# Patient Record
Sex: Female | Born: 1960 | Race: White | Hispanic: No | Smoking: Current every day smoker
Health system: Southern US, Community
[De-identification: ages and names within clinical notes are randomized; demographics above are authoritative.]

## PROBLEM LIST (undated history)

## (undated) DIAGNOSIS — S91301A Unspecified open wound, right foot, initial encounter: Secondary | ICD-10-CM

## (undated) DIAGNOSIS — M1990 Unspecified osteoarthritis, unspecified site: Secondary | ICD-10-CM

## (undated) DIAGNOSIS — L97509 Non-pressure chronic ulcer of other part of unspecified foot with unspecified severity: Secondary | ICD-10-CM

## (undated) DIAGNOSIS — F419 Anxiety disorder, unspecified: Secondary | ICD-10-CM

## (undated) DIAGNOSIS — E11621 Type 2 diabetes mellitus with foot ulcer: Secondary | ICD-10-CM

## (undated) DIAGNOSIS — I10 Essential (primary) hypertension: Secondary | ICD-10-CM

## (undated) DIAGNOSIS — B958 Unspecified staphylococcus as the cause of diseases classified elsewhere: Secondary | ICD-10-CM

## (undated) DIAGNOSIS — E119 Type 2 diabetes mellitus without complications: Secondary | ICD-10-CM

## (undated) DIAGNOSIS — N159 Renal tubulo-interstitial disease, unspecified: Secondary | ICD-10-CM

## (undated) HISTORY — PX: JOINT REPLACEMENT: SHX530

## (undated) HISTORY — DX: Type 2 diabetes mellitus with foot ulcer: E11.621

## (undated) HISTORY — DX: Essential (primary) hypertension: I10

## (undated) HISTORY — DX: Anxiety disorder, unspecified: F41.9

## (undated) HISTORY — PX: ABDOMINAL ADHESION SURGERY: SHX90

## (undated) HISTORY — DX: Type 2 diabetes mellitus without complications: E11.9

## (undated) HISTORY — DX: Renal tubulo-interstitial disease, unspecified: N15.9

## (undated) HISTORY — PX: TONSILLECTOMY: SUR1361

## (undated) HISTORY — DX: Unspecified open wound, right foot, initial encounter: S91.301A

## (undated) HISTORY — DX: Non-pressure chronic ulcer of other part of unspecified foot with unspecified severity: L97.509

## (undated) HISTORY — PX: FOOT SURGERY: SHX648

## (undated) HISTORY — PX: CARPAL TUNNEL RELEASE: SHX101

## (undated) HISTORY — DX: Unspecified osteoarthritis, unspecified site: M19.90

---

## 1978-05-29 HISTORY — PX: TONSILLECTOMY: SHX5217

## 1979-05-30 HISTORY — PX: CHOLECYSTECTOMY: SHX55

## 1996-05-29 HISTORY — PX: TOTAL ABDOMINAL HYSTERECTOMY: SHX209

## 1996-05-29 HISTORY — PX: ABDOMINAL HYSTERECTOMY: SHX81

## 2001-06-04 HISTORY — PX: OTHER SURGICAL HISTORY: SHX169

## 2004-03-10 ENCOUNTER — Ambulatory Visit: Payer: Self-pay | Admitting: Pain Medicine

## 2004-04-05 ENCOUNTER — Ambulatory Visit: Payer: Self-pay | Admitting: Pain Medicine

## 2004-05-03 ENCOUNTER — Ambulatory Visit: Payer: Self-pay | Admitting: Pain Medicine

## 2004-06-07 ENCOUNTER — Ambulatory Visit: Payer: Self-pay | Admitting: Pain Medicine

## 2004-06-17 ENCOUNTER — Emergency Department: Payer: Self-pay | Admitting: Emergency Medicine

## 2004-07-05 ENCOUNTER — Ambulatory Visit: Payer: Self-pay | Admitting: Pain Medicine

## 2004-07-13 ENCOUNTER — Ambulatory Visit: Payer: Self-pay | Admitting: Pain Medicine

## 2004-08-02 ENCOUNTER — Ambulatory Visit: Payer: Self-pay | Admitting: Pain Medicine

## 2004-08-30 ENCOUNTER — Ambulatory Visit: Payer: Self-pay | Admitting: Pain Medicine

## 2004-09-29 ENCOUNTER — Ambulatory Visit: Payer: Self-pay | Admitting: Pain Medicine

## 2004-10-26 ENCOUNTER — Ambulatory Visit: Payer: Self-pay | Admitting: Pain Medicine

## 2004-11-24 ENCOUNTER — Ambulatory Visit: Payer: Self-pay | Admitting: Pain Medicine

## 2004-12-27 ENCOUNTER — Ambulatory Visit: Payer: Self-pay | Admitting: Pain Medicine

## 2005-01-19 ENCOUNTER — Ambulatory Visit: Payer: Self-pay | Admitting: Pain Medicine

## 2005-02-23 ENCOUNTER — Ambulatory Visit: Payer: Self-pay | Admitting: Pain Medicine

## 2005-03-09 ENCOUNTER — Ambulatory Visit: Payer: Self-pay | Admitting: Pain Medicine

## 2005-04-18 ENCOUNTER — Ambulatory Visit: Payer: Self-pay | Admitting: Pain Medicine

## 2005-05-16 ENCOUNTER — Ambulatory Visit: Payer: Self-pay | Admitting: Pain Medicine

## 2005-06-13 ENCOUNTER — Ambulatory Visit: Payer: Self-pay | Admitting: Pain Medicine

## 2005-06-25 ENCOUNTER — Emergency Department: Payer: Self-pay | Admitting: Emergency Medicine

## 2005-07-13 ENCOUNTER — Ambulatory Visit: Payer: Self-pay | Admitting: Pain Medicine

## 2005-08-10 ENCOUNTER — Ambulatory Visit: Payer: Self-pay | Admitting: Pain Medicine

## 2005-09-05 ENCOUNTER — Ambulatory Visit: Payer: Self-pay | Admitting: Pain Medicine

## 2005-10-05 ENCOUNTER — Ambulatory Visit: Payer: Self-pay | Admitting: Pain Medicine

## 2005-11-02 ENCOUNTER — Ambulatory Visit: Payer: Self-pay | Admitting: Pain Medicine

## 2005-12-05 ENCOUNTER — Ambulatory Visit: Payer: Self-pay | Admitting: Pain Medicine

## 2005-12-28 ENCOUNTER — Ambulatory Visit: Payer: Self-pay | Admitting: Pain Medicine

## 2006-02-01 ENCOUNTER — Ambulatory Visit: Payer: Self-pay | Admitting: Pain Medicine

## 2006-02-20 ENCOUNTER — Ambulatory Visit: Payer: Self-pay | Admitting: Pain Medicine

## 2006-03-29 ENCOUNTER — Ambulatory Visit: Payer: Self-pay | Admitting: Pain Medicine

## 2006-05-01 ENCOUNTER — Ambulatory Visit: Payer: Self-pay | Admitting: Pain Medicine

## 2006-05-31 ENCOUNTER — Ambulatory Visit: Payer: Self-pay | Admitting: Pain Medicine

## 2006-06-26 ENCOUNTER — Ambulatory Visit: Payer: Self-pay | Admitting: Pain Medicine

## 2006-07-26 ENCOUNTER — Ambulatory Visit: Payer: Self-pay | Admitting: Pain Medicine

## 2006-08-23 ENCOUNTER — Ambulatory Visit: Payer: Self-pay | Admitting: Pain Medicine

## 2006-09-25 ENCOUNTER — Ambulatory Visit: Payer: Self-pay | Admitting: Pain Medicine

## 2006-10-23 ENCOUNTER — Ambulatory Visit: Payer: Self-pay | Admitting: Pain Medicine

## 2006-11-22 ENCOUNTER — Ambulatory Visit: Payer: Self-pay | Admitting: Pain Medicine

## 2006-12-18 ENCOUNTER — Ambulatory Visit: Payer: Self-pay | Admitting: Pain Medicine

## 2007-01-17 ENCOUNTER — Ambulatory Visit: Payer: Self-pay | Admitting: Pain Medicine

## 2007-02-21 ENCOUNTER — Ambulatory Visit: Payer: Self-pay | Admitting: Pain Medicine

## 2007-03-19 ENCOUNTER — Ambulatory Visit: Payer: Self-pay | Admitting: Pain Medicine

## 2007-04-23 ENCOUNTER — Ambulatory Visit: Payer: Self-pay | Admitting: Pain Medicine

## 2007-05-28 ENCOUNTER — Ambulatory Visit: Payer: Self-pay | Admitting: Pain Medicine

## 2007-06-27 ENCOUNTER — Ambulatory Visit: Payer: Self-pay | Admitting: Pain Medicine

## 2007-07-07 ENCOUNTER — Other Ambulatory Visit: Payer: Self-pay

## 2007-07-07 ENCOUNTER — Emergency Department: Payer: Self-pay | Admitting: Emergency Medicine

## 2007-07-10 ENCOUNTER — Emergency Department: Payer: Self-pay | Admitting: Emergency Medicine

## 2007-07-25 ENCOUNTER — Ambulatory Visit: Payer: Self-pay | Admitting: Pain Medicine

## 2007-08-22 ENCOUNTER — Ambulatory Visit: Payer: Self-pay | Admitting: Pain Medicine

## 2007-09-26 ENCOUNTER — Ambulatory Visit: Payer: Self-pay | Admitting: Pain Medicine

## 2007-10-22 ENCOUNTER — Ambulatory Visit: Payer: Self-pay | Admitting: Pain Medicine

## 2007-10-30 ENCOUNTER — Ambulatory Visit: Payer: Self-pay | Admitting: Pain Medicine

## 2007-11-13 ENCOUNTER — Ambulatory Visit: Payer: Self-pay | Admitting: Pain Medicine

## 2007-11-21 ENCOUNTER — Ambulatory Visit: Payer: Self-pay | Admitting: Pain Medicine

## 2007-12-24 ENCOUNTER — Ambulatory Visit: Payer: Self-pay | Admitting: Pain Medicine

## 2008-01-21 ENCOUNTER — Ambulatory Visit: Payer: Self-pay | Admitting: Pain Medicine

## 2008-02-20 ENCOUNTER — Ambulatory Visit: Payer: Self-pay | Admitting: Pain Medicine

## 2008-02-22 ENCOUNTER — Emergency Department: Payer: Self-pay | Admitting: Emergency Medicine

## 2008-03-24 ENCOUNTER — Ambulatory Visit: Payer: Self-pay | Admitting: Pain Medicine

## 2008-04-21 ENCOUNTER — Ambulatory Visit: Payer: Self-pay | Admitting: Pain Medicine

## 2008-05-19 ENCOUNTER — Ambulatory Visit: Payer: Self-pay | Admitting: Pain Medicine

## 2008-06-18 ENCOUNTER — Ambulatory Visit: Payer: Self-pay | Admitting: Pain Medicine

## 2008-07-21 ENCOUNTER — Ambulatory Visit: Payer: Self-pay | Admitting: Pain Medicine

## 2008-08-18 ENCOUNTER — Ambulatory Visit: Payer: Self-pay | Admitting: Pain Medicine

## 2008-09-17 ENCOUNTER — Ambulatory Visit: Payer: Self-pay | Admitting: Pain Medicine

## 2008-10-13 ENCOUNTER — Ambulatory Visit: Payer: Self-pay | Admitting: Pain Medicine

## 2008-11-12 ENCOUNTER — Ambulatory Visit: Payer: Self-pay | Admitting: Pain Medicine

## 2008-12-15 ENCOUNTER — Ambulatory Visit: Payer: Self-pay | Admitting: Pain Medicine

## 2009-01-14 ENCOUNTER — Ambulatory Visit: Payer: Self-pay | Admitting: Pain Medicine

## 2009-02-16 ENCOUNTER — Ambulatory Visit: Payer: Self-pay | Admitting: Pain Medicine

## 2009-03-11 ENCOUNTER — Ambulatory Visit: Payer: Self-pay | Admitting: Pain Medicine

## 2009-04-05 ENCOUNTER — Emergency Department: Payer: Self-pay | Admitting: Emergency Medicine

## 2009-04-13 ENCOUNTER — Ambulatory Visit: Payer: Self-pay | Admitting: Pain Medicine

## 2009-05-11 ENCOUNTER — Ambulatory Visit: Payer: Self-pay | Admitting: Pain Medicine

## 2009-06-10 ENCOUNTER — Ambulatory Visit: Payer: Self-pay | Admitting: Pain Medicine

## 2009-07-08 ENCOUNTER — Ambulatory Visit: Payer: Self-pay | Admitting: Pain Medicine

## 2009-08-04 ENCOUNTER — Ambulatory Visit: Payer: Self-pay | Admitting: Pain Medicine

## 2009-09-02 ENCOUNTER — Ambulatory Visit: Payer: Self-pay | Admitting: Pain Medicine

## 2009-10-05 ENCOUNTER — Ambulatory Visit: Payer: Self-pay | Admitting: Pain Medicine

## 2009-10-09 ENCOUNTER — Emergency Department: Payer: Self-pay | Admitting: Emergency Medicine

## 2009-11-02 ENCOUNTER — Ambulatory Visit: Payer: Self-pay | Admitting: Pain Medicine

## 2009-12-02 ENCOUNTER — Ambulatory Visit: Payer: Self-pay | Admitting: Pain Medicine

## 2009-12-30 ENCOUNTER — Ambulatory Visit: Payer: Self-pay | Admitting: Pain Medicine

## 2010-01-27 ENCOUNTER — Emergency Department: Payer: Self-pay | Admitting: Emergency Medicine

## 2010-01-27 ENCOUNTER — Ambulatory Visit: Payer: Self-pay | Admitting: Pain Medicine

## 2010-03-01 ENCOUNTER — Ambulatory Visit: Payer: Self-pay | Admitting: Pain Medicine

## 2010-03-25 ENCOUNTER — Emergency Department: Payer: Self-pay | Admitting: Emergency Medicine

## 2010-03-31 ENCOUNTER — Ambulatory Visit: Payer: Self-pay | Admitting: Pain Medicine

## 2010-04-28 ENCOUNTER — Ambulatory Visit: Payer: Self-pay | Admitting: Pain Medicine

## 2010-05-31 ENCOUNTER — Ambulatory Visit: Payer: Self-pay | Admitting: Pain Medicine

## 2010-06-30 ENCOUNTER — Ambulatory Visit: Payer: Self-pay | Admitting: Pain Medicine

## 2010-07-28 ENCOUNTER — Ambulatory Visit: Payer: Self-pay | Admitting: Pain Medicine

## 2010-08-30 ENCOUNTER — Ambulatory Visit: Payer: Self-pay | Admitting: Pain Medicine

## 2010-09-29 ENCOUNTER — Ambulatory Visit: Payer: Self-pay | Admitting: Pain Medicine

## 2010-10-27 ENCOUNTER — Ambulatory Visit: Payer: Self-pay | Admitting: Pain Medicine

## 2010-11-29 ENCOUNTER — Ambulatory Visit: Payer: Self-pay | Admitting: Pain Medicine

## 2010-12-27 ENCOUNTER — Ambulatory Visit: Payer: Self-pay | Admitting: Pain Medicine

## 2011-01-26 ENCOUNTER — Ambulatory Visit: Payer: Self-pay | Admitting: Pain Medicine

## 2011-02-23 ENCOUNTER — Ambulatory Visit: Payer: Self-pay | Admitting: Pain Medicine

## 2011-03-28 ENCOUNTER — Ambulatory Visit: Payer: Self-pay | Admitting: Pain Medicine

## 2011-04-27 ENCOUNTER — Ambulatory Visit: Payer: Self-pay | Admitting: Pain Medicine

## 2011-05-16 ENCOUNTER — Ambulatory Visit: Payer: Self-pay | Admitting: Pain Medicine

## 2011-06-22 ENCOUNTER — Ambulatory Visit: Payer: Self-pay | Admitting: Pain Medicine

## 2011-07-25 ENCOUNTER — Ambulatory Visit: Payer: Self-pay | Admitting: Pain Medicine

## 2011-08-22 ENCOUNTER — Ambulatory Visit: Payer: Self-pay | Admitting: Pain Medicine

## 2011-08-27 ENCOUNTER — Emergency Department: Payer: Self-pay | Admitting: Emergency Medicine

## 2011-08-27 LAB — URINALYSIS, COMPLETE
Bilirubin,UR: NEGATIVE
Blood: NEGATIVE
Glucose,UR: 50 mg/dL (ref 0–75)
Leukocyte Esterase: NEGATIVE
Nitrite: NEGATIVE
Protein: 25
RBC,UR: 3 /HPF (ref 0–5)
Squamous Epithelial: 1
WBC UR: 1 /HPF (ref 0–5)

## 2011-09-21 ENCOUNTER — Ambulatory Visit: Payer: Self-pay | Admitting: Pain Medicine

## 2011-10-16 ENCOUNTER — Emergency Department: Payer: Self-pay | Admitting: *Deleted

## 2011-10-16 LAB — URINALYSIS, COMPLETE
Bilirubin,UR: NEGATIVE
Glucose,UR: NEGATIVE mg/dL (ref 0–75)
Nitrite: NEGATIVE
Ph: 8 (ref 4.5–8.0)
Protein: 30
Specific Gravity: 1.01 (ref 1.003–1.030)
WBC UR: 52 /HPF (ref 0–5)

## 2011-10-19 ENCOUNTER — Ambulatory Visit: Payer: Self-pay | Admitting: Pain Medicine

## 2011-11-21 ENCOUNTER — Ambulatory Visit: Payer: Self-pay | Admitting: Pain Medicine

## 2011-12-21 ENCOUNTER — Ambulatory Visit: Payer: Self-pay | Admitting: Pain Medicine

## 2012-01-18 ENCOUNTER — Ambulatory Visit: Payer: Self-pay | Admitting: Pain Medicine

## 2012-02-06 ENCOUNTER — Inpatient Hospital Stay: Payer: Self-pay | Admitting: Internal Medicine

## 2012-02-06 DIAGNOSIS — Z888 Allergy status to other drugs, medicaments and biological substances status: Secondary | ICD-10-CM | POA: Diagnosis not present

## 2012-02-06 DIAGNOSIS — Z9889 Other specified postprocedural states: Secondary | ICD-10-CM | POA: Diagnosis not present

## 2012-02-06 DIAGNOSIS — M47817 Spondylosis without myelopathy or radiculopathy, lumbosacral region: Secondary | ICD-10-CM | POA: Diagnosis present

## 2012-02-06 DIAGNOSIS — M169 Osteoarthritis of hip, unspecified: Secondary | ICD-10-CM | POA: Diagnosis present

## 2012-02-06 DIAGNOSIS — G894 Chronic pain syndrome: Secondary | ICD-10-CM | POA: Diagnosis present

## 2012-02-06 DIAGNOSIS — D72829 Elevated white blood cell count, unspecified: Secondary | ICD-10-CM | POA: Diagnosis present

## 2012-02-06 DIAGNOSIS — A498 Other bacterial infections of unspecified site: Secondary | ICD-10-CM | POA: Diagnosis not present

## 2012-02-06 DIAGNOSIS — N390 Urinary tract infection, site not specified: Secondary | ICD-10-CM | POA: Diagnosis not present

## 2012-02-06 DIAGNOSIS — Z882 Allergy status to sulfonamides status: Secondary | ICD-10-CM | POA: Diagnosis not present

## 2012-02-06 DIAGNOSIS — Z7982 Long term (current) use of aspirin: Secondary | ICD-10-CM | POA: Diagnosis not present

## 2012-02-06 DIAGNOSIS — Z79899 Other long term (current) drug therapy: Secondary | ICD-10-CM | POA: Diagnosis not present

## 2012-02-06 DIAGNOSIS — R002 Palpitations: Secondary | ICD-10-CM | POA: Diagnosis present

## 2012-02-06 DIAGNOSIS — E669 Obesity, unspecified: Secondary | ICD-10-CM | POA: Diagnosis not present

## 2012-02-06 DIAGNOSIS — Z794 Long term (current) use of insulin: Secondary | ICD-10-CM | POA: Diagnosis not present

## 2012-02-06 DIAGNOSIS — E119 Type 2 diabetes mellitus without complications: Secondary | ICD-10-CM | POA: Diagnosis not present

## 2012-02-06 DIAGNOSIS — F17200 Nicotine dependence, unspecified, uncomplicated: Secondary | ICD-10-CM | POA: Diagnosis present

## 2012-02-06 DIAGNOSIS — Z833 Family history of diabetes mellitus: Secondary | ICD-10-CM | POA: Diagnosis not present

## 2012-02-06 DIAGNOSIS — M1710 Unilateral primary osteoarthritis, unspecified knee: Secondary | ICD-10-CM | POA: Diagnosis present

## 2012-02-06 DIAGNOSIS — Z836 Family history of other diseases of the respiratory system: Secondary | ICD-10-CM | POA: Diagnosis not present

## 2012-02-06 DIAGNOSIS — Z96649 Presence of unspecified artificial hip joint: Secondary | ICD-10-CM | POA: Diagnosis not present

## 2012-02-06 DIAGNOSIS — Z90710 Acquired absence of both cervix and uterus: Secondary | ICD-10-CM | POA: Diagnosis not present

## 2012-02-06 DIAGNOSIS — E785 Hyperlipidemia, unspecified: Secondary | ICD-10-CM | POA: Diagnosis not present

## 2012-02-06 DIAGNOSIS — Z6838 Body mass index (BMI) 38.0-38.9, adult: Secondary | ICD-10-CM | POA: Diagnosis not present

## 2012-02-06 DIAGNOSIS — I248 Other forms of acute ischemic heart disease: Secondary | ICD-10-CM | POA: Diagnosis not present

## 2012-02-06 DIAGNOSIS — R112 Nausea with vomiting, unspecified: Secondary | ICD-10-CM | POA: Diagnosis not present

## 2012-02-06 LAB — COMPREHENSIVE METABOLIC PANEL
Albumin: 3.5 g/dL (ref 3.4–5.0)
BUN: 11 mg/dL (ref 7–18)
Calcium, Total: 9.1 mg/dL (ref 8.5–10.1)
Chloride: 101 mmol/L (ref 98–107)
Co2: 30 mmol/L (ref 21–32)
EGFR (African American): >60
Glucose: 185 mg/dL — ABNORMAL HIGH (ref 65–99)
Potassium: 3.9 mmol/L (ref 3.5–5.1)
SGOT(AST): 17 U/L (ref 15–37)
SGPT (ALT): 25 U/L (ref 12–78)
Sodium: 139 mmol/L (ref 136–145)
Total Protein: 8.3 g/dL — ABNORMAL HIGH (ref 6.4–8.2)

## 2012-02-06 LAB — CBC
HCT: 47.5 % — ABNORMAL HIGH (ref 35.0–47.0)
MCH: 27.6 pg (ref 26.0–34.0)
MCV: 83 fL (ref 80–100)
Platelet: 151 10*3/uL (ref 150–440)
RDW: 15.7 % — ABNORMAL HIGH (ref 11.5–14.5)
WBC: 13.3 10*3/uL — ABNORMAL HIGH (ref 3.6–11.0)

## 2012-02-06 LAB — LIPASE, BLOOD: Lipase: 43 U/L — ABNORMAL LOW (ref 73–393)

## 2012-02-06 LAB — URINALYSIS, COMPLETE
Ketone: NEGATIVE
Nitrite: NEGATIVE
RBC,UR: 3 /HPF (ref 0–5)
Squamous Epithelial: 5
WBC UR: 942 /HPF (ref 0–5)

## 2012-02-06 LAB — TROPONIN I: Troponin-I: 0.12 ng/mL — ABNORMAL HIGH

## 2012-02-07 DIAGNOSIS — R079 Chest pain, unspecified: Secondary | ICD-10-CM

## 2012-02-07 DIAGNOSIS — R748 Abnormal levels of other serum enzymes: Secondary | ICD-10-CM

## 2012-02-07 LAB — LIPID PANEL
Cholesterol: 150 mg/dL (ref 0–200)
HDL Cholesterol: 14 mg/dL — ABNORMAL LOW (ref 40–60)
Ldl Cholesterol, Calc: 80 mg/dL (ref 0–100)
Triglycerides: 279 mg/dL — ABNORMAL HIGH (ref 0–200)
VLDL Cholesterol, Calc: 56 mg/dL — ABNORMAL HIGH (ref 5–40)

## 2012-02-07 LAB — TROPONIN I
Troponin-I: 0.12 ng/mL — ABNORMAL HIGH
Troponin-I: 0.1 ng/mL — ABNORMAL HIGH

## 2012-02-07 LAB — CK TOTAL AND CKMB (NOT AT ARMC): CK, Total: 89 U/L (ref 21–215)

## 2012-02-07 LAB — APTT: Activated PTT: 54.7 secs — ABNORMAL HIGH (ref 23.6–35.9)

## 2012-02-08 LAB — URINE CULTURE

## 2012-02-20 ENCOUNTER — Ambulatory Visit: Payer: Self-pay | Admitting: Pain Medicine

## 2012-03-21 ENCOUNTER — Ambulatory Visit: Payer: Self-pay | Admitting: Pain Medicine

## 2012-04-18 ENCOUNTER — Ambulatory Visit: Payer: Self-pay | Admitting: Pain Medicine

## 2012-04-29 ENCOUNTER — Emergency Department: Payer: Self-pay | Admitting: Emergency Medicine

## 2012-05-16 ENCOUNTER — Ambulatory Visit: Payer: Self-pay | Admitting: Pain Medicine

## 2012-06-18 ENCOUNTER — Ambulatory Visit: Payer: Self-pay | Admitting: Pain Medicine

## 2012-07-01 ENCOUNTER — Emergency Department: Payer: Self-pay | Admitting: Emergency Medicine

## 2012-07-18 ENCOUNTER — Ambulatory Visit: Payer: Self-pay | Admitting: Pain Medicine

## 2012-08-13 ENCOUNTER — Ambulatory Visit: Payer: Self-pay | Admitting: Pain Medicine

## 2012-08-13 DIAGNOSIS — IMO0002 Reserved for concepts with insufficient information to code with codable children: Secondary | ICD-10-CM | POA: Diagnosis not present

## 2012-08-13 DIAGNOSIS — S83289A Other tear of lateral meniscus, current injury, unspecified knee, initial encounter: Secondary | ICD-10-CM | POA: Diagnosis not present

## 2012-08-13 DIAGNOSIS — M47817 Spondylosis without myelopathy or radiculopathy, lumbosacral region: Secondary | ICD-10-CM | POA: Diagnosis not present

## 2012-08-13 DIAGNOSIS — E1149 Type 2 diabetes mellitus with other diabetic neurological complication: Secondary | ICD-10-CM | POA: Diagnosis not present

## 2012-08-13 DIAGNOSIS — E1142 Type 2 diabetes mellitus with diabetic polyneuropathy: Secondary | ICD-10-CM | POA: Diagnosis not present

## 2012-08-13 DIAGNOSIS — M2240 Chondromalacia patellae, unspecified knee: Secondary | ICD-10-CM | POA: Diagnosis not present

## 2012-08-13 DIAGNOSIS — M25569 Pain in unspecified knee: Secondary | ICD-10-CM | POA: Diagnosis not present

## 2012-08-13 DIAGNOSIS — Z79899 Other long term (current) drug therapy: Secondary | ICD-10-CM | POA: Diagnosis not present

## 2012-08-13 DIAGNOSIS — M79609 Pain in unspecified limb: Secondary | ICD-10-CM | POA: Diagnosis not present

## 2012-08-13 DIAGNOSIS — Z96649 Presence of unspecified artificial hip joint: Secondary | ICD-10-CM | POA: Diagnosis not present

## 2012-08-27 DIAGNOSIS — R002 Palpitations: Secondary | ICD-10-CM | POA: Diagnosis not present

## 2012-08-27 DIAGNOSIS — J019 Acute sinusitis, unspecified: Secondary | ICD-10-CM | POA: Diagnosis not present

## 2012-08-27 DIAGNOSIS — E119 Type 2 diabetes mellitus without complications: Secondary | ICD-10-CM | POA: Diagnosis not present

## 2012-08-27 DIAGNOSIS — B372 Candidiasis of skin and nail: Secondary | ICD-10-CM | POA: Diagnosis not present

## 2012-09-12 ENCOUNTER — Ambulatory Visit: Payer: Self-pay | Admitting: Pain Medicine

## 2012-09-12 DIAGNOSIS — E1142 Type 2 diabetes mellitus with diabetic polyneuropathy: Secondary | ICD-10-CM | POA: Diagnosis not present

## 2012-09-12 DIAGNOSIS — M169 Osteoarthritis of hip, unspecified: Secondary | ICD-10-CM | POA: Diagnosis not present

## 2012-09-12 DIAGNOSIS — M5137 Other intervertebral disc degeneration, lumbosacral region: Secondary | ICD-10-CM | POA: Diagnosis not present

## 2012-09-12 DIAGNOSIS — Z96649 Presence of unspecified artificial hip joint: Secondary | ICD-10-CM | POA: Diagnosis not present

## 2012-09-12 DIAGNOSIS — IMO0002 Reserved for concepts with insufficient information to code with codable children: Secondary | ICD-10-CM | POA: Diagnosis not present

## 2012-09-12 DIAGNOSIS — S83289A Other tear of lateral meniscus, current injury, unspecified knee, initial encounter: Secondary | ICD-10-CM | POA: Diagnosis not present

## 2012-09-12 DIAGNOSIS — M25569 Pain in unspecified knee: Secondary | ICD-10-CM | POA: Diagnosis not present

## 2012-09-12 DIAGNOSIS — M47817 Spondylosis without myelopathy or radiculopathy, lumbosacral region: Secondary | ICD-10-CM | POA: Diagnosis not present

## 2012-09-12 DIAGNOSIS — Z79899 Other long term (current) drug therapy: Secondary | ICD-10-CM | POA: Diagnosis not present

## 2012-09-12 DIAGNOSIS — M62838 Other muscle spasm: Secondary | ICD-10-CM | POA: Diagnosis not present

## 2012-09-12 DIAGNOSIS — E1149 Type 2 diabetes mellitus with other diabetic neurological complication: Secondary | ICD-10-CM | POA: Diagnosis not present

## 2012-09-12 DIAGNOSIS — M2240 Chondromalacia patellae, unspecified knee: Secondary | ICD-10-CM | POA: Diagnosis not present

## 2012-09-12 DIAGNOSIS — M79609 Pain in unspecified limb: Secondary | ICD-10-CM | POA: Diagnosis not present

## 2012-10-15 ENCOUNTER — Ambulatory Visit: Payer: Self-pay | Admitting: Pain Medicine

## 2012-10-15 DIAGNOSIS — M25569 Pain in unspecified knee: Secondary | ICD-10-CM | POA: Diagnosis not present

## 2012-10-15 DIAGNOSIS — IMO0002 Reserved for concepts with insufficient information to code with codable children: Secondary | ICD-10-CM | POA: Diagnosis not present

## 2012-10-15 DIAGNOSIS — M47817 Spondylosis without myelopathy or radiculopathy, lumbosacral region: Secondary | ICD-10-CM | POA: Diagnosis not present

## 2012-10-15 DIAGNOSIS — Z96649 Presence of unspecified artificial hip joint: Secondary | ICD-10-CM | POA: Diagnosis not present

## 2012-10-15 DIAGNOSIS — M79609 Pain in unspecified limb: Secondary | ICD-10-CM | POA: Diagnosis not present

## 2012-10-15 DIAGNOSIS — S83289A Other tear of lateral meniscus, current injury, unspecified knee, initial encounter: Secondary | ICD-10-CM | POA: Diagnosis not present

## 2012-10-15 DIAGNOSIS — M5137 Other intervertebral disc degeneration, lumbosacral region: Secondary | ICD-10-CM | POA: Diagnosis not present

## 2012-10-15 DIAGNOSIS — E1142 Type 2 diabetes mellitus with diabetic polyneuropathy: Secondary | ICD-10-CM | POA: Diagnosis not present

## 2012-10-15 DIAGNOSIS — M2240 Chondromalacia patellae, unspecified knee: Secondary | ICD-10-CM | POA: Diagnosis not present

## 2012-10-15 DIAGNOSIS — Z79899 Other long term (current) drug therapy: Secondary | ICD-10-CM | POA: Diagnosis not present

## 2012-10-15 DIAGNOSIS — G5600 Carpal tunnel syndrome, unspecified upper limb: Secondary | ICD-10-CM | POA: Diagnosis not present

## 2012-10-15 DIAGNOSIS — E1149 Type 2 diabetes mellitus with other diabetic neurological complication: Secondary | ICD-10-CM | POA: Diagnosis not present

## 2012-11-01 DIAGNOSIS — R002 Palpitations: Secondary | ICD-10-CM | POA: Diagnosis not present

## 2012-11-01 DIAGNOSIS — Z0000 Encounter for general adult medical examination without abnormal findings: Secondary | ICD-10-CM | POA: Diagnosis not present

## 2012-11-01 DIAGNOSIS — E119 Type 2 diabetes mellitus without complications: Secondary | ICD-10-CM | POA: Diagnosis not present

## 2012-11-01 DIAGNOSIS — J019 Acute sinusitis, unspecified: Secondary | ICD-10-CM | POA: Diagnosis not present

## 2012-11-08 DIAGNOSIS — E119 Type 2 diabetes mellitus without complications: Secondary | ICD-10-CM | POA: Diagnosis not present

## 2012-11-08 DIAGNOSIS — Z136 Encounter for screening for cardiovascular disorders: Secondary | ICD-10-CM | POA: Diagnosis not present

## 2012-11-08 DIAGNOSIS — Z1159 Encounter for screening for other viral diseases: Secondary | ICD-10-CM | POA: Diagnosis not present

## 2012-11-08 DIAGNOSIS — N644 Mastodynia: Secondary | ICD-10-CM | POA: Diagnosis not present

## 2012-11-08 DIAGNOSIS — Z0000 Encounter for general adult medical examination without abnormal findings: Secondary | ICD-10-CM | POA: Diagnosis not present

## 2012-11-08 DIAGNOSIS — I10 Essential (primary) hypertension: Secondary | ICD-10-CM | POA: Diagnosis not present

## 2012-11-14 ENCOUNTER — Ambulatory Visit: Payer: Self-pay | Admitting: Pain Medicine

## 2012-11-14 DIAGNOSIS — G5600 Carpal tunnel syndrome, unspecified upper limb: Secondary | ICD-10-CM | POA: Diagnosis not present

## 2012-11-14 DIAGNOSIS — M2240 Chondromalacia patellae, unspecified knee: Secondary | ICD-10-CM | POA: Diagnosis not present

## 2012-11-14 DIAGNOSIS — IMO0002 Reserved for concepts with insufficient information to code with codable children: Secondary | ICD-10-CM | POA: Diagnosis not present

## 2012-11-14 DIAGNOSIS — M25569 Pain in unspecified knee: Secondary | ICD-10-CM | POA: Diagnosis not present

## 2012-11-14 DIAGNOSIS — M47817 Spondylosis without myelopathy or radiculopathy, lumbosacral region: Secondary | ICD-10-CM | POA: Diagnosis not present

## 2012-11-14 DIAGNOSIS — E119 Type 2 diabetes mellitus without complications: Secondary | ICD-10-CM | POA: Diagnosis not present

## 2012-11-14 DIAGNOSIS — M545 Low back pain, unspecified: Secondary | ICD-10-CM | POA: Diagnosis not present

## 2012-11-14 DIAGNOSIS — M79609 Pain in unspecified limb: Secondary | ICD-10-CM | POA: Diagnosis not present

## 2012-11-14 DIAGNOSIS — Z96649 Presence of unspecified artificial hip joint: Secondary | ICD-10-CM | POA: Diagnosis not present

## 2012-11-22 DIAGNOSIS — E119 Type 2 diabetes mellitus without complications: Secondary | ICD-10-CM | POA: Diagnosis not present

## 2012-11-22 DIAGNOSIS — L089 Local infection of the skin and subcutaneous tissue, unspecified: Secondary | ICD-10-CM | POA: Diagnosis not present

## 2012-12-05 DIAGNOSIS — E119 Type 2 diabetes mellitus without complications: Secondary | ICD-10-CM | POA: Diagnosis not present

## 2012-12-05 DIAGNOSIS — L089 Local infection of the skin and subcutaneous tissue, unspecified: Secondary | ICD-10-CM | POA: Diagnosis not present

## 2012-12-10 ENCOUNTER — Ambulatory Visit: Payer: Self-pay | Admitting: Pain Medicine

## 2012-12-10 DIAGNOSIS — L97809 Non-pressure chronic ulcer of other part of unspecified lower leg with unspecified severity: Secondary | ICD-10-CM | POA: Diagnosis not present

## 2012-12-10 DIAGNOSIS — M79609 Pain in unspecified limb: Secondary | ICD-10-CM | POA: Diagnosis not present

## 2012-12-10 DIAGNOSIS — M5137 Other intervertebral disc degeneration, lumbosacral region: Secondary | ICD-10-CM | POA: Diagnosis not present

## 2012-12-10 DIAGNOSIS — Z96649 Presence of unspecified artificial hip joint: Secondary | ICD-10-CM | POA: Diagnosis not present

## 2012-12-10 DIAGNOSIS — IMO0002 Reserved for concepts with insufficient information to code with codable children: Secondary | ICD-10-CM | POA: Diagnosis not present

## 2012-12-10 DIAGNOSIS — M47817 Spondylosis without myelopathy or radiculopathy, lumbosacral region: Secondary | ICD-10-CM | POA: Diagnosis not present

## 2012-12-10 DIAGNOSIS — Z79899 Other long term (current) drug therapy: Secondary | ICD-10-CM | POA: Diagnosis not present

## 2012-12-10 DIAGNOSIS — M25569 Pain in unspecified knee: Secondary | ICD-10-CM | POA: Diagnosis not present

## 2012-12-10 DIAGNOSIS — E1169 Type 2 diabetes mellitus with other specified complication: Secondary | ICD-10-CM | POA: Diagnosis not present

## 2012-12-25 DIAGNOSIS — L089 Local infection of the skin and subcutaneous tissue, unspecified: Secondary | ICD-10-CM | POA: Diagnosis not present

## 2012-12-26 DIAGNOSIS — L02619 Cutaneous abscess of unspecified foot: Secondary | ICD-10-CM | POA: Diagnosis not present

## 2012-12-26 DIAGNOSIS — L97409 Non-pressure chronic ulcer of unspecified heel and midfoot with unspecified severity: Secondary | ICD-10-CM | POA: Diagnosis not present

## 2012-12-26 DIAGNOSIS — L03119 Cellulitis of unspecified part of limb: Secondary | ICD-10-CM | POA: Diagnosis not present

## 2012-12-28 ENCOUNTER — Emergency Department: Payer: Self-pay | Admitting: Emergency Medicine

## 2012-12-28 DIAGNOSIS — L97509 Non-pressure chronic ulcer of other part of unspecified foot with unspecified severity: Secondary | ICD-10-CM | POA: Diagnosis not present

## 2012-12-28 DIAGNOSIS — E1169 Type 2 diabetes mellitus with other specified complication: Secondary | ICD-10-CM | POA: Diagnosis not present

## 2012-12-28 DIAGNOSIS — Z79899 Other long term (current) drug therapy: Secondary | ICD-10-CM | POA: Diagnosis not present

## 2012-12-28 DIAGNOSIS — L98499 Non-pressure chronic ulcer of skin of other sites with unspecified severity: Secondary | ICD-10-CM | POA: Diagnosis not present

## 2012-12-28 DIAGNOSIS — M79609 Pain in unspecified limb: Secondary | ICD-10-CM | POA: Diagnosis not present

## 2012-12-28 LAB — CBC
HGB: 15.6 g/dL (ref 12.0–16.0)
MCHC: 34.9 g/dL (ref 32.0–36.0)
MCV: 83 fL (ref 80–100)
RDW: 15.3 % — ABNORMAL HIGH (ref 11.5–14.5)

## 2012-12-28 LAB — COMPREHENSIVE METABOLIC PANEL
Albumin: 3.6 g/dL (ref 3.4–5.0)
Alkaline Phosphatase: 90 U/L (ref 50–136)
Anion Gap: 4 — ABNORMAL LOW (ref 7–16)
BUN: 10 mg/dL (ref 7–18)
Bilirubin,Total: 0.4 mg/dL (ref 0.2–1.0)
Calcium, Total: 8.8 mg/dL (ref 8.5–10.1)
EGFR (African American): >60
EGFR (Non-African Amer.): >60
Osmolality: 277 (ref 275–301)
SGOT(AST): 23 U/L (ref 15–37)
Sodium: 135 mmol/L — ABNORMAL LOW (ref 136–145)

## 2012-12-28 LAB — URINALYSIS, COMPLETE
Bacteria: NONE SEEN
Blood: NEGATIVE
Nitrite: NEGATIVE
Ph: 5 (ref 4.5–8.0)
Protein: 100
RBC,UR: 3 /HPF (ref 0–5)
Specific Gravity: 1.029 (ref 1.003–1.030)
Squamous Epithelial: 3
WBC UR: 8 /HPF (ref 0–5)

## 2013-01-02 LAB — CULTURE, BLOOD (SINGLE)

## 2013-01-14 ENCOUNTER — Ambulatory Visit: Payer: Self-pay | Admitting: Pain Medicine

## 2013-01-14 DIAGNOSIS — Z79899 Other long term (current) drug therapy: Secondary | ICD-10-CM | POA: Diagnosis not present

## 2013-01-14 DIAGNOSIS — E119 Type 2 diabetes mellitus without complications: Secondary | ICD-10-CM | POA: Diagnosis not present

## 2013-01-14 DIAGNOSIS — M79609 Pain in unspecified limb: Secondary | ICD-10-CM | POA: Diagnosis not present

## 2013-01-14 DIAGNOSIS — Z96649 Presence of unspecified artificial hip joint: Secondary | ICD-10-CM | POA: Diagnosis not present

## 2013-01-14 DIAGNOSIS — L089 Local infection of the skin and subcutaneous tissue, unspecified: Secondary | ICD-10-CM | POA: Diagnosis not present

## 2013-01-14 DIAGNOSIS — IMO0002 Reserved for concepts with insufficient information to code with codable children: Secondary | ICD-10-CM | POA: Diagnosis not present

## 2013-01-14 DIAGNOSIS — M5137 Other intervertebral disc degeneration, lumbosacral region: Secondary | ICD-10-CM | POA: Diagnosis not present

## 2013-01-14 DIAGNOSIS — M25569 Pain in unspecified knee: Secondary | ICD-10-CM | POA: Diagnosis not present

## 2013-01-14 DIAGNOSIS — M47817 Spondylosis without myelopathy or radiculopathy, lumbosacral region: Secondary | ICD-10-CM | POA: Diagnosis not present

## 2013-01-14 DIAGNOSIS — G5600 Carpal tunnel syndrome, unspecified upper limb: Secondary | ICD-10-CM | POA: Diagnosis not present

## 2013-02-05 ENCOUNTER — Emergency Department: Payer: Self-pay

## 2013-02-05 DIAGNOSIS — Z90710 Acquired absence of both cervix and uterus: Secondary | ICD-10-CM | POA: Diagnosis not present

## 2013-02-05 DIAGNOSIS — N390 Urinary tract infection, site not specified: Secondary | ICD-10-CM | POA: Diagnosis not present

## 2013-02-05 DIAGNOSIS — N10 Acute tubulo-interstitial nephritis: Secondary | ICD-10-CM | POA: Diagnosis not present

## 2013-02-05 LAB — URINALYSIS, COMPLETE
Bilirubin,UR: NEGATIVE
Glucose,UR: 50 mg/dL
Hyaline Cast: 2
Ketone: NEGATIVE
Nitrite: POSITIVE
Ph: 5
Protein: 30
RBC,UR: 5 /HPF
Specific Gravity: 1.011
Squamous Epithelial: 3
WBC UR: 208 /HPF

## 2013-02-05 LAB — BASIC METABOLIC PANEL WITH GFR
Anion Gap: 3 — ABNORMAL LOW
BUN: 16 mg/dL
Calcium, Total: 8.9 mg/dL
Chloride: 98 mmol/L
Co2: 31 mmol/L
Creatinine: 0.88 mg/dL
EGFR (African American): >60
EGFR (Non-African Amer.): >60
Glucose: 223 mg/dL — ABNORMAL HIGH
Osmolality: 273
Potassium: 4.1 mmol/L
Sodium: 132 mmol/L — ABNORMAL LOW

## 2013-02-05 LAB — CBC
HCT: 41.7 % (ref 35.0–47.0)
MCH: 28 pg (ref 26.0–34.0)
MCHC: 33.9 g/dL (ref 32.0–36.0)
MCV: 83 fL (ref 80–100)
RBC: 5.04 10*6/uL (ref 3.80–5.20)
RDW: 15.6 % — ABNORMAL HIGH (ref 11.5–14.5)
WBC: 15.9 10*3/uL — ABNORMAL HIGH (ref 3.6–11.0)

## 2013-02-07 LAB — URINE CULTURE

## 2013-02-08 LAB — CULTURE, BLOOD (SINGLE)

## 2013-02-12 ENCOUNTER — Ambulatory Visit: Payer: Self-pay | Admitting: Pain Medicine

## 2013-02-12 DIAGNOSIS — L089 Local infection of the skin and subcutaneous tissue, unspecified: Secondary | ICD-10-CM | POA: Diagnosis not present

## 2013-02-12 DIAGNOSIS — M2240 Chondromalacia patellae, unspecified knee: Secondary | ICD-10-CM | POA: Diagnosis not present

## 2013-02-12 DIAGNOSIS — M25569 Pain in unspecified knee: Secondary | ICD-10-CM | POA: Diagnosis not present

## 2013-02-12 DIAGNOSIS — S83289A Other tear of lateral meniscus, current injury, unspecified knee, initial encounter: Secondary | ICD-10-CM | POA: Diagnosis not present

## 2013-02-12 DIAGNOSIS — Z96649 Presence of unspecified artificial hip joint: Secondary | ICD-10-CM | POA: Diagnosis not present

## 2013-02-12 DIAGNOSIS — M47817 Spondylosis without myelopathy or radiculopathy, lumbosacral region: Secondary | ICD-10-CM | POA: Diagnosis not present

## 2013-02-12 DIAGNOSIS — G5600 Carpal tunnel syndrome, unspecified upper limb: Secondary | ICD-10-CM | POA: Diagnosis not present

## 2013-02-12 DIAGNOSIS — IMO0002 Reserved for concepts with insufficient information to code with codable children: Secondary | ICD-10-CM | POA: Diagnosis not present

## 2013-02-12 DIAGNOSIS — M79609 Pain in unspecified limb: Secondary | ICD-10-CM | POA: Diagnosis not present

## 2013-02-12 DIAGNOSIS — M5137 Other intervertebral disc degeneration, lumbosacral region: Secondary | ICD-10-CM | POA: Diagnosis not present

## 2013-02-12 DIAGNOSIS — Z87440 Personal history of urinary (tract) infections: Secondary | ICD-10-CM | POA: Diagnosis not present

## 2013-02-12 DIAGNOSIS — Z79899 Other long term (current) drug therapy: Secondary | ICD-10-CM | POA: Diagnosis not present

## 2013-03-13 ENCOUNTER — Ambulatory Visit: Payer: Self-pay | Admitting: Pain Medicine

## 2013-03-13 DIAGNOSIS — M2240 Chondromalacia patellae, unspecified knee: Secondary | ICD-10-CM | POA: Diagnosis not present

## 2013-03-13 DIAGNOSIS — Z79899 Other long term (current) drug therapy: Secondary | ICD-10-CM | POA: Diagnosis not present

## 2013-03-13 DIAGNOSIS — Z96649 Presence of unspecified artificial hip joint: Secondary | ICD-10-CM | POA: Diagnosis not present

## 2013-03-13 DIAGNOSIS — F449 Dissociative and conversion disorder, unspecified: Secondary | ICD-10-CM | POA: Diagnosis not present

## 2013-03-13 DIAGNOSIS — M47817 Spondylosis without myelopathy or radiculopathy, lumbosacral region: Secondary | ICD-10-CM | POA: Diagnosis not present

## 2013-03-13 DIAGNOSIS — M461 Sacroiliitis, not elsewhere classified: Secondary | ICD-10-CM | POA: Diagnosis not present

## 2013-03-13 DIAGNOSIS — S83289A Other tear of lateral meniscus, current injury, unspecified knee, initial encounter: Secondary | ICD-10-CM | POA: Diagnosis not present

## 2013-03-13 DIAGNOSIS — E1142 Type 2 diabetes mellitus with diabetic polyneuropathy: Secondary | ICD-10-CM | POA: Diagnosis not present

## 2013-03-13 DIAGNOSIS — IMO0002 Reserved for concepts with insufficient information to code with codable children: Secondary | ICD-10-CM | POA: Diagnosis not present

## 2013-03-13 DIAGNOSIS — M79609 Pain in unspecified limb: Secondary | ICD-10-CM | POA: Diagnosis not present

## 2013-03-13 DIAGNOSIS — E1149 Type 2 diabetes mellitus with other diabetic neurological complication: Secondary | ICD-10-CM | POA: Diagnosis not present

## 2013-03-13 DIAGNOSIS — M25569 Pain in unspecified knee: Secondary | ICD-10-CM | POA: Diagnosis not present

## 2013-04-14 ENCOUNTER — Ambulatory Visit: Payer: Self-pay | Admitting: Pain Medicine

## 2013-04-14 DIAGNOSIS — E119 Type 2 diabetes mellitus without complications: Secondary | ICD-10-CM | POA: Diagnosis not present

## 2013-04-14 DIAGNOSIS — Z79899 Other long term (current) drug therapy: Secondary | ICD-10-CM | POA: Diagnosis not present

## 2013-04-14 DIAGNOSIS — M47817 Spondylosis without myelopathy or radiculopathy, lumbosacral region: Secondary | ICD-10-CM | POA: Diagnosis not present

## 2013-04-14 DIAGNOSIS — IMO0002 Reserved for concepts with insufficient information to code with codable children: Secondary | ICD-10-CM | POA: Diagnosis not present

## 2013-04-14 DIAGNOSIS — M79609 Pain in unspecified limb: Secondary | ICD-10-CM | POA: Diagnosis not present

## 2013-04-14 DIAGNOSIS — G5600 Carpal tunnel syndrome, unspecified upper limb: Secondary | ICD-10-CM | POA: Diagnosis not present

## 2013-04-14 DIAGNOSIS — M461 Sacroiliitis, not elsewhere classified: Secondary | ICD-10-CM | POA: Diagnosis not present

## 2013-04-14 DIAGNOSIS — M5137 Other intervertebral disc degeneration, lumbosacral region: Secondary | ICD-10-CM | POA: Diagnosis not present

## 2013-04-14 DIAGNOSIS — M2240 Chondromalacia patellae, unspecified knee: Secondary | ICD-10-CM | POA: Diagnosis not present

## 2013-04-14 DIAGNOSIS — M62838 Other muscle spasm: Secondary | ICD-10-CM | POA: Diagnosis not present

## 2013-04-14 DIAGNOSIS — M25569 Pain in unspecified knee: Secondary | ICD-10-CM | POA: Diagnosis not present

## 2013-05-13 ENCOUNTER — Ambulatory Visit: Payer: Self-pay | Admitting: Pain Medicine

## 2013-05-13 DIAGNOSIS — M25569 Pain in unspecified knee: Secondary | ICD-10-CM | POA: Diagnosis not present

## 2013-05-13 DIAGNOSIS — G5600 Carpal tunnel syndrome, unspecified upper limb: Secondary | ICD-10-CM | POA: Diagnosis not present

## 2013-05-13 DIAGNOSIS — IMO0002 Reserved for concepts with insufficient information to code with codable children: Secondary | ICD-10-CM | POA: Diagnosis not present

## 2013-05-13 DIAGNOSIS — M47817 Spondylosis without myelopathy or radiculopathy, lumbosacral region: Secondary | ICD-10-CM | POA: Diagnosis not present

## 2013-05-13 DIAGNOSIS — E119 Type 2 diabetes mellitus without complications: Secondary | ICD-10-CM | POA: Diagnosis not present

## 2013-05-13 DIAGNOSIS — M79609 Pain in unspecified limb: Secondary | ICD-10-CM | POA: Diagnosis not present

## 2013-05-13 DIAGNOSIS — Z79899 Other long term (current) drug therapy: Secondary | ICD-10-CM | POA: Diagnosis not present

## 2013-05-13 DIAGNOSIS — M62838 Other muscle spasm: Secondary | ICD-10-CM | POA: Diagnosis not present

## 2013-05-13 DIAGNOSIS — M5137 Other intervertebral disc degeneration, lumbosacral region: Secondary | ICD-10-CM | POA: Diagnosis not present

## 2013-05-13 DIAGNOSIS — Z96649 Presence of unspecified artificial hip joint: Secondary | ICD-10-CM | POA: Diagnosis not present

## 2013-05-15 ENCOUNTER — Emergency Department: Payer: Self-pay | Admitting: Emergency Medicine

## 2013-05-15 DIAGNOSIS — E119 Type 2 diabetes mellitus without complications: Secondary | ICD-10-CM | POA: Diagnosis not present

## 2013-05-15 DIAGNOSIS — L97409 Non-pressure chronic ulcer of unspecified heel and midfoot with unspecified severity: Secondary | ICD-10-CM | POA: Diagnosis not present

## 2013-05-15 DIAGNOSIS — L97509 Non-pressure chronic ulcer of other part of unspecified foot with unspecified severity: Secondary | ICD-10-CM | POA: Diagnosis not present

## 2013-05-15 DIAGNOSIS — M129 Arthropathy, unspecified: Secondary | ICD-10-CM | POA: Diagnosis not present

## 2013-05-15 DIAGNOSIS — E1169 Type 2 diabetes mellitus with other specified complication: Secondary | ICD-10-CM | POA: Diagnosis not present

## 2013-05-15 DIAGNOSIS — L98499 Non-pressure chronic ulcer of skin of other sites with unspecified severity: Secondary | ICD-10-CM | POA: Diagnosis not present

## 2013-05-15 LAB — BASIC METABOLIC PANEL
BUN: 7 mg/dL (ref 7–18)
Calcium, Total: 8.8 mg/dL (ref 8.5–10.1)
Chloride: 100 mmol/L (ref 98–107)
Co2: 33 mmol/L — ABNORMAL HIGH (ref 21–32)
Creatinine: 0.67 mg/dL (ref 0.60–1.30)
EGFR (Non-African Amer.): >60
Glucose: 160 mg/dL — ABNORMAL HIGH (ref 65–99)
Sodium: 136 mmol/L (ref 136–145)

## 2013-05-15 LAB — CBC
HGB: 14.3 g/dL (ref 12.0–16.0)
MCH: 27.7 pg (ref 26.0–34.0)
MCV: 82 fL (ref 80–100)
RBC: 5.18 10*6/uL (ref 3.80–5.20)
RDW: 16.4 % — ABNORMAL HIGH (ref 11.5–14.5)
WBC: 10.5 10*3/uL (ref 3.6–11.0)

## 2013-05-20 LAB — CULTURE, BLOOD (SINGLE)

## 2013-06-12 ENCOUNTER — Ambulatory Visit: Payer: Self-pay | Admitting: Pain Medicine

## 2013-06-12 DIAGNOSIS — Z79899 Other long term (current) drug therapy: Secondary | ICD-10-CM | POA: Diagnosis not present

## 2013-06-12 DIAGNOSIS — IMO0002 Reserved for concepts with insufficient information to code with codable children: Secondary | ICD-10-CM | POA: Diagnosis not present

## 2013-06-12 DIAGNOSIS — M25569 Pain in unspecified knee: Secondary | ICD-10-CM | POA: Diagnosis not present

## 2013-06-12 DIAGNOSIS — M545 Low back pain, unspecified: Secondary | ICD-10-CM | POA: Diagnosis not present

## 2013-06-12 DIAGNOSIS — M47817 Spondylosis without myelopathy or radiculopathy, lumbosacral region: Secondary | ICD-10-CM | POA: Diagnosis not present

## 2013-06-12 DIAGNOSIS — M79609 Pain in unspecified limb: Secondary | ICD-10-CM | POA: Diagnosis not present

## 2013-06-12 DIAGNOSIS — Z96649 Presence of unspecified artificial hip joint: Secondary | ICD-10-CM | POA: Diagnosis not present

## 2013-06-12 DIAGNOSIS — G5600 Carpal tunnel syndrome, unspecified upper limb: Secondary | ICD-10-CM | POA: Diagnosis not present

## 2013-07-08 ENCOUNTER — Ambulatory Visit: Payer: Self-pay | Admitting: Pain Medicine

## 2013-07-08 DIAGNOSIS — M25569 Pain in unspecified knee: Secondary | ICD-10-CM | POA: Diagnosis not present

## 2013-07-08 DIAGNOSIS — M1710 Unilateral primary osteoarthritis, unspecified knee: Secondary | ICD-10-CM | POA: Diagnosis not present

## 2013-07-08 DIAGNOSIS — E1142 Type 2 diabetes mellitus with diabetic polyneuropathy: Secondary | ICD-10-CM | POA: Diagnosis not present

## 2013-07-08 DIAGNOSIS — M47817 Spondylosis without myelopathy or radiculopathy, lumbosacral region: Secondary | ICD-10-CM | POA: Diagnosis not present

## 2013-07-08 DIAGNOSIS — M79609 Pain in unspecified limb: Secondary | ICD-10-CM | POA: Diagnosis not present

## 2013-07-08 DIAGNOSIS — E1149 Type 2 diabetes mellitus with other diabetic neurological complication: Secondary | ICD-10-CM | POA: Diagnosis not present

## 2013-07-08 DIAGNOSIS — IMO0002 Reserved for concepts with insufficient information to code with codable children: Secondary | ICD-10-CM | POA: Diagnosis not present

## 2013-07-08 DIAGNOSIS — Z96649 Presence of unspecified artificial hip joint: Secondary | ICD-10-CM | POA: Diagnosis not present

## 2013-07-10 DIAGNOSIS — L089 Local infection of the skin and subcutaneous tissue, unspecified: Secondary | ICD-10-CM | POA: Diagnosis not present

## 2013-07-10 DIAGNOSIS — E119 Type 2 diabetes mellitus without complications: Secondary | ICD-10-CM | POA: Diagnosis not present

## 2013-07-14 ENCOUNTER — Ambulatory Visit: Payer: Self-pay | Admitting: Pain Medicine

## 2013-07-14 DIAGNOSIS — E1142 Type 2 diabetes mellitus with diabetic polyneuropathy: Secondary | ICD-10-CM | POA: Diagnosis not present

## 2013-07-14 DIAGNOSIS — IMO0002 Reserved for concepts with insufficient information to code with codable children: Secondary | ICD-10-CM | POA: Diagnosis not present

## 2013-07-14 DIAGNOSIS — Z96649 Presence of unspecified artificial hip joint: Secondary | ICD-10-CM | POA: Diagnosis not present

## 2013-07-14 DIAGNOSIS — E1149 Type 2 diabetes mellitus with other diabetic neurological complication: Secondary | ICD-10-CM | POA: Diagnosis not present

## 2013-07-14 DIAGNOSIS — M47817 Spondylosis without myelopathy or radiculopathy, lumbosacral region: Secondary | ICD-10-CM | POA: Diagnosis not present

## 2013-07-21 ENCOUNTER — Encounter: Payer: Self-pay | Admitting: Surgery

## 2013-07-21 ENCOUNTER — Ambulatory Visit: Payer: Self-pay | Admitting: Surgery

## 2013-07-21 DIAGNOSIS — L97909 Non-pressure chronic ulcer of unspecified part of unspecified lower leg with unspecified severity: Secondary | ICD-10-CM | POA: Diagnosis not present

## 2013-07-21 DIAGNOSIS — M545 Low back pain, unspecified: Secondary | ICD-10-CM | POA: Diagnosis not present

## 2013-07-21 DIAGNOSIS — M19079 Primary osteoarthritis, unspecified ankle and foot: Secondary | ICD-10-CM | POA: Diagnosis not present

## 2013-07-21 DIAGNOSIS — E139 Other specified diabetes mellitus without complications: Secondary | ICD-10-CM | POA: Diagnosis not present

## 2013-07-21 DIAGNOSIS — Z87891 Personal history of nicotine dependence: Secondary | ICD-10-CM | POA: Diagnosis not present

## 2013-07-21 DIAGNOSIS — E119 Type 2 diabetes mellitus without complications: Secondary | ICD-10-CM | POA: Diagnosis not present

## 2013-07-27 ENCOUNTER — Encounter: Payer: Self-pay | Admitting: Surgery

## 2013-07-28 ENCOUNTER — Ambulatory Visit: Payer: Self-pay | Admitting: Surgery

## 2013-07-28 ENCOUNTER — Encounter: Payer: Self-pay | Admitting: Surgery

## 2013-07-28 DIAGNOSIS — L97509 Non-pressure chronic ulcer of other part of unspecified foot with unspecified severity: Secondary | ICD-10-CM | POA: Diagnosis not present

## 2013-07-28 DIAGNOSIS — F17200 Nicotine dependence, unspecified, uncomplicated: Secondary | ICD-10-CM | POA: Diagnosis not present

## 2013-07-28 DIAGNOSIS — E139 Other specified diabetes mellitus without complications: Secondary | ICD-10-CM | POA: Diagnosis not present

## 2013-07-28 DIAGNOSIS — E119 Type 2 diabetes mellitus without complications: Secondary | ICD-10-CM | POA: Diagnosis not present

## 2013-07-28 DIAGNOSIS — L97909 Non-pressure chronic ulcer of unspecified part of unspecified lower leg with unspecified severity: Secondary | ICD-10-CM | POA: Diagnosis not present

## 2013-07-30 ENCOUNTER — Ambulatory Visit: Payer: Self-pay | Admitting: Pain Medicine

## 2013-07-30 DIAGNOSIS — M62838 Other muscle spasm: Secondary | ICD-10-CM | POA: Diagnosis not present

## 2013-07-30 DIAGNOSIS — M47814 Spondylosis without myelopathy or radiculopathy, thoracic region: Secondary | ICD-10-CM | POA: Diagnosis not present

## 2013-07-30 DIAGNOSIS — E1149 Type 2 diabetes mellitus with other diabetic neurological complication: Secondary | ICD-10-CM | POA: Diagnosis not present

## 2013-07-30 DIAGNOSIS — L97509 Non-pressure chronic ulcer of other part of unspecified foot with unspecified severity: Secondary | ICD-10-CM | POA: Diagnosis not present

## 2013-07-30 DIAGNOSIS — E1142 Type 2 diabetes mellitus with diabetic polyneuropathy: Secondary | ICD-10-CM | POA: Diagnosis not present

## 2013-07-30 DIAGNOSIS — M79609 Pain in unspecified limb: Secondary | ICD-10-CM | POA: Diagnosis not present

## 2013-07-30 DIAGNOSIS — M545 Low back pain, unspecified: Secondary | ICD-10-CM | POA: Diagnosis not present

## 2013-07-30 DIAGNOSIS — Z79899 Other long term (current) drug therapy: Secondary | ICD-10-CM | POA: Diagnosis not present

## 2013-08-01 ENCOUNTER — Ambulatory Visit: Payer: Self-pay | Admitting: Family Medicine

## 2013-08-01 DIAGNOSIS — R52 Pain, unspecified: Secondary | ICD-10-CM | POA: Diagnosis not present

## 2013-08-01 DIAGNOSIS — R0789 Other chest pain: Secondary | ICD-10-CM | POA: Diagnosis not present

## 2013-08-01 DIAGNOSIS — R079 Chest pain, unspecified: Secondary | ICD-10-CM | POA: Diagnosis not present

## 2013-08-01 DIAGNOSIS — E119 Type 2 diabetes mellitus without complications: Secondary | ICD-10-CM | POA: Diagnosis not present

## 2013-08-04 DIAGNOSIS — E119 Type 2 diabetes mellitus without complications: Secondary | ICD-10-CM | POA: Diagnosis not present

## 2013-08-04 DIAGNOSIS — F17200 Nicotine dependence, unspecified, uncomplicated: Secondary | ICD-10-CM | POA: Diagnosis not present

## 2013-08-04 DIAGNOSIS — E139 Other specified diabetes mellitus without complications: Secondary | ICD-10-CM | POA: Diagnosis not present

## 2013-08-04 DIAGNOSIS — L97909 Non-pressure chronic ulcer of unspecified part of unspecified lower leg with unspecified severity: Secondary | ICD-10-CM | POA: Diagnosis not present

## 2013-08-07 ENCOUNTER — Ambulatory Visit: Payer: Self-pay | Admitting: Pain Medicine

## 2013-08-07 DIAGNOSIS — M5137 Other intervertebral disc degeneration, lumbosacral region: Secondary | ICD-10-CM | POA: Diagnosis not present

## 2013-08-07 DIAGNOSIS — L97509 Non-pressure chronic ulcer of other part of unspecified foot with unspecified severity: Secondary | ICD-10-CM | POA: Diagnosis not present

## 2013-08-07 DIAGNOSIS — M47817 Spondylosis without myelopathy or radiculopathy, lumbosacral region: Secondary | ICD-10-CM | POA: Diagnosis not present

## 2013-08-07 DIAGNOSIS — M7989 Other specified soft tissue disorders: Secondary | ICD-10-CM | POA: Diagnosis not present

## 2013-08-07 DIAGNOSIS — Z79899 Other long term (current) drug therapy: Secondary | ICD-10-CM | POA: Diagnosis not present

## 2013-08-07 DIAGNOSIS — E1142 Type 2 diabetes mellitus with diabetic polyneuropathy: Secondary | ICD-10-CM | POA: Diagnosis not present

## 2013-08-07 DIAGNOSIS — E1149 Type 2 diabetes mellitus with other diabetic neurological complication: Secondary | ICD-10-CM | POA: Diagnosis not present

## 2013-08-07 DIAGNOSIS — E1169 Type 2 diabetes mellitus with other specified complication: Secondary | ICD-10-CM | POA: Diagnosis not present

## 2013-08-07 DIAGNOSIS — E119 Type 2 diabetes mellitus without complications: Secondary | ICD-10-CM | POA: Diagnosis not present

## 2013-08-07 DIAGNOSIS — E785 Hyperlipidemia, unspecified: Secondary | ICD-10-CM | POA: Diagnosis not present

## 2013-08-07 DIAGNOSIS — M25569 Pain in unspecified knee: Secondary | ICD-10-CM | POA: Diagnosis not present

## 2013-08-07 DIAGNOSIS — M79609 Pain in unspecified limb: Secondary | ICD-10-CM | POA: Diagnosis not present

## 2013-08-07 DIAGNOSIS — IMO0002 Reserved for concepts with insufficient information to code with codable children: Secondary | ICD-10-CM | POA: Diagnosis not present

## 2013-08-11 DIAGNOSIS — E1142 Type 2 diabetes mellitus with diabetic polyneuropathy: Secondary | ICD-10-CM | POA: Diagnosis not present

## 2013-08-11 DIAGNOSIS — E1149 Type 2 diabetes mellitus with other diabetic neurological complication: Secondary | ICD-10-CM | POA: Diagnosis not present

## 2013-08-11 DIAGNOSIS — L97909 Non-pressure chronic ulcer of unspecified part of unspecified lower leg with unspecified severity: Secondary | ICD-10-CM | POA: Diagnosis not present

## 2013-08-11 DIAGNOSIS — D2370 Other benign neoplasm of skin of unspecified lower limb, including hip: Secondary | ICD-10-CM | POA: Diagnosis not present

## 2013-08-13 ENCOUNTER — Ambulatory Visit: Payer: Self-pay | Admitting: Pain Medicine

## 2013-08-13 DIAGNOSIS — Z79899 Other long term (current) drug therapy: Secondary | ICD-10-CM | POA: Diagnosis not present

## 2013-08-13 DIAGNOSIS — S91309A Unspecified open wound, unspecified foot, initial encounter: Secondary | ICD-10-CM | POA: Diagnosis not present

## 2013-08-13 DIAGNOSIS — IMO0002 Reserved for concepts with insufficient information to code with codable children: Secondary | ICD-10-CM | POA: Diagnosis not present

## 2013-08-13 DIAGNOSIS — M79609 Pain in unspecified limb: Secondary | ICD-10-CM | POA: Diagnosis not present

## 2013-08-14 DIAGNOSIS — L97909 Non-pressure chronic ulcer of unspecified part of unspecified lower leg with unspecified severity: Secondary | ICD-10-CM | POA: Diagnosis not present

## 2013-08-14 DIAGNOSIS — E119 Type 2 diabetes mellitus without complications: Secondary | ICD-10-CM | POA: Diagnosis not present

## 2013-08-14 DIAGNOSIS — E139 Other specified diabetes mellitus without complications: Secondary | ICD-10-CM | POA: Diagnosis not present

## 2013-08-14 DIAGNOSIS — F17200 Nicotine dependence, unspecified, uncomplicated: Secondary | ICD-10-CM | POA: Diagnosis not present

## 2013-08-20 ENCOUNTER — Ambulatory Visit: Payer: Self-pay | Admitting: Pain Medicine

## 2013-08-20 DIAGNOSIS — Z96649 Presence of unspecified artificial hip joint: Secondary | ICD-10-CM | POA: Diagnosis not present

## 2013-08-20 DIAGNOSIS — M79609 Pain in unspecified limb: Secondary | ICD-10-CM | POA: Diagnosis not present

## 2013-08-20 DIAGNOSIS — L97509 Non-pressure chronic ulcer of other part of unspecified foot with unspecified severity: Secondary | ICD-10-CM | POA: Diagnosis not present

## 2013-08-20 DIAGNOSIS — M47817 Spondylosis without myelopathy or radiculopathy, lumbosacral region: Secondary | ICD-10-CM | POA: Diagnosis not present

## 2013-08-20 DIAGNOSIS — M545 Low back pain, unspecified: Secondary | ICD-10-CM | POA: Diagnosis not present

## 2013-08-25 DIAGNOSIS — B351 Tinea unguium: Secondary | ICD-10-CM | POA: Diagnosis not present

## 2013-08-25 DIAGNOSIS — E1142 Type 2 diabetes mellitus with diabetic polyneuropathy: Secondary | ICD-10-CM | POA: Diagnosis not present

## 2013-08-25 DIAGNOSIS — L97909 Non-pressure chronic ulcer of unspecified part of unspecified lower leg with unspecified severity: Secondary | ICD-10-CM | POA: Diagnosis not present

## 2013-08-25 DIAGNOSIS — E1149 Type 2 diabetes mellitus with other diabetic neurological complication: Secondary | ICD-10-CM | POA: Diagnosis not present

## 2013-08-27 ENCOUNTER — Ambulatory Visit: Payer: Self-pay | Admitting: Pain Medicine

## 2013-08-27 DIAGNOSIS — E1142 Type 2 diabetes mellitus with diabetic polyneuropathy: Secondary | ICD-10-CM | POA: Diagnosis not present

## 2013-08-27 DIAGNOSIS — S81809A Unspecified open wound, unspecified lower leg, initial encounter: Secondary | ICD-10-CM | POA: Diagnosis not present

## 2013-08-27 DIAGNOSIS — M47817 Spondylosis without myelopathy or radiculopathy, lumbosacral region: Secondary | ICD-10-CM | POA: Diagnosis not present

## 2013-08-27 DIAGNOSIS — E1149 Type 2 diabetes mellitus with other diabetic neurological complication: Secondary | ICD-10-CM | POA: Diagnosis not present

## 2013-08-27 DIAGNOSIS — Z79899 Other long term (current) drug therapy: Secondary | ICD-10-CM | POA: Diagnosis not present

## 2013-08-27 DIAGNOSIS — E1169 Type 2 diabetes mellitus with other specified complication: Secondary | ICD-10-CM | POA: Diagnosis not present

## 2013-09-09 ENCOUNTER — Ambulatory Visit: Payer: Self-pay | Admitting: Pain Medicine

## 2013-09-09 DIAGNOSIS — Z79899 Other long term (current) drug therapy: Secondary | ICD-10-CM | POA: Diagnosis not present

## 2013-09-09 DIAGNOSIS — E1049 Type 1 diabetes mellitus with other diabetic neurological complication: Secondary | ICD-10-CM | POA: Diagnosis not present

## 2013-09-09 DIAGNOSIS — Z96649 Presence of unspecified artificial hip joint: Secondary | ICD-10-CM | POA: Diagnosis not present

## 2013-09-09 DIAGNOSIS — IMO0002 Reserved for concepts with insufficient information to code with codable children: Secondary | ICD-10-CM | POA: Diagnosis not present

## 2013-09-09 DIAGNOSIS — M5137 Other intervertebral disc degeneration, lumbosacral region: Secondary | ICD-10-CM | POA: Diagnosis not present

## 2013-09-09 DIAGNOSIS — M47817 Spondylosis without myelopathy or radiculopathy, lumbosacral region: Secondary | ICD-10-CM | POA: Diagnosis not present

## 2013-09-09 DIAGNOSIS — M62838 Other muscle spasm: Secondary | ICD-10-CM | POA: Diagnosis not present

## 2013-09-17 ENCOUNTER — Ambulatory Visit: Payer: Self-pay | Admitting: Pain Medicine

## 2013-09-17 DIAGNOSIS — Z96649 Presence of unspecified artificial hip joint: Secondary | ICD-10-CM | POA: Diagnosis not present

## 2013-09-17 DIAGNOSIS — M62838 Other muscle spasm: Secondary | ICD-10-CM | POA: Diagnosis not present

## 2013-09-17 DIAGNOSIS — E1149 Type 2 diabetes mellitus with other diabetic neurological complication: Secondary | ICD-10-CM | POA: Diagnosis not present

## 2013-09-17 DIAGNOSIS — IMO0002 Reserved for concepts with insufficient information to code with codable children: Secondary | ICD-10-CM | POA: Diagnosis not present

## 2013-09-17 DIAGNOSIS — M5137 Other intervertebral disc degeneration, lumbosacral region: Secondary | ICD-10-CM | POA: Diagnosis not present

## 2013-09-17 DIAGNOSIS — Z79899 Other long term (current) drug therapy: Secondary | ICD-10-CM | POA: Diagnosis not present

## 2013-09-17 DIAGNOSIS — E1142 Type 2 diabetes mellitus with diabetic polyneuropathy: Secondary | ICD-10-CM | POA: Diagnosis not present

## 2013-10-09 ENCOUNTER — Ambulatory Visit: Payer: Self-pay | Admitting: Pain Medicine

## 2013-10-09 DIAGNOSIS — M461 Sacroiliitis, not elsewhere classified: Secondary | ICD-10-CM | POA: Diagnosis not present

## 2013-10-09 DIAGNOSIS — E1149 Type 2 diabetes mellitus with other diabetic neurological complication: Secondary | ICD-10-CM | POA: Diagnosis not present

## 2013-10-09 DIAGNOSIS — M47817 Spondylosis without myelopathy or radiculopathy, lumbosacral region: Secondary | ICD-10-CM | POA: Diagnosis not present

## 2013-10-09 DIAGNOSIS — M5137 Other intervertebral disc degeneration, lumbosacral region: Secondary | ICD-10-CM | POA: Diagnosis not present

## 2013-10-09 DIAGNOSIS — Z96649 Presence of unspecified artificial hip joint: Secondary | ICD-10-CM | POA: Diagnosis not present

## 2013-10-09 DIAGNOSIS — M169 Osteoarthritis of hip, unspecified: Secondary | ICD-10-CM | POA: Diagnosis not present

## 2013-10-09 DIAGNOSIS — E1142 Type 2 diabetes mellitus with diabetic polyneuropathy: Secondary | ICD-10-CM | POA: Diagnosis not present

## 2013-10-09 DIAGNOSIS — E1049 Type 1 diabetes mellitus with other diabetic neurological complication: Secondary | ICD-10-CM | POA: Diagnosis not present

## 2013-10-09 DIAGNOSIS — IMO0002 Reserved for concepts with insufficient information to code with codable children: Secondary | ICD-10-CM | POA: Diagnosis not present

## 2013-10-09 DIAGNOSIS — Z79899 Other long term (current) drug therapy: Secondary | ICD-10-CM | POA: Diagnosis not present

## 2013-10-09 DIAGNOSIS — M1610 Unilateral primary osteoarthritis, unspecified hip: Secondary | ICD-10-CM | POA: Diagnosis not present

## 2013-11-05 ENCOUNTER — Ambulatory Visit: Payer: Self-pay | Admitting: Pain Medicine

## 2013-11-05 DIAGNOSIS — Z79899 Other long term (current) drug therapy: Secondary | ICD-10-CM | POA: Diagnosis not present

## 2013-11-05 DIAGNOSIS — M47817 Spondylosis without myelopathy or radiculopathy, lumbosacral region: Secondary | ICD-10-CM | POA: Diagnosis not present

## 2013-11-05 DIAGNOSIS — E119 Type 2 diabetes mellitus without complications: Secondary | ICD-10-CM | POA: Diagnosis not present

## 2013-11-05 DIAGNOSIS — M5137 Other intervertebral disc degeneration, lumbosacral region: Secondary | ICD-10-CM | POA: Diagnosis not present

## 2013-11-05 DIAGNOSIS — G5600 Carpal tunnel syndrome, unspecified upper limb: Secondary | ICD-10-CM | POA: Diagnosis not present

## 2013-11-05 DIAGNOSIS — E1049 Type 1 diabetes mellitus with other diabetic neurological complication: Secondary | ICD-10-CM | POA: Diagnosis not present

## 2013-11-05 DIAGNOSIS — IMO0002 Reserved for concepts with insufficient information to code with codable children: Secondary | ICD-10-CM | POA: Diagnosis not present

## 2013-11-17 ENCOUNTER — Emergency Department: Payer: Self-pay | Admitting: Emergency Medicine

## 2013-11-17 DIAGNOSIS — R3129 Other microscopic hematuria: Secondary | ICD-10-CM | POA: Diagnosis not present

## 2013-11-17 DIAGNOSIS — R112 Nausea with vomiting, unspecified: Secondary | ICD-10-CM | POA: Diagnosis not present

## 2013-11-17 DIAGNOSIS — R109 Unspecified abdominal pain: Secondary | ICD-10-CM | POA: Diagnosis not present

## 2013-11-17 DIAGNOSIS — F17200 Nicotine dependence, unspecified, uncomplicated: Secondary | ICD-10-CM | POA: Diagnosis not present

## 2013-11-17 DIAGNOSIS — E119 Type 2 diabetes mellitus without complications: Secondary | ICD-10-CM | POA: Diagnosis not present

## 2013-11-17 LAB — CBC
HCT: 44.5 % (ref 35.0–47.0)
HGB: 15 g/dL (ref 12.0–16.0)
MCH: 28.6 pg (ref 26.0–34.0)
MCHC: 33.8 g/dL (ref 32.0–36.0)
MCV: 85 fL (ref 80–100)
Platelet: 163 10*3/uL (ref 150–440)
RBC: 5.26 10*6/uL — ABNORMAL HIGH (ref 3.80–5.20)
RDW: 15.7 % — AB (ref 11.5–14.5)
WBC: 13.8 10*3/uL — ABNORMAL HIGH (ref 3.6–11.0)

## 2013-11-17 LAB — COMPREHENSIVE METABOLIC PANEL
ALT: 26 U/L (ref 12–78)
ANION GAP: 4 — AB (ref 7–16)
Albumin: 3.6 g/dL (ref 3.4–5.0)
Alkaline Phosphatase: 82 U/L
BUN: 11 mg/dL (ref 7–18)
Bilirubin,Total: 0.5 mg/dL (ref 0.2–1.0)
CALCIUM: 8.5 mg/dL (ref 8.5–10.1)
CO2: 32 mmol/L (ref 21–32)
Chloride: 97 mmol/L — ABNORMAL LOW (ref 98–107)
Creatinine: 0.75 mg/dL (ref 0.60–1.30)
EGFR (African American): >60
EGFR (Non-African Amer.): >60
GLUCOSE: 122 mg/dL — AB (ref 65–99)
OSMOLALITY: 267 (ref 275–301)
POTASSIUM: 4.4 mmol/L (ref 3.5–5.1)
SGOT(AST): 17 U/L (ref 15–37)
Sodium: 133 mmol/L — ABNORMAL LOW (ref 136–145)
TOTAL PROTEIN: 8.1 g/dL (ref 6.4–8.2)

## 2013-11-17 LAB — URINALYSIS, COMPLETE
BACTERIA: NONE SEEN
Bilirubin,UR: NEGATIVE
GLUCOSE, UR: NEGATIVE mg/dL (ref 0–75)
Ketone: NEGATIVE
Nitrite: NEGATIVE
PH: 6 (ref 4.5–8.0)
SPECIFIC GRAVITY: 1.006 (ref 1.003–1.030)
WBC UR: 2 /HPF (ref 0–5)

## 2013-12-04 ENCOUNTER — Ambulatory Visit: Payer: Self-pay | Admitting: Pain Medicine

## 2013-12-04 DIAGNOSIS — M5137 Other intervertebral disc degeneration, lumbosacral region: Secondary | ICD-10-CM | POA: Diagnosis not present

## 2013-12-04 DIAGNOSIS — E1149 Type 2 diabetes mellitus with other diabetic neurological complication: Secondary | ICD-10-CM | POA: Diagnosis not present

## 2013-12-04 DIAGNOSIS — M47817 Spondylosis without myelopathy or radiculopathy, lumbosacral region: Secondary | ICD-10-CM | POA: Diagnosis not present

## 2013-12-04 DIAGNOSIS — Z79899 Other long term (current) drug therapy: Secondary | ICD-10-CM | POA: Diagnosis not present

## 2013-12-04 DIAGNOSIS — E1142 Type 2 diabetes mellitus with diabetic polyneuropathy: Secondary | ICD-10-CM | POA: Diagnosis not present

## 2013-12-04 DIAGNOSIS — E1049 Type 1 diabetes mellitus with other diabetic neurological complication: Secondary | ICD-10-CM | POA: Diagnosis not present

## 2013-12-04 DIAGNOSIS — IMO0002 Reserved for concepts with insufficient information to code with codable children: Secondary | ICD-10-CM | POA: Diagnosis not present

## 2013-12-15 DIAGNOSIS — I517 Cardiomegaly: Secondary | ICD-10-CM | POA: Diagnosis not present

## 2013-12-15 DIAGNOSIS — Z794 Long term (current) use of insulin: Secondary | ICD-10-CM | POA: Diagnosis not present

## 2013-12-15 DIAGNOSIS — R509 Fever, unspecified: Secondary | ICD-10-CM | POA: Diagnosis not present

## 2013-12-15 DIAGNOSIS — A419 Sepsis, unspecified organism: Secondary | ICD-10-CM | POA: Diagnosis not present

## 2013-12-15 DIAGNOSIS — R6510 Systemic inflammatory response syndrome (SIRS) of non-infectious origin without acute organ dysfunction: Secondary | ICD-10-CM | POA: Diagnosis not present

## 2013-12-15 DIAGNOSIS — M79609 Pain in unspecified limb: Secondary | ICD-10-CM | POA: Diagnosis not present

## 2013-12-15 DIAGNOSIS — Z886 Allergy status to analgesic agent status: Secondary | ICD-10-CM | POA: Diagnosis not present

## 2013-12-15 DIAGNOSIS — J811 Chronic pulmonary edema: Secondary | ICD-10-CM | POA: Diagnosis not present

## 2013-12-15 DIAGNOSIS — J690 Pneumonitis due to inhalation of food and vomit: Secondary | ICD-10-CM | POA: Diagnosis not present

## 2013-12-15 DIAGNOSIS — J810 Acute pulmonary edema: Secondary | ICD-10-CM | POA: Diagnosis not present

## 2013-12-15 DIAGNOSIS — F17200 Nicotine dependence, unspecified, uncomplicated: Secondary | ICD-10-CM | POA: Diagnosis present

## 2013-12-15 DIAGNOSIS — M129 Arthropathy, unspecified: Secondary | ICD-10-CM | POA: Diagnosis present

## 2013-12-15 DIAGNOSIS — Z79899 Other long term (current) drug therapy: Secondary | ICD-10-CM | POA: Diagnosis not present

## 2013-12-15 DIAGNOSIS — G929 Unspecified toxic encephalopathy: Secondary | ICD-10-CM | POA: Diagnosis present

## 2013-12-15 DIAGNOSIS — R0902 Hypoxemia: Secondary | ICD-10-CM | POA: Diagnosis not present

## 2013-12-15 DIAGNOSIS — I519 Heart disease, unspecified: Secondary | ICD-10-CM | POA: Diagnosis not present

## 2013-12-15 DIAGNOSIS — L98499 Non-pressure chronic ulcer of skin of other sites with unspecified severity: Secondary | ICD-10-CM | POA: Diagnosis not present

## 2013-12-15 DIAGNOSIS — I798 Other disorders of arteries, arterioles and capillaries in diseases classified elsewhere: Secondary | ICD-10-CM | POA: Diagnosis present

## 2013-12-15 DIAGNOSIS — E119 Type 2 diabetes mellitus without complications: Secondary | ICD-10-CM | POA: Diagnosis not present

## 2013-12-15 DIAGNOSIS — K5900 Constipation, unspecified: Secondary | ICD-10-CM | POA: Diagnosis not present

## 2013-12-15 DIAGNOSIS — J9620 Acute and chronic respiratory failure, unspecified whether with hypoxia or hypercapnia: Secondary | ICD-10-CM | POA: Diagnosis not present

## 2013-12-15 DIAGNOSIS — J9600 Acute respiratory failure, unspecified whether with hypoxia or hypercapnia: Secondary | ICD-10-CM | POA: Diagnosis not present

## 2013-12-15 DIAGNOSIS — R0989 Other specified symptoms and signs involving the circulatory and respiratory systems: Secondary | ICD-10-CM | POA: Diagnosis not present

## 2013-12-15 DIAGNOSIS — J449 Chronic obstructive pulmonary disease, unspecified: Secondary | ICD-10-CM | POA: Diagnosis present

## 2013-12-15 DIAGNOSIS — E1159 Type 2 diabetes mellitus with other circulatory complications: Secondary | ICD-10-CM | POA: Diagnosis present

## 2013-12-15 DIAGNOSIS — R4182 Altered mental status, unspecified: Secondary | ICD-10-CM | POA: Diagnosis not present

## 2013-12-15 DIAGNOSIS — I4510 Unspecified right bundle-branch block: Secondary | ICD-10-CM | POA: Diagnosis not present

## 2013-12-15 DIAGNOSIS — R0609 Other forms of dyspnea: Secondary | ICD-10-CM | POA: Diagnosis not present

## 2013-12-15 DIAGNOSIS — G92 Toxic encephalopathy: Secondary | ICD-10-CM | POA: Diagnosis present

## 2013-12-15 DIAGNOSIS — G4733 Obstructive sleep apnea (adult) (pediatric): Secondary | ICD-10-CM | POA: Diagnosis present

## 2013-12-15 DIAGNOSIS — R918 Other nonspecific abnormal finding of lung field: Secondary | ICD-10-CM | POA: Diagnosis not present

## 2013-12-15 DIAGNOSIS — I498 Other specified cardiac arrhythmias: Secondary | ICD-10-CM | POA: Diagnosis not present

## 2013-12-16 DIAGNOSIS — R6510 Systemic inflammatory response syndrome (SIRS) of non-infectious origin without acute organ dysfunction: Secondary | ICD-10-CM | POA: Insufficient documentation

## 2013-12-16 DIAGNOSIS — M1990 Unspecified osteoarthritis, unspecified site: Secondary | ICD-10-CM | POA: Insufficient documentation

## 2013-12-16 DIAGNOSIS — E119 Type 2 diabetes mellitus without complications: Secondary | ICD-10-CM | POA: Insufficient documentation

## 2013-12-19 DIAGNOSIS — Z720 Tobacco use: Secondary | ICD-10-CM | POA: Insufficient documentation

## 2013-12-24 DIAGNOSIS — E119 Type 2 diabetes mellitus without complications: Secondary | ICD-10-CM | POA: Diagnosis not present

## 2013-12-24 DIAGNOSIS — R52 Pain, unspecified: Secondary | ICD-10-CM | POA: Diagnosis not present

## 2013-12-24 DIAGNOSIS — L089 Local infection of the skin and subcutaneous tissue, unspecified: Secondary | ICD-10-CM | POA: Diagnosis not present

## 2013-12-31 ENCOUNTER — Ambulatory Visit: Payer: Self-pay | Admitting: Pain Medicine

## 2013-12-31 DIAGNOSIS — Z96649 Presence of unspecified artificial hip joint: Secondary | ICD-10-CM | POA: Diagnosis not present

## 2013-12-31 DIAGNOSIS — E119 Type 2 diabetes mellitus without complications: Secondary | ICD-10-CM | POA: Diagnosis not present

## 2013-12-31 DIAGNOSIS — M5137 Other intervertebral disc degeneration, lumbosacral region: Secondary | ICD-10-CM | POA: Diagnosis not present

## 2013-12-31 DIAGNOSIS — IMO0002 Reserved for concepts with insufficient information to code with codable children: Secondary | ICD-10-CM | POA: Diagnosis not present

## 2013-12-31 DIAGNOSIS — E1049 Type 1 diabetes mellitus with other diabetic neurological complication: Secondary | ICD-10-CM | POA: Diagnosis not present

## 2013-12-31 DIAGNOSIS — M47817 Spondylosis without myelopathy or radiculopathy, lumbosacral region: Secondary | ICD-10-CM | POA: Diagnosis not present

## 2013-12-31 DIAGNOSIS — Z79899 Other long term (current) drug therapy: Secondary | ICD-10-CM | POA: Diagnosis not present

## 2014-01-05 ENCOUNTER — Encounter: Payer: Self-pay | Admitting: Surgery

## 2014-01-05 DIAGNOSIS — E1349 Other specified diabetes mellitus with other diabetic neurological complication: Secondary | ICD-10-CM | POA: Diagnosis not present

## 2014-01-05 DIAGNOSIS — F17200 Nicotine dependence, unspecified, uncomplicated: Secondary | ICD-10-CM | POA: Diagnosis not present

## 2014-01-05 DIAGNOSIS — S91309A Unspecified open wound, unspecified foot, initial encounter: Secondary | ICD-10-CM | POA: Diagnosis not present

## 2014-01-20 DIAGNOSIS — E1349 Other specified diabetes mellitus with other diabetic neurological complication: Secondary | ICD-10-CM | POA: Diagnosis not present

## 2014-01-20 DIAGNOSIS — F17200 Nicotine dependence, unspecified, uncomplicated: Secondary | ICD-10-CM | POA: Diagnosis not present

## 2014-01-20 DIAGNOSIS — S91309A Unspecified open wound, unspecified foot, initial encounter: Secondary | ICD-10-CM | POA: Diagnosis not present

## 2014-01-27 ENCOUNTER — Encounter: Payer: Self-pay | Admitting: Surgery

## 2014-01-28 DIAGNOSIS — E119 Type 2 diabetes mellitus without complications: Secondary | ICD-10-CM | POA: Diagnosis not present

## 2014-01-28 DIAGNOSIS — F17200 Nicotine dependence, unspecified, uncomplicated: Secondary | ICD-10-CM | POA: Diagnosis not present

## 2014-01-28 DIAGNOSIS — E1169 Type 2 diabetes mellitus with other specified complication: Secondary | ICD-10-CM | POA: Diagnosis not present

## 2014-01-28 DIAGNOSIS — J189 Pneumonia, unspecified organism: Secondary | ICD-10-CM | POA: Diagnosis not present

## 2014-01-28 LAB — HEMOGLOBIN A1C: HEMOGLOBIN A1C: 6.7 % — AB (ref 4.0–6.0)

## 2014-02-05 ENCOUNTER — Ambulatory Visit: Payer: Self-pay | Admitting: Pain Medicine

## 2014-02-05 DIAGNOSIS — M47817 Spondylosis without myelopathy or radiculopathy, lumbosacral region: Secondary | ICD-10-CM | POA: Diagnosis not present

## 2014-02-05 DIAGNOSIS — IMO0002 Reserved for concepts with insufficient information to code with codable children: Secondary | ICD-10-CM | POA: Diagnosis not present

## 2014-02-05 DIAGNOSIS — E1049 Type 1 diabetes mellitus with other diabetic neurological complication: Secondary | ICD-10-CM | POA: Diagnosis not present

## 2014-02-06 DIAGNOSIS — M19079 Primary osteoarthritis, unspecified ankle and foot: Secondary | ICD-10-CM | POA: Diagnosis not present

## 2014-02-06 DIAGNOSIS — L97509 Non-pressure chronic ulcer of other part of unspecified foot with unspecified severity: Secondary | ICD-10-CM | POA: Diagnosis not present

## 2014-02-10 ENCOUNTER — Encounter: Payer: Self-pay | Admitting: Surgery

## 2014-02-10 DIAGNOSIS — S91309A Unspecified open wound, unspecified foot, initial encounter: Secondary | ICD-10-CM | POA: Diagnosis not present

## 2014-02-10 DIAGNOSIS — E1349 Other specified diabetes mellitus with other diabetic neurological complication: Secondary | ICD-10-CM | POA: Diagnosis not present

## 2014-02-25 ENCOUNTER — Ambulatory Visit: Payer: Self-pay | Admitting: Pain Medicine

## 2014-02-25 DIAGNOSIS — M86179 Other acute osteomyelitis, unspecified ankle and foot: Secondary | ICD-10-CM | POA: Diagnosis not present

## 2014-02-25 DIAGNOSIS — M47817 Spondylosis without myelopathy or radiculopathy, lumbosacral region: Secondary | ICD-10-CM | POA: Diagnosis not present

## 2014-02-25 DIAGNOSIS — G589 Mononeuropathy, unspecified: Secondary | ICD-10-CM | POA: Diagnosis not present

## 2014-02-25 DIAGNOSIS — M545 Low back pain, unspecified: Secondary | ICD-10-CM | POA: Diagnosis not present

## 2014-02-25 DIAGNOSIS — E119 Type 2 diabetes mellitus without complications: Secondary | ICD-10-CM | POA: Diagnosis not present

## 2014-02-25 DIAGNOSIS — M86679 Other chronic osteomyelitis, unspecified ankle and foot: Secondary | ICD-10-CM | POA: Diagnosis not present

## 2014-02-25 DIAGNOSIS — L089 Local infection of the skin and subcutaneous tissue, unspecified: Secondary | ICD-10-CM | POA: Diagnosis not present

## 2014-02-25 DIAGNOSIS — E1169 Type 2 diabetes mellitus with other specified complication: Secondary | ICD-10-CM | POA: Diagnosis not present

## 2014-02-25 DIAGNOSIS — M79609 Pain in unspecified limb: Secondary | ICD-10-CM | POA: Diagnosis not present

## 2014-03-05 ENCOUNTER — Ambulatory Visit: Payer: Self-pay | Admitting: Pain Medicine

## 2014-03-05 DIAGNOSIS — M179 Osteoarthritis of knee, unspecified: Secondary | ICD-10-CM | POA: Diagnosis not present

## 2014-03-05 DIAGNOSIS — M545 Low back pain: Secondary | ICD-10-CM | POA: Diagnosis not present

## 2014-03-05 DIAGNOSIS — M169 Osteoarthritis of hip, unspecified: Secondary | ICD-10-CM | POA: Diagnosis not present

## 2014-03-05 DIAGNOSIS — M47817 Spondylosis without myelopathy or radiculopathy, lumbosacral region: Secondary | ICD-10-CM | POA: Diagnosis not present

## 2014-03-05 DIAGNOSIS — E1340 Other specified diabetes mellitus with diabetic neuropathy, unspecified: Secondary | ICD-10-CM | POA: Diagnosis not present

## 2014-03-11 DIAGNOSIS — M869 Osteomyelitis, unspecified: Secondary | ICD-10-CM | POA: Insufficient documentation

## 2014-03-16 ENCOUNTER — Ambulatory Visit: Payer: Self-pay | Admitting: Pain Medicine

## 2014-03-16 DIAGNOSIS — M545 Low back pain: Secondary | ICD-10-CM | POA: Diagnosis not present

## 2014-03-16 DIAGNOSIS — M79604 Pain in right leg: Secondary | ICD-10-CM | POA: Diagnosis not present

## 2014-03-16 DIAGNOSIS — M79605 Pain in left leg: Secondary | ICD-10-CM | POA: Diagnosis not present

## 2014-04-07 ENCOUNTER — Ambulatory Visit: Payer: Self-pay | Admitting: Pain Medicine

## 2014-04-07 DIAGNOSIS — M545 Low back pain: Secondary | ICD-10-CM | POA: Diagnosis not present

## 2014-04-07 DIAGNOSIS — M533 Sacrococcygeal disorders, not elsewhere classified: Secondary | ICD-10-CM | POA: Diagnosis not present

## 2014-04-07 DIAGNOSIS — M5137 Other intervertebral disc degeneration, lumbosacral region: Secondary | ICD-10-CM | POA: Diagnosis not present

## 2014-04-07 DIAGNOSIS — Z96649 Presence of unspecified artificial hip joint: Secondary | ICD-10-CM | POA: Diagnosis not present

## 2014-04-07 DIAGNOSIS — M47817 Spondylosis without myelopathy or radiculopathy, lumbosacral region: Secondary | ICD-10-CM | POA: Diagnosis not present

## 2014-04-07 DIAGNOSIS — E1340 Other specified diabetes mellitus with diabetic neuropathy, unspecified: Secondary | ICD-10-CM | POA: Diagnosis not present

## 2014-04-07 DIAGNOSIS — M25552 Pain in left hip: Secondary | ICD-10-CM | POA: Diagnosis not present

## 2014-04-07 DIAGNOSIS — M169 Osteoarthritis of hip, unspecified: Secondary | ICD-10-CM | POA: Diagnosis not present

## 2014-04-07 DIAGNOSIS — M25551 Pain in right hip: Secondary | ICD-10-CM | POA: Diagnosis not present

## 2014-04-07 DIAGNOSIS — M461 Sacroiliitis, not elsewhere classified: Secondary | ICD-10-CM | POA: Diagnosis not present

## 2014-04-25 ENCOUNTER — Emergency Department: Payer: Self-pay | Admitting: Emergency Medicine

## 2014-04-25 DIAGNOSIS — R042 Hemoptysis: Secondary | ICD-10-CM | POA: Diagnosis not present

## 2014-04-25 DIAGNOSIS — J209 Acute bronchitis, unspecified: Secondary | ICD-10-CM | POA: Diagnosis not present

## 2014-04-25 DIAGNOSIS — Z720 Tobacco use: Secondary | ICD-10-CM | POA: Diagnosis not present

## 2014-04-25 DIAGNOSIS — E119 Type 2 diabetes mellitus without complications: Secondary | ICD-10-CM | POA: Diagnosis not present

## 2014-04-25 DIAGNOSIS — R05 Cough: Secondary | ICD-10-CM | POA: Diagnosis not present

## 2014-04-25 DIAGNOSIS — R9431 Abnormal electrocardiogram [ECG] [EKG]: Secondary | ICD-10-CM | POA: Diagnosis not present

## 2014-04-25 LAB — CBC
HCT: 45.1 % (ref 35.0–47.0)
HGB: 14.8 g/dL (ref 12.0–16.0)
MCH: 28.1 pg (ref 26.0–34.0)
MCHC: 32.8 g/dL (ref 32.0–36.0)
MCV: 86 fL (ref 80–100)
PLATELETS: 135 10*3/uL — AB (ref 150–440)
RBC: 5.27 10*6/uL — AB (ref 3.80–5.20)
RDW: 15.8 % — ABNORMAL HIGH (ref 11.5–14.5)
WBC: 9.3 10*3/uL (ref 3.6–11.0)

## 2014-04-25 LAB — BASIC METABOLIC PANEL
ANION GAP: 5 — AB (ref 7–16)
BUN: 12 mg/dL (ref 7–18)
Calcium, Total: 8 mg/dL — ABNORMAL LOW (ref 8.5–10.1)
Chloride: 101 mmol/L (ref 98–107)
Co2: 30 mmol/L (ref 21–32)
Creatinine: 0.79 mg/dL (ref 0.60–1.30)
EGFR (Non-African Amer.): >60
Glucose: 216 mg/dL — ABNORMAL HIGH (ref 65–99)
Osmolality: 278 (ref 275–301)
POTASSIUM: 4.4 mmol/L (ref 3.5–5.1)
Sodium: 136 mmol/L (ref 136–145)

## 2014-04-25 LAB — TROPONIN I: TROPONIN-I: 0.11 ng/mL — AB

## 2014-05-07 ENCOUNTER — Ambulatory Visit: Payer: Self-pay | Admitting: Pain Medicine

## 2014-05-07 DIAGNOSIS — E1340 Other specified diabetes mellitus with diabetic neuropathy, unspecified: Secondary | ICD-10-CM | POA: Diagnosis not present

## 2014-05-07 DIAGNOSIS — M47817 Spondylosis without myelopathy or radiculopathy, lumbosacral region: Secondary | ICD-10-CM | POA: Diagnosis not present

## 2014-05-07 DIAGNOSIS — M169 Osteoarthritis of hip, unspecified: Secondary | ICD-10-CM | POA: Diagnosis not present

## 2014-05-07 DIAGNOSIS — M47896 Other spondylosis, lumbar region: Secondary | ICD-10-CM | POA: Diagnosis not present

## 2014-05-07 DIAGNOSIS — M533 Sacrococcygeal disorders, not elsewhere classified: Secondary | ICD-10-CM | POA: Diagnosis not present

## 2014-06-03 ENCOUNTER — Ambulatory Visit: Payer: Self-pay | Admitting: Pain Medicine

## 2014-06-03 DIAGNOSIS — E11621 Type 2 diabetes mellitus with foot ulcer: Secondary | ICD-10-CM | POA: Diagnosis not present

## 2014-06-03 DIAGNOSIS — E1340 Other specified diabetes mellitus with diabetic neuropathy, unspecified: Secondary | ICD-10-CM | POA: Diagnosis not present

## 2014-06-03 DIAGNOSIS — M533 Sacrococcygeal disorders, not elsewhere classified: Secondary | ICD-10-CM | POA: Diagnosis not present

## 2014-06-03 DIAGNOSIS — M47896 Other spondylosis, lumbar region: Secondary | ICD-10-CM | POA: Diagnosis not present

## 2014-06-03 DIAGNOSIS — Z96649 Presence of unspecified artificial hip joint: Secondary | ICD-10-CM | POA: Diagnosis not present

## 2014-06-03 DIAGNOSIS — L97519 Non-pressure chronic ulcer of other part of right foot with unspecified severity: Secondary | ICD-10-CM | POA: Diagnosis not present

## 2014-06-03 DIAGNOSIS — M169 Osteoarthritis of hip, unspecified: Secondary | ICD-10-CM | POA: Diagnosis not present

## 2014-06-03 DIAGNOSIS — M79604 Pain in right leg: Secondary | ICD-10-CM | POA: Diagnosis not present

## 2014-06-03 DIAGNOSIS — M47817 Spondylosis without myelopathy or radiculopathy, lumbosacral region: Secondary | ICD-10-CM | POA: Diagnosis not present

## 2014-06-04 ENCOUNTER — Encounter: Payer: Self-pay | Admitting: Surgery

## 2014-06-04 DIAGNOSIS — S91301A Unspecified open wound, right foot, initial encounter: Secondary | ICD-10-CM | POA: Diagnosis not present

## 2014-06-04 DIAGNOSIS — E11621 Type 2 diabetes mellitus with foot ulcer: Secondary | ICD-10-CM | POA: Diagnosis not present

## 2014-06-10 ENCOUNTER — Ambulatory Visit: Payer: Self-pay | Admitting: Pain Medicine

## 2014-06-10 DIAGNOSIS — E11621 Type 2 diabetes mellitus with foot ulcer: Secondary | ICD-10-CM | POA: Diagnosis not present

## 2014-06-10 DIAGNOSIS — M47817 Spondylosis without myelopathy or radiculopathy, lumbosacral region: Secondary | ICD-10-CM | POA: Diagnosis not present

## 2014-06-10 DIAGNOSIS — E1340 Other specified diabetes mellitus with diabetic neuropathy, unspecified: Secondary | ICD-10-CM | POA: Diagnosis not present

## 2014-06-10 DIAGNOSIS — L97519 Non-pressure chronic ulcer of other part of right foot with unspecified severity: Secondary | ICD-10-CM | POA: Diagnosis not present

## 2014-06-11 ENCOUNTER — Encounter: Payer: Self-pay | Admitting: Surgery

## 2014-06-11 DIAGNOSIS — L97511 Non-pressure chronic ulcer of other part of right foot limited to breakdown of skin: Secondary | ICD-10-CM | POA: Diagnosis not present

## 2014-06-11 DIAGNOSIS — S91301A Unspecified open wound, right foot, initial encounter: Secondary | ICD-10-CM | POA: Diagnosis not present

## 2014-06-11 DIAGNOSIS — E11621 Type 2 diabetes mellitus with foot ulcer: Secondary | ICD-10-CM | POA: Diagnosis not present

## 2014-06-29 ENCOUNTER — Encounter: Payer: Self-pay | Admitting: Surgery

## 2014-06-29 DIAGNOSIS — F17219 Nicotine dependence, cigarettes, with unspecified nicotine-induced disorders: Secondary | ICD-10-CM | POA: Diagnosis not present

## 2014-06-29 DIAGNOSIS — S91301A Unspecified open wound, right foot, initial encounter: Secondary | ICD-10-CM | POA: Diagnosis not present

## 2014-06-29 DIAGNOSIS — E669 Obesity, unspecified: Secondary | ICD-10-CM | POA: Diagnosis not present

## 2014-06-29 DIAGNOSIS — E1140 Type 2 diabetes mellitus with diabetic neuropathy, unspecified: Secondary | ICD-10-CM | POA: Diagnosis not present

## 2014-06-29 DIAGNOSIS — E11621 Type 2 diabetes mellitus with foot ulcer: Secondary | ICD-10-CM | POA: Diagnosis not present

## 2014-07-02 ENCOUNTER — Ambulatory Visit: Payer: Self-pay | Admitting: Pain Medicine

## 2014-07-02 DIAGNOSIS — E119 Type 2 diabetes mellitus without complications: Secondary | ICD-10-CM | POA: Diagnosis not present

## 2014-07-02 DIAGNOSIS — M47896 Other spondylosis, lumbar region: Secondary | ICD-10-CM | POA: Diagnosis not present

## 2014-07-02 DIAGNOSIS — L97519 Non-pressure chronic ulcer of other part of right foot with unspecified severity: Secondary | ICD-10-CM | POA: Diagnosis not present

## 2014-07-08 ENCOUNTER — Ambulatory Visit: Payer: Self-pay | Admitting: Pain Medicine

## 2014-07-08 DIAGNOSIS — M545 Low back pain: Secondary | ICD-10-CM | POA: Diagnosis not present

## 2014-07-08 DIAGNOSIS — F17200 Nicotine dependence, unspecified, uncomplicated: Secondary | ICD-10-CM | POA: Diagnosis not present

## 2014-07-08 DIAGNOSIS — Z96641 Presence of right artificial hip joint: Secondary | ICD-10-CM | POA: Diagnosis not present

## 2014-07-08 DIAGNOSIS — E119 Type 2 diabetes mellitus without complications: Secondary | ICD-10-CM | POA: Diagnosis not present

## 2014-07-10 DIAGNOSIS — F17219 Nicotine dependence, cigarettes, with unspecified nicotine-induced disorders: Secondary | ICD-10-CM | POA: Diagnosis not present

## 2014-07-10 DIAGNOSIS — E11621 Type 2 diabetes mellitus with foot ulcer: Secondary | ICD-10-CM | POA: Diagnosis not present

## 2014-07-10 DIAGNOSIS — M86571 Other chronic hematogenous osteomyelitis, right ankle and foot: Secondary | ICD-10-CM | POA: Diagnosis not present

## 2014-07-10 DIAGNOSIS — L97411 Non-pressure chronic ulcer of right heel and midfoot limited to breakdown of skin: Secondary | ICD-10-CM | POA: Diagnosis not present

## 2014-07-10 DIAGNOSIS — S91301A Unspecified open wound, right foot, initial encounter: Secondary | ICD-10-CM | POA: Diagnosis not present

## 2014-07-10 DIAGNOSIS — E1140 Type 2 diabetes mellitus with diabetic neuropathy, unspecified: Secondary | ICD-10-CM | POA: Diagnosis not present

## 2014-07-10 DIAGNOSIS — E669 Obesity, unspecified: Secondary | ICD-10-CM | POA: Diagnosis not present

## 2014-07-11 ENCOUNTER — Ambulatory Visit: Payer: Self-pay | Admitting: Surgery

## 2014-07-11 DIAGNOSIS — L97519 Non-pressure chronic ulcer of other part of right foot with unspecified severity: Secondary | ICD-10-CM | POA: Diagnosis not present

## 2014-07-11 DIAGNOSIS — E11621 Type 2 diabetes mellitus with foot ulcer: Secondary | ICD-10-CM | POA: Diagnosis not present

## 2014-07-17 DIAGNOSIS — F17219 Nicotine dependence, cigarettes, with unspecified nicotine-induced disorders: Secondary | ICD-10-CM | POA: Diagnosis not present

## 2014-07-17 DIAGNOSIS — L97411 Non-pressure chronic ulcer of right heel and midfoot limited to breakdown of skin: Secondary | ICD-10-CM | POA: Diagnosis not present

## 2014-07-17 DIAGNOSIS — E669 Obesity, unspecified: Secondary | ICD-10-CM | POA: Diagnosis not present

## 2014-07-17 DIAGNOSIS — E1140 Type 2 diabetes mellitus with diabetic neuropathy, unspecified: Secondary | ICD-10-CM | POA: Diagnosis not present

## 2014-07-17 DIAGNOSIS — S91301A Unspecified open wound, right foot, initial encounter: Secondary | ICD-10-CM | POA: Diagnosis not present

## 2014-07-17 DIAGNOSIS — E11621 Type 2 diabetes mellitus with foot ulcer: Secondary | ICD-10-CM | POA: Diagnosis not present

## 2014-07-27 DIAGNOSIS — E669 Obesity, unspecified: Secondary | ICD-10-CM | POA: Diagnosis not present

## 2014-07-27 DIAGNOSIS — L97511 Non-pressure chronic ulcer of other part of right foot limited to breakdown of skin: Secondary | ICD-10-CM | POA: Diagnosis not present

## 2014-07-27 DIAGNOSIS — E1140 Type 2 diabetes mellitus with diabetic neuropathy, unspecified: Secondary | ICD-10-CM | POA: Diagnosis not present

## 2014-07-27 DIAGNOSIS — F17219 Nicotine dependence, cigarettes, with unspecified nicotine-induced disorders: Secondary | ICD-10-CM | POA: Diagnosis not present

## 2014-07-27 DIAGNOSIS — E11621 Type 2 diabetes mellitus with foot ulcer: Secondary | ICD-10-CM | POA: Diagnosis not present

## 2014-07-27 DIAGNOSIS — S91301A Unspecified open wound, right foot, initial encounter: Secondary | ICD-10-CM | POA: Diagnosis not present

## 2014-07-28 ENCOUNTER — Encounter: Payer: Self-pay | Admitting: Surgery

## 2014-07-28 DIAGNOSIS — E11621 Type 2 diabetes mellitus with foot ulcer: Secondary | ICD-10-CM | POA: Diagnosis not present

## 2014-07-28 DIAGNOSIS — E1140 Type 2 diabetes mellitus with diabetic neuropathy, unspecified: Secondary | ICD-10-CM | POA: Diagnosis not present

## 2014-07-28 DIAGNOSIS — E669 Obesity, unspecified: Secondary | ICD-10-CM | POA: Diagnosis not present

## 2014-07-28 DIAGNOSIS — F17219 Nicotine dependence, cigarettes, with unspecified nicotine-induced disorders: Secondary | ICD-10-CM | POA: Diagnosis not present

## 2014-07-28 DIAGNOSIS — S91301A Unspecified open wound, right foot, initial encounter: Secondary | ICD-10-CM | POA: Diagnosis not present

## 2014-07-31 IMAGING — CT CT ABD-PELV W/ CM
1 of 2 series · 14 of 32 positions shown, 18 images · IV contrast (isovue)
Comparison: none

REASON FOR EXAM: (1) ABD PAIN VOMITING X 2 DAYS; (2) PAIN
COMMENTS:

PROCEDURE:     CT  - CT ABDOMEN / PELVIS  W  - February 06, 2012  [DATE]
RESULT:     Comparison:  CT of the chest 07/07/2007
TECHNIQUE: Multiple axial images of the abdomen and pelvis were performed
from the lung bases to the pubic symphysis, without p.o. contrast and with
100 mL of Isovue 300 intravenous contrast.

[Series 2: 3mm soft tissue · axial · 0.88mm/px · z∈[-1105,-670]mm · 14 of 159 slices shown, 18 images]
[im 7/159  soft-tissue]
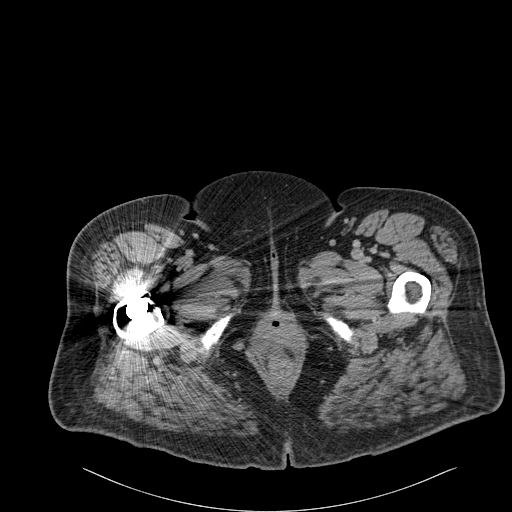
[im 7/159  bone]
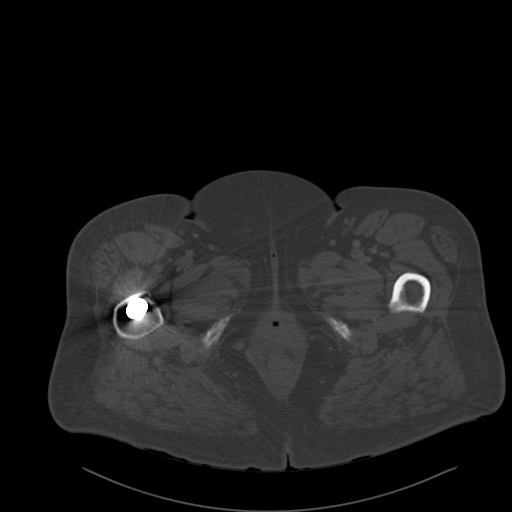
[im 21/159  soft-tissue]
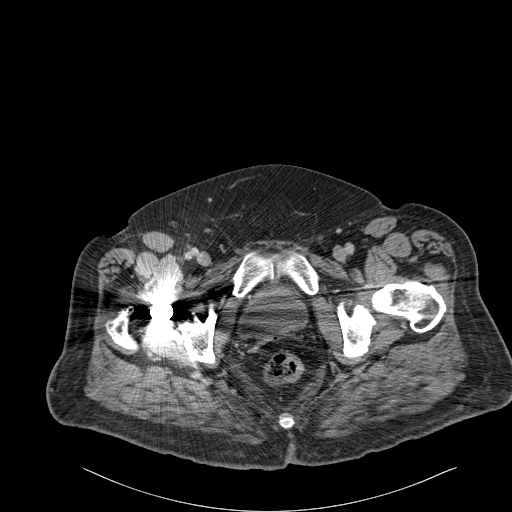
[im 35/159  soft-tissue]
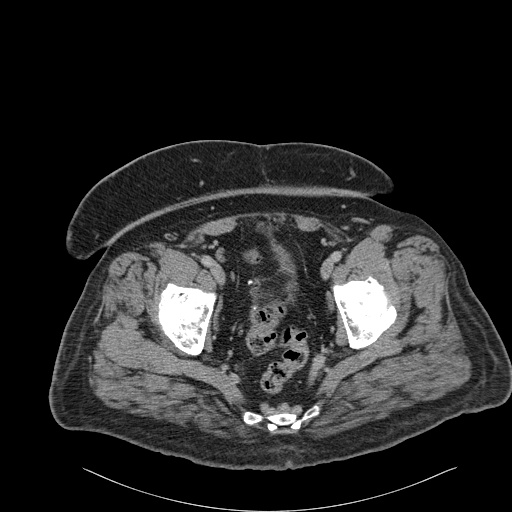
[im 49/159  soft-tissue]
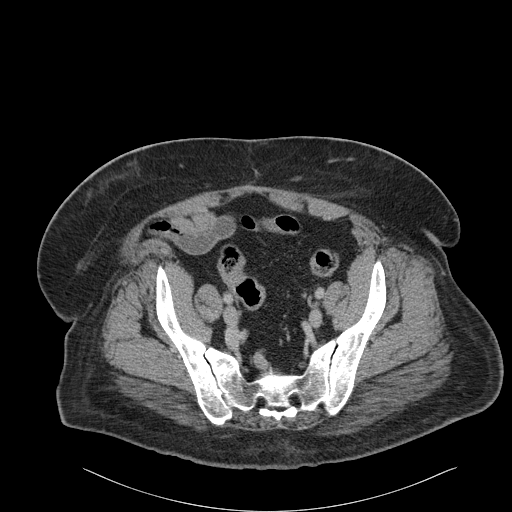
[im 62/159  soft-tissue]
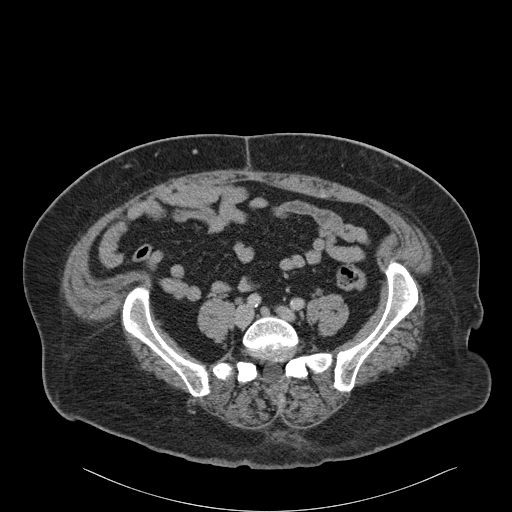
[im 76/159  soft-tissue]
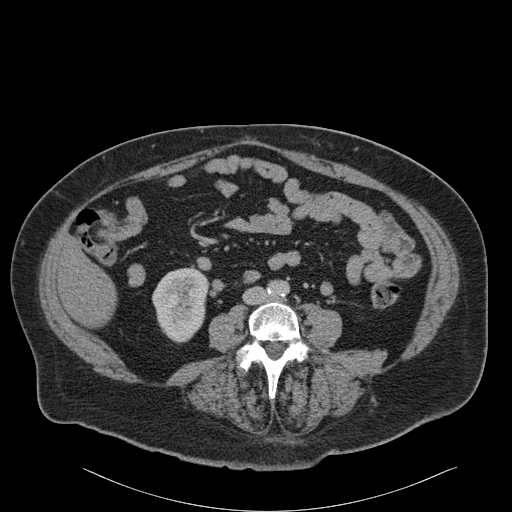
[im 83/159  soft-tissue]
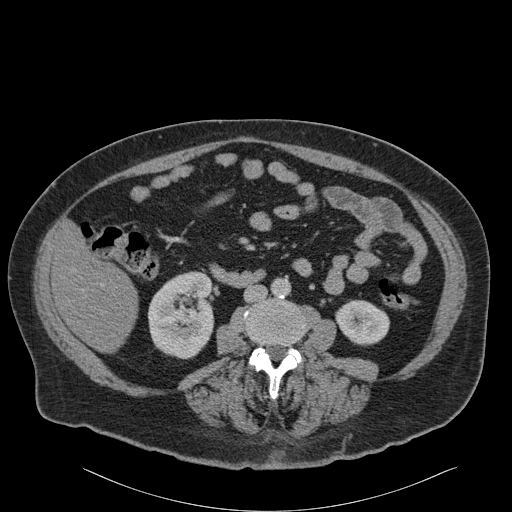
[im 97/159  soft-tissue]
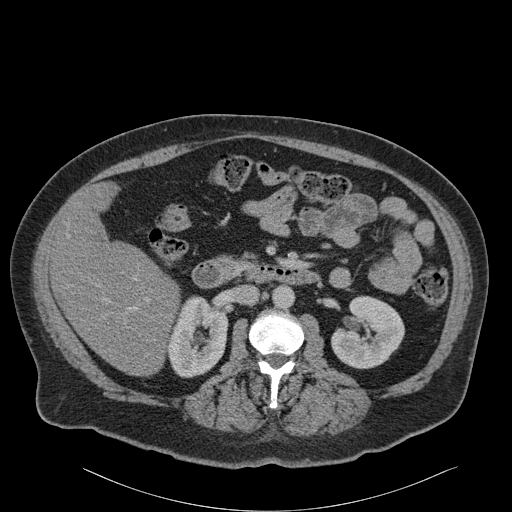
[im 110/159  soft-tissue]
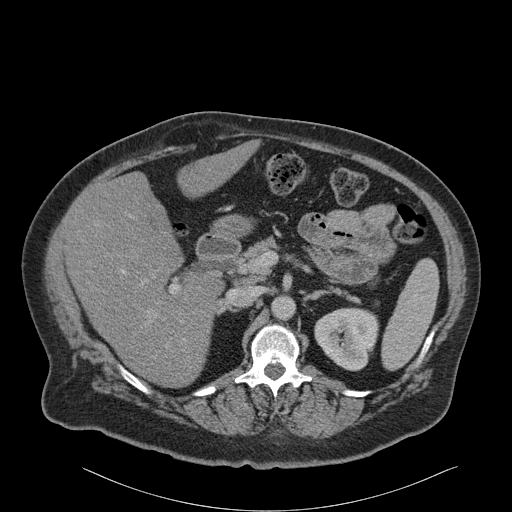
[im 110/159  bone]
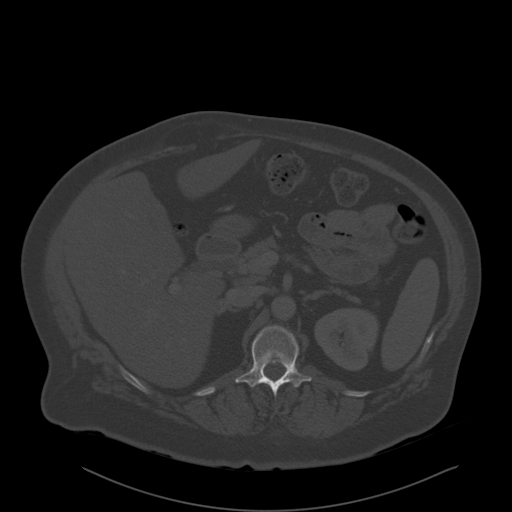
[im 124/159  soft-tissue]
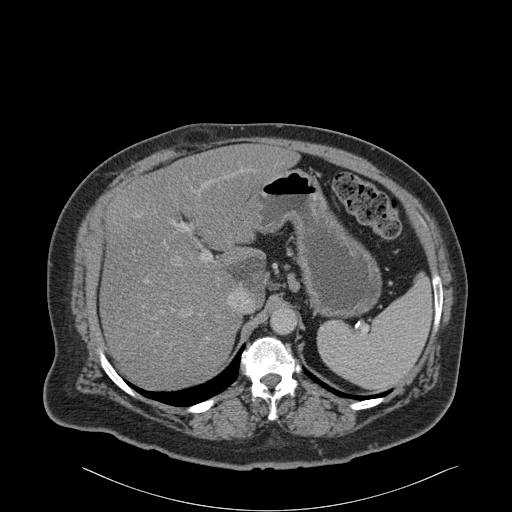
[im 131/159  lung]
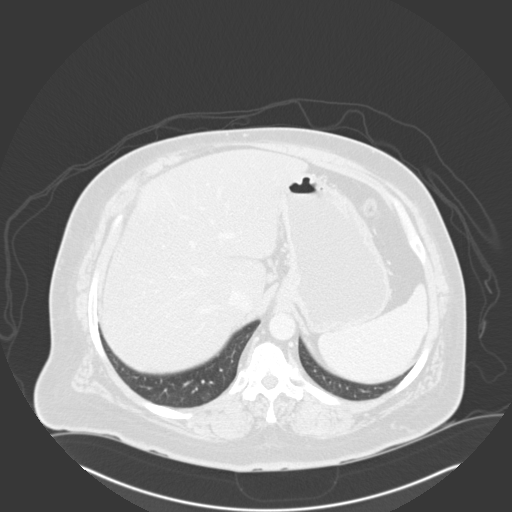
[im 138/159  soft-tissue]
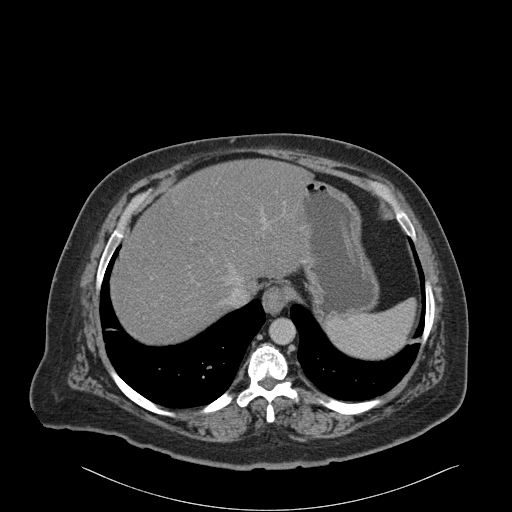
[im 138/159  lung]
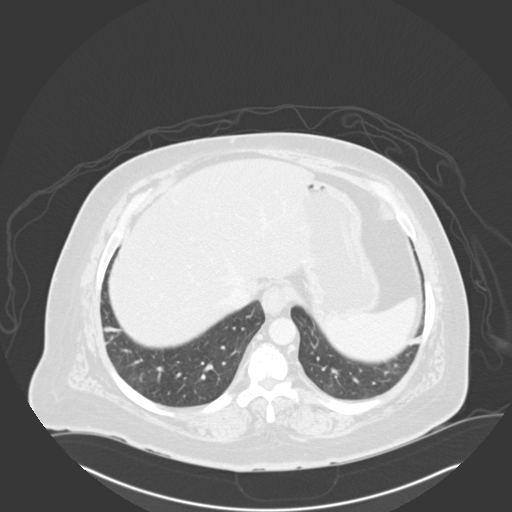
[im 145/159  lung]
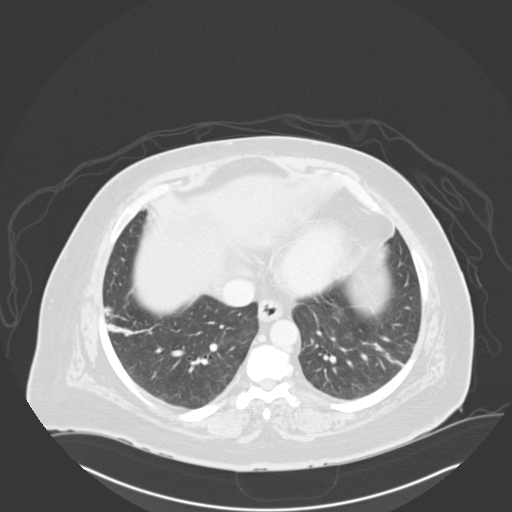
[im 152/159  soft-tissue]
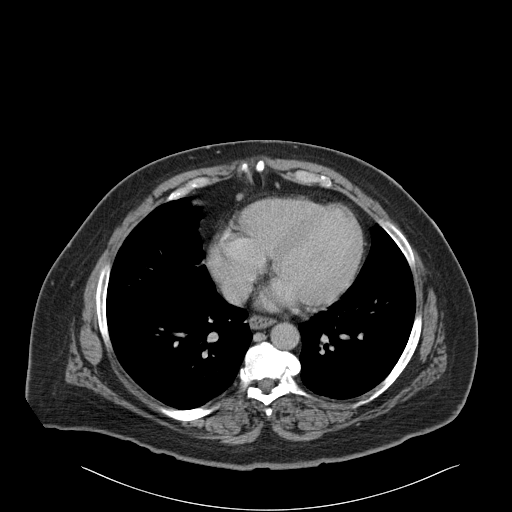
[im 152/159  lung]
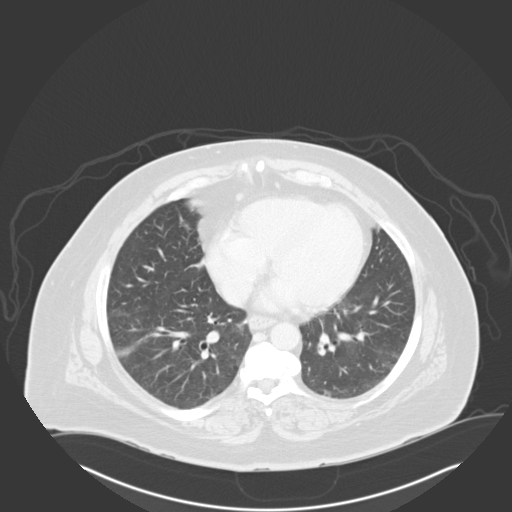

[14 of 32 positions shown; findings below may reference images not displayed]

FINDINGS: Mild basilar opacities are likely secondary to atelectasis. There are
calcifications in the coronary arteries.

The liver is relatively low in attenuation, consistent with hepatic
steatosis. There is a relatively ill-defined area of hypoattenuation in the
caudate lobe. This is indeterminate, but may represent a more focal area of
fatty infiltration. Small, subcentimeter low-attenuation focus in the right
hepatic lobe is too small to characterize. Small focus of low attenuation in
the anterior/inferior right hepatic lobe is too small to characterize. There
is a fat-containing ventral abdominal hernia to the right of midline
anterior to the left hepatic lobe. The spleen, left adrenal gland, and
pancreas are unremarkable. The gallbladder is not seen, and likely
surgically absent. There is a 1.6 cm nodule in the right adrenal gland which
is indeterminate. The this is similar in size to the prior chest CT. The
kidneys enhance normally.

Mild apparent thickening at the gastroesophageal junction may be secondary
to underdistention. The small and large bowel are normal in caliber.
Evaluation of the pelvis is limited by streak artifact from the right hip
arthroplasty hardware. The patient is status post hysterectomy. The appendix
is not visualized. However, there are no inflammatory changes at the base of
the cecum.

No aggressive lytic or sclerotic osseous lesions are identified.
IMPRESSION: 1. No acute findings in the abdomen or pelvis.
2. Ill-defined hypoattenuation in the caudate lobe of the liver could
represent more focal fatty deposition on a background of hepatic steatosis,
but is indeterminate. Further evaluation with nonemergent contrast-enhanced
abdominal MRI is recommended.
3. Small nodule in the right adrenal gland is indeterminate, but similar to
1443 and therefore likely benign. This could also be evaluated with the
aforementioned MRI.
4. Mild thickening of the distal esophagus at the gastroesophageal junction
may be secondary to underdistention or esophagitis. However, further
evaluation could be provided with endoscopy, as indicated.

[REDACTED]

## 2014-08-03 DIAGNOSIS — L97511 Non-pressure chronic ulcer of other part of right foot limited to breakdown of skin: Secondary | ICD-10-CM | POA: Diagnosis not present

## 2014-08-03 DIAGNOSIS — E669 Obesity, unspecified: Secondary | ICD-10-CM | POA: Diagnosis not present

## 2014-08-03 DIAGNOSIS — E11621 Type 2 diabetes mellitus with foot ulcer: Secondary | ICD-10-CM | POA: Diagnosis not present

## 2014-08-03 DIAGNOSIS — F17219 Nicotine dependence, cigarettes, with unspecified nicotine-induced disorders: Secondary | ICD-10-CM | POA: Diagnosis not present

## 2014-08-03 DIAGNOSIS — S91301A Unspecified open wound, right foot, initial encounter: Secondary | ICD-10-CM | POA: Diagnosis not present

## 2014-08-03 DIAGNOSIS — E1140 Type 2 diabetes mellitus with diabetic neuropathy, unspecified: Secondary | ICD-10-CM | POA: Diagnosis not present

## 2014-08-04 ENCOUNTER — Ambulatory Visit: Payer: Self-pay | Admitting: Pain Medicine

## 2014-08-04 DIAGNOSIS — M545 Low back pain: Secondary | ICD-10-CM | POA: Diagnosis not present

## 2014-08-04 DIAGNOSIS — M5416 Radiculopathy, lumbar region: Secondary | ICD-10-CM | POA: Diagnosis not present

## 2014-08-04 DIAGNOSIS — M169 Osteoarthritis of hip, unspecified: Secondary | ICD-10-CM | POA: Diagnosis not present

## 2014-08-04 DIAGNOSIS — M47817 Spondylosis without myelopathy or radiculopathy, lumbosacral region: Secondary | ICD-10-CM | POA: Diagnosis not present

## 2014-08-04 DIAGNOSIS — Z96641 Presence of right artificial hip joint: Secondary | ICD-10-CM | POA: Diagnosis not present

## 2014-08-04 DIAGNOSIS — M25551 Pain in right hip: Secondary | ICD-10-CM | POA: Diagnosis not present

## 2014-08-04 DIAGNOSIS — E1340 Other specified diabetes mellitus with diabetic neuropathy, unspecified: Secondary | ICD-10-CM | POA: Diagnosis not present

## 2014-08-04 DIAGNOSIS — M79671 Pain in right foot: Secondary | ICD-10-CM | POA: Diagnosis not present

## 2014-08-04 DIAGNOSIS — M25561 Pain in right knee: Secondary | ICD-10-CM | POA: Diagnosis not present

## 2014-08-04 DIAGNOSIS — F17200 Nicotine dependence, unspecified, uncomplicated: Secondary | ICD-10-CM | POA: Diagnosis not present

## 2014-08-04 DIAGNOSIS — E119 Type 2 diabetes mellitus without complications: Secondary | ICD-10-CM | POA: Diagnosis not present

## 2014-08-14 DIAGNOSIS — E1140 Type 2 diabetes mellitus with diabetic neuropathy, unspecified: Secondary | ICD-10-CM | POA: Diagnosis not present

## 2014-08-14 DIAGNOSIS — E11621 Type 2 diabetes mellitus with foot ulcer: Secondary | ICD-10-CM | POA: Diagnosis not present

## 2014-08-14 DIAGNOSIS — S91301A Unspecified open wound, right foot, initial encounter: Secondary | ICD-10-CM | POA: Diagnosis not present

## 2014-08-14 DIAGNOSIS — E669 Obesity, unspecified: Secondary | ICD-10-CM | POA: Diagnosis not present

## 2014-08-14 DIAGNOSIS — L84 Corns and callosities: Secondary | ICD-10-CM | POA: Diagnosis not present

## 2014-08-14 DIAGNOSIS — L97511 Non-pressure chronic ulcer of other part of right foot limited to breakdown of skin: Secondary | ICD-10-CM | POA: Diagnosis not present

## 2014-08-14 DIAGNOSIS — F17219 Nicotine dependence, cigarettes, with unspecified nicotine-induced disorders: Secondary | ICD-10-CM | POA: Diagnosis not present

## 2014-09-03 ENCOUNTER — Ambulatory Visit
Admit: 2014-09-03 | Disposition: A | Discharge status: Home / Self Care | Payer: Self-pay | Attending: Pain Medicine | Admitting: Pain Medicine

## 2014-09-03 DIAGNOSIS — M169 Osteoarthritis of hip, unspecified: Secondary | ICD-10-CM | POA: Diagnosis not present

## 2014-09-03 DIAGNOSIS — M47817 Spondylosis without myelopathy or radiculopathy, lumbosacral region: Secondary | ICD-10-CM | POA: Diagnosis not present

## 2014-09-03 DIAGNOSIS — Z96641 Presence of right artificial hip joint: Secondary | ICD-10-CM | POA: Diagnosis not present

## 2014-09-03 DIAGNOSIS — E1140 Type 2 diabetes mellitus with diabetic neuropathy, unspecified: Secondary | ICD-10-CM | POA: Diagnosis not present

## 2014-09-03 DIAGNOSIS — M47896 Other spondylosis, lumbar region: Secondary | ICD-10-CM | POA: Diagnosis not present

## 2014-09-03 DIAGNOSIS — M5416 Radiculopathy, lumbar region: Secondary | ICD-10-CM | POA: Diagnosis not present

## 2014-09-03 DIAGNOSIS — E1340 Other specified diabetes mellitus with diabetic neuropathy, unspecified: Secondary | ICD-10-CM | POA: Diagnosis not present

## 2014-09-15 NOTE — Consult Note (Signed)
General Aspect 54 year old pleasant Caucasian female with past medical history of diabetes mellitus on insulin, long smoking hx for 40 years, 1 ppd,  obesity, presenting with 2 days of  vomiting without hematemesis, occasional burning sensation in the epigastric area but no chest pain. admitted with UTI. Cardiology was consulted for elevated cardiac enz.    The patient had dysuria and frequency of urination associated with low-grade fever, vomiting. Workup in ER significant for significant urinary tract infection. She denies any heart probloems in the past. No UTIs in the past.In the ER she was taxchcardic with rates to 100 bpm.  CT scan of chest showing significant LAD coronary calcification    Present Illness . PAST MEDICAL HISTORY:  1. Diabetes mellitus type II, on insulin.  2. Chronic back pain and joint pains.  3. 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  4. Chronic pain syndrome, [facility] up closely [facility].  5. Chronic narcotic use for pain control.  6. History of palpitations for which she was placed on atenolol.  PAST SURGICAL HISTORY:  1. Hysterectomy.  2. Cholecystectomy.  3. Right hip joint replacement.    FAMILY HISTORY: Her father died at age of 58 after having seizure activity. Her mother died at the age of 47. She suffered from emphysema and diabetes mellitus. She has a sister who died at the age of 73 with a heart problem that was not well identified.   SOCIAL HABITS: Chronic smoker of one pack per day since age of 18. No history of alcohol or drug abuse.   SOCIAL HISTORY: She is widowed. Lives at home with her children. She is unemployed.   Physical Exam:   GEN well developed, well nourished, no acute distress, obese    HEENT red conjunctivae    NECK supple  No masses    RESP normal resp effort  clear BS    CARD Regular rate and rhythm  No murmur    ABD denies tenderness  soft   Review of Systems:    Subjective/Chief Complaint vomiting, stomach discomfort, dysuria    General: Fever/chills  Weakness    Skin: No Complaints    ENT: No Complaints    Eyes: No Complaints    Neck: No Complaints    Respiratory: No Complaints    Cardiovascular: No Complaints    Gastrointestinal: No Complaints    Musculoskeletal: Muscle or Joint Stiffness    Neurologic: No Complaints    Hematologic: No Complaints    Endocrine: No Complaints    Psychiatric: No Complaints    Review of Systems: All other systems were reviewed and found to be negative    Medications/Allergies Reviewed Medications/Allergies reviewed     Diabetes:    cholecystectomy:    Hysterectomy - Total:    Right Hip Replacement:        Admit Diagnosis:   NON Q WAVE MYOCARDIAL INFARCTION: 07-Feb-2012, Active, NON Q WAVE MYOCARDIAL INFARCTION      Admit Reason:   Non-Q wave myocardial infarction: (410.70) 06-Feb-2012, Active, ICD9, Acute myocardial infarction, subendocardial infarction, episode of care unspecified  Home Medications: Medication Instructions Status  atenolol 25 mg oral tablet 1 tab(s) orally once a day Active  aspirin 325 mg oral tablet 1 tab(s) orally once a day Active  Lantus 100 units/mL subcutaneous solution 100 unit(s) subcutaneous 2 times a day Active  lorazepam 1 mg oral tablet 1 tab(s) orally 2 times a day, As Needed- for Anxiety, Nervousness  Active  methadone 10 mg oral tablet 4-5 tab(s) orally once a day (in the morning) and 2-3 once a day (in the evening) Active  oxycodone 5 mg oral tablet 1 tab(s) orally every 4 to 6 hours Active   Lab Results:  Hepatic:  10-Sep-13 19:55    Bilirubin, Total 0.5   Alkaline Phosphatase 97   SGPT (ALT) 25   SGOT (AST) 17   Total Protein, Serum  8.3   Albumin, Serum 3.5  Routine Chem:  10-Sep-13 19:55    Result Comment troponin - RESULTS VERIFIED BY REPEAT TESTING.  - ljw c/ Antonietta Breach 22:05 02-06-2012  - READ-BACK PROCESS PERFORMED.  Result(s)  reported on 06 Feb 2012 at 10:05PM.   Glucose, Serum  185   BUN 11   Creatinine (comp) 0.62   Sodium, Serum 139   Potassium, Serum 3.9   Chloride, Serum 101   CO2, Serum 30   Calcium (Total), Serum 9.1   Osmolality (calc) 282   eGFR (African American) >60   eGFR (Non-African American) >60 (eGFR values <48m/min/1.73 m2 may be an indication of chronic kidney disease (CKD). Calculated eGFR is useful in patients with stable renal function. The eGFR calculation will not be reliable in acutely ill patients when serum creatinine is changing rapidly. It is not useful in  patients on dialysis. The eGFR calculation may not be applicable to patients at the low and high extremes of body sizes, pregnant women, and vegetarians.)   Anion Gap 8   Lipase  43 (Result(s) reported on 06 Feb 2012 at 08:23PM.)  11-Sep-13 03:02    Result Comment TROPONIN - RESULTS VERIFIED BY REPEAT TESTING.  - PREV CALLED 9/10/13AT [id number]  Result(s) reported on 07 Feb 2012 at 04:03AM.   Cholesterol, Serum 150   Triglycerides, Serum  279   HDL (INHOUSE)  14   VLDL Cholesterol Calculated  56   LDL Cholesterol Calculated 80 (Result(s) reported on 07 Feb 2012 at 03:49AM.)  Cardiac:  10-Sep-13 19:55    Troponin I  0.12 (0.00-0.05 0.05 ng/mL or less: NEGATIVE  Repeat testing in 3-6 hrs  if clinically indicated. >0.05 ng/mL: POTENTIAL  MYOCARDIAL INJURY. Repeat  testing in 3-6 hrs if  clinically indicated. NOTE: An increase or decrease  of 30% or more on serial  testing suggests a  clinically important change)  11-Sep-13 03:02    Troponin I  0.12 (0.00-0.05 0.05 ng/mL or less: NEGATIVE  Repeat testing in 3-6 hrs  if clinically indicated. >0.05 ng/mL: POTENTIAL  MYOCARDIAL INJURY. Repeat  testing in 3-6 hrs if  clinically indicated. NOTE: An increase or decrease  of 30% or more on serial  testing suggests a  clinically important change)   CK, Total 89   CPK-MB, Serum 1.6 (Result(s) reported on 07 Feb 2012 at 04:00AM.)  Routine Coag:  11-Sep-13 03:02    Activated PTT (APTT)  54.7 (A HCT value >55% may artifactually increase the APTT. In one study, the increase was an average of 19%. Reference: "Effect on Routine and Special Coagulation Testing Values of Citrate Anticoagulant Adjustment in Patients with High HCT Values." American Journal of Clinical Pathology 2006;126:400-405.)  Routine Hem:  10-Sep-13 19:55    WBC (CBC)  13.3   RBC (CBC)  5.69   Hemoglobin (CBC) 15.7   Hematocrit (CBC)  47.5   Platelet Count (CBC) 151 (Result(s) reported on 06 Feb 2012 at 08:18PM.)   MCV 83   MCH 27.6   MCHC 33.1  RDW  15.7   EKG:   Interpretation initial EKG shows N SR with rate 99, RBBB, no significant ST or T wave changes    Mobic: N/V, Itching, Swelling  Sulfa: Unknown  Vital Signs/Nurse's Notes: **Vital Signs.:   11-Sep-13 07:49   Vital Signs Type Routine   Temperature Source oral   Pulse Pulse 72   Respirations Respirations 20   Systolic BP Systolic BP 96   Diastolic BP (mmHg) Diastolic BP (mmHg) 60   Mean BP 72   Pulse Ox % Pulse Ox % 98   Pulse Ox Activity Level  At rest   Oxygen Delivery 2L   Pulse Ox Heart Rate 72     Impression 54 year old pleasant Caucasian female with past medical history of diabetes mellitus on insulin, long smoking hx for 40 years, 1 ppd,  obesity, presenting with 2 days of  vomiting without hematemesis, occasional burning sensation in the epigastric area but no chest pain. admitted with UTI. Cardiology was consulted for elevated cardiac enz.   1) Elevated cardiac enz: tachycardia on arrival Troponin has not changed  no chest pain ECHO essentially normal No further testing at this time. of concern, CT chest showing LAD coronary atherosclerosis. She is high risk of CAD given long smoking hx and diabetes. --Would start aspirin 81 mg daily, statin, already on a b-blocker --Close follow up in our office  2) UTI: feels better, on AB X Cx  pending, no vomiting  3) Counselled on smoking cessation: grater than 5 minutes Daughter also smokes. Talked with daughtrer as well.  Needs exercise, weight loss, no smoking, good diabetes control.  4) HTN: on atenolol. will watch. low this AM   Electronic Signatures: Ida Rogue (MD)  (Signed 11-Sep-13 08:56)  Authored: General Aspect/Present Illness, History and Physical Exam, Review of System, Past Medical History, Health Issues, Home Medications, Labs, EKG , Allergies, Vital Signs/Nurse's Notes, Impression/Plan   Last Updated: 11-Sep-13 08:56 by Ida Rogue (MD)

## 2014-09-15 NOTE — H&P (Signed)
PATIENT NAME:  Amy Meyers, PATES MR#:  [id number] DATE OF BIRTH:  19-Mar-1961  DATE OF ADMISSION:  02/06/2012  PRIMARY CARE PHYSICIAN: Margarita Rana, MD    CHIEF COMPLAINT: Repetitive vomiting, frequency of urination, dysuria, and fever.   HISTORY OF PRESENT ILLNESS: Amy Meyers is a 54 year old pleasant Caucasian female with past medical history of diabetes mellitus on insulin who was in her usual state of health until about two days ago when she started having repetitive vomiting without hematemesis. No coffee-ground emesis. Along with that she has occasional burning sensation in the epigastric area but no chest pain. No shortness of breath. The patient also noticed having dysuria and frequency of urination associated with low-grade fever. Finally she decided to come to the Emergency Department because of the vomiting and evaluation here was consistent with elevation of troponin. Additionally, she has significant urinary tract infection. The patient was admitted for further evaluation and treatment.   REVIEW OF SYSTEMS: CONSTITUTIONAL: Reports fever, some chills, and mild fatigue. EYES: No blurring of vision. No double vision. ENT: No hearing impairment. No sore throat. No dysphagia. CARDIOVASCULAR: No chest pain. No shortness of breath. No edema. No syncope. RESPIRATORY: No shortness of breath. No chest pain. No cough. No hemoptysis. GASTROINTESTINAL: Reports epigastric burning sensation and repetitive vomiting. No diarrhea. No melena or hematochezia. GENITOURINARY: Reports dysuria and frequency of urination. MUSCULOSKELETAL: She has chronic back pain and joint pains. No joint swelling. No muscular pain or swelling. INTEGUMENTARY: No skin rash. No ulcers. NEUROLOGY: No focal weakness. No seizure activity. No headache. PSYCHIATRY: No depression. Currently has no anxiety but she has history of anxiety. ENDOCRINE: No polyuria or polydipsia other than the frequency of urination. No heat or cold intolerance.   PAST  MEDICAL HISTORY:  1. Diabetes mellitus type II, on insulin.  2. Chronic back pain and joint pains.  3. 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  4. Chronic pain syndrome, followed up closely at the [facility].  5. Chronic narcotic use for pain control.  6. History of palpitations for which she was placed on atenolol.  PAST SURGICAL HISTORY:  1. Hysterectomy.  2. Cholecystectomy.  3. Right hip joint replacement.    FAMILY HISTORY: Her father died at age of 31 after having seizure activity. Her mother died at the age of 27. She suffered from emphysema and diabetes mellitus. She has a sister who died at the age of 35 with a heart problem that was not well identified.   SOCIAL HABITS: Chronic smoker of one pack per day since age of 35. No history of alcohol or drug abuse.   SOCIAL HISTORY: She is widowed. Lives at home with her children. She is unemployed.   ADMISSION MEDICATIONS:  1. Oxycodone 5 mg 4 to 5 times a day p.r.n.  2. Methadone 10 mg tablet taking average 7 tablets a day, maybe 4 in the morning and 3 in the evening.  3. Lorazepam 1 mg twice a day p.r.n. for anxiety. 4. Lantus 100 units twice a day. 5. Atenolol 25 mg a day. 6. Aspirin 325 mg a day.   ALLERGIES: Sulfa causes skin rash and itching. Mobic causes nausea and vomiting.   PHYSICAL EXAMINATION:   VITAL SIGNS: Blood pressure 151/79, pulse 114, respiratory rate 10, temperature 99.2, oxygen saturation 96%.   GENERAL APPEARANCE: Middle-aged female laying in bed in no acute distress.   HEAD: No pallor. No icterus. No cyanosis.   EARS, NOSE, AND THROAT: Hearing was  normal. Nasal mucosa, lips, tongue were normal.   EYES: Normal eyelids and conjunctivae. Pupils about 5 mm, equal and reactive to light.   NECK: Supple. Trachea at midline. No thyromegaly. No cervical lymphadenopathy. No masses.   HEART: Normal S1, S2. No S3, S4. No murmur. No gallop. No carotid bruits.   RESPIRATORY:  Normal breathing pattern without use of accessory muscles. No rales. No wheezing.   ABDOMEN: Soft without tenderness. No hepatosplenomegaly. No masses. No hernias.   SKIN: No ulcers. No subcutaneous nodules.   MUSCULOSKELETAL: No joint swelling. No clubbing.   NEUROLOGIC: Cranial nerves II through XII are intact. No focal motor deficit.   PSYCHIATRIC: The patient is alert and oriented x3. Mood and affect were normal.   LABORATORY, DIAGNOSTIC, AND RADIOLOGICAL DATA: CAT scan of the abdomen showed no acute findings. Ill-defined hypoattenuation in the caudate lobe of the liver, may represent focal fatty deposition but is indeterminate. Further evaluation with MRI is recommended. Small nodule in the right adrenal gland is indeterminate. Mild thickening of the distal esophagus at the gastroesophageal junction may be secondary to underdistention of the esophagus or esophagitis.   EKG showed normal sinus rhythm at a rate 96 per minute, right bundle branch block, otherwise unremarkable EKG.  Serum glucose 185, BUN 11, creatinine 0.6, sodium 139, potassium 3.9. Lipase 43. Normal liver function tests. Troponin is elevated at 0.12. CBC showed white count of 13,000, hemoglobin 15, hematocrit 47, platelet count 151. Urinalysis showed turbid urine, 942 white blood cells, 3 RBCs, +3 leukocyte esterase +2 bacteria.   ASSESSMENT:  1. Elevated troponin likely secondary to non-ST elevation myocardial infarction or acute non-ST elevation myocardial infarction.  2. Vomiting. It is unclear whether it is related to primary GI problem or to the cardiac event.  3. Significant urinary tract infection.  4. Diabetes mellitus, type II.  5. Chronic back pain and degenerative joint disease with chronic narcotic use.  6. Obesity.  7. History of cholecystectomy, hysterectomy, and right hip replacement.   PLAN:  1. Will admit the patient to the Intensive Care Unit.  2. IV heparin protocol will be  initiated. 3. Enteric-coated aspirin 325 mg once a day.  4. Will change atenolol to metoprolol 25 mg q.6 hours p.r.n. with blood pressure and heart rate parameters.  5. Nitroglycerin sublingual p.r.n.  6. Check lipid profile and start atorvastatin at 20 mg once a day.  7. Obtain echocardiogram.  8. Cardiology consultation.  9. Regarding her urinary tract infection, urine culture was sent as well as two blood cultures. IV ciprofloxacin initiated.  10. I will hold Lantus for the time being until I am sure that she is able to eat and drink. Will place her on Accu-Cheks and sliding scale.   TIME SPENT EVALUATING THIS PATIENT: More than one hour.   ____________________________ Clovis Pu. Lenore Manner, MD amd:drc D: 02/06/2012 23:27:44 ET T: 02/07/2012 07:16:14 ET JOB#: [id number]  cc: Clovis Pu. Lenore Manner, MD, <Dictator> Jerrell Belfast, MD Mike Craze Irven Coe MD ELECTRONICALLY SIGNED 02/07/2012 22:28

## 2014-09-15 NOTE — Discharge Summary (Signed)
PATIENT NAME:  Amy Meyers, MATCHETT MR#:  [id number] DATE OF BIRTH:  1960-09-18  DATE OF ADMISSION:  02/06/2012 DATE OF DISCHARGE:  02/08/2012  DIAGNOSES: 1. Nausea, vomiting, abdominal pain due to Escherichia coli urinary tract infection.  2. Leukocytosis.  3. Elevated troponin likely due to demand ischemia with no evidence of NSTEMI. 4. Diabetes.  5. Hyperlipidemia.  6. History of palpitations, on beta-blocker. 7. Chronic pain syndrome. 8. Smoking.   DISPOSITION: The patient is being discharged home.   FOLLOWUP: Follow-up with primary care physician, Dr. Venia Minks, in 1 to 2 weeks after discharge.   DIET: 1800 calorie ADA diet.   ACTIVITY: As tolerated.   DISCHARGE MEDICATIONS:  1. Atenolol 25 mg daily.  2. Aspirin 325 mg daily.  3. Lantus 100 units subcutaneously b.i.d.  4. Ativan 1 mg b.i.d. p.r.n.  5. Methadone 10 mg 4 to 5 tablets once a day in the morning and 2 to 3 tablets once a day in the evening.  6. Oxycodone 5 mg every 4 to 6 hours p.r.n.  7. Lipitor 20 mg daily.  8. Cipro 500 mg b.i.d.   LABORATORY, DIAGNOSTIC, AND RADIOLOGICAL DATA: Urine culture grew Escherichia coli urinary tract infection. Blood culture showed no growth.   Chest CT abdomen and pelvis showed hepatic steatosis and no other acute abnormalities. There was a small nodule in the right adrenal gland which was indeterminate but similar to 2009, therefore likely representing benign finding.   White count 13.3, hemoglobin 15.7, platelet count 151, glucose 185. BUN and creatinine normal. Electrolytes normal. VLDL 56. LDL 80. Cholesterol 150. Triglycerides 279. HDL 14. Cardiac enzymes 0.12 to 0.10.   Echo normal study, EF more than 55%. Left ventricular systolic function is normal. Right ventricular systolic function is normal. Left atrial size is normal. Right ventricular systolic pressure is normal.    HOSPITAL COURSE: The patient is a 54 year old female with past medical history of chronic pain syndrome,  diabetes, palpitations on beta-blocker who presented with nausea, vomiting, and abdominal pain. She had elevated troponins but on checking of serial cardiac enzymes she did not have any evidence of NSTEMI. She was found to have Escherichia coli urinary tract infection and was initially treated with IV antibiotics and subsequently switched to oral antibiotics. Her blood cultures have been negative so far. Her nausea, vomiting, and abdominal pain were likely due to urinary tract infection. CT of the abdomen showed hepatic steatosis, small stable adrenal nodule, and no other significant abnormalities. Fasting lipid profile was checked. She did have elevated triglycerides and VLDL and she is on statin therapy. She was extensively counseled about smoking cessation. A Cardiology consultation with Dr. Rockey Situ was obtained because of elevated cardiac enzymes who felt that this was due to demand ischemia especially since her echo was a normal study.   DISPOSITION: The patient is being discharged home in a stable condition.   TIME SPENT: 45 minutes.   ____________________________ Cherre Huger, MD sp:drc D: 02/08/2012 16:20:41 ET T: 02/09/2012 10:42:08 ET JOB#: [id number]  cc: Cherre Huger, MD, <Dictator> Jerrell Belfast, MD Cherre Huger MD ELECTRONICALLY SIGNED 02/09/2012 14:44

## 2014-09-29 ENCOUNTER — Encounter: Payer: Self-pay | Admitting: Pain Medicine

## 2014-09-29 ENCOUNTER — Ambulatory Visit: Payer: Medicare Other | Attending: Pain Medicine | Admitting: Pain Medicine

## 2014-09-29 VITALS — BP 125/65 | HR 90 | Temp 98.9°F | Resp 18 | Ht 66.0 in | Wt 225.0 lb

## 2014-09-29 DIAGNOSIS — M169 Osteoarthritis of hip, unspecified: Secondary | ICD-10-CM | POA: Diagnosis not present

## 2014-09-29 DIAGNOSIS — M47817 Spondylosis without myelopathy or radiculopathy, lumbosacral region: Secondary | ICD-10-CM | POA: Diagnosis not present

## 2014-09-29 DIAGNOSIS — E1140 Type 2 diabetes mellitus with diabetic neuropathy, unspecified: Secondary | ICD-10-CM | POA: Insufficient documentation

## 2014-09-29 DIAGNOSIS — M5136 Other intervertebral disc degeneration, lumbar region: Secondary | ICD-10-CM | POA: Insufficient documentation

## 2014-09-29 DIAGNOSIS — E0840 Diabetes mellitus due to underlying condition with diabetic neuropathy, unspecified: Secondary | ICD-10-CM | POA: Insufficient documentation

## 2014-09-29 DIAGNOSIS — M5135 Other intervertebral disc degeneration, thoracolumbar region: Secondary | ICD-10-CM

## 2014-09-29 DIAGNOSIS — M25551 Pain in right hip: Secondary | ICD-10-CM | POA: Diagnosis present

## 2014-09-29 DIAGNOSIS — M1611 Unilateral primary osteoarthritis, right hip: Secondary | ICD-10-CM

## 2014-09-29 DIAGNOSIS — E1340 Other specified diabetes mellitus with diabetic neuropathy, unspecified: Secondary | ICD-10-CM | POA: Diagnosis not present

## 2014-09-29 DIAGNOSIS — M179 Osteoarthritis of knee, unspecified: Secondary | ICD-10-CM | POA: Diagnosis not present

## 2014-09-29 DIAGNOSIS — M5416 Radiculopathy, lumbar region: Secondary | ICD-10-CM | POA: Diagnosis not present

## 2014-09-29 MED ORDER — METHADONE HCL 10 MG PO TABS: ORAL_TABLET | ORAL | Status: DC

## 2014-09-29 MED ORDER — OXYCODONE HCL 5 MG PO TABS: ORAL_TABLET | ORAL | Status: DC

## 2014-09-29 NOTE — Patient Instructions (Signed)
Methadone Oxycodone

## 2014-09-29 NOTE — Progress Notes (Signed)
   Subjective:    Patient ID: Amy Meyers, female    DOB: [birth year], 54 y.o.   MRN: [id number]  Hip Pain  The incident occurred more than 1 week ago. There was no injury mechanism. The pain is present in the right hip and right knee. The quality of the pain is described as aching, burning, cramping and stabbing. The pain is at a severity of 7/10. The pain is moderate. The pain has been constant since onset. Associated symptoms include an inability to bear weight, a loss of motion, a loss of sensation, muscle weakness, numbness and tingling. The symptoms are aggravated by movement, palpation and weight bearing. She has tried elevation, heat, acetaminophen, ice, immobilization, non-weight bearing, NSAIDs and rest for the symptoms. The treatment provided moderate relief.  Back Pain Associated symptoms include numbness, tingling and weakness.  Knee Pain  Associated symptoms include an inability to bear weight, a loss of motion, a loss of sensation, muscle weakness, numbness and tingling.  Foot Pain Associated symptoms include joint swelling, myalgias, numbness and weakness.  ulceration of foot     Review of Systems  Musculoskeletal: Positive for myalgias, back pain, joint swelling and gait problem.  Neurological: Positive for tingling, weakness and numbness.       Objective:   Physical Exam  Constitutional: She is oriented to person, place, and time.  HENT:  Head: Normocephalic.  Neck: Neck supple.  Cardiovascular: Normal rate.   Abdominal: She exhibits no distension and no mass. There is no tenderness. There is no rebound and no guarding.  Musculoskeletal: She exhibits tenderness. She exhibits no edema.  Neurological: She is alert and oriented to person, place, and time.          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

## 2014-09-30 NOTE — Addendum Note (Signed)
Addended by: Morley Kos on: 09/30/2014 04:05 PM   Modules accepted: Level of Service

## 2014-10-29 ENCOUNTER — Encounter: Payer: Self-pay | Admitting: Pain Medicine

## 2014-10-29 ENCOUNTER — Ambulatory Visit: Payer: Medicare Other | Attending: Pain Medicine | Admitting: Pain Medicine

## 2014-10-29 VITALS — BP 136/68 | HR 94 | Temp 98.3°F | Resp 18 | Ht 66.0 in | Wt 225.0 lb

## 2014-10-29 DIAGNOSIS — M169 Osteoarthritis of hip, unspecified: Secondary | ICD-10-CM | POA: Diagnosis not present

## 2014-10-29 DIAGNOSIS — M545 Low back pain: Secondary | ICD-10-CM | POA: Diagnosis present

## 2014-10-29 DIAGNOSIS — E1140 Type 2 diabetes mellitus with diabetic neuropathy, unspecified: Secondary | ICD-10-CM | POA: Diagnosis not present

## 2014-10-29 DIAGNOSIS — M47817 Spondylosis without myelopathy or radiculopathy, lumbosacral region: Secondary | ICD-10-CM | POA: Diagnosis not present

## 2014-10-29 DIAGNOSIS — M533 Sacrococcygeal disorders, not elsewhere classified: Secondary | ICD-10-CM | POA: Insufficient documentation

## 2014-10-29 DIAGNOSIS — Z96641 Presence of right artificial hip joint: Secondary | ICD-10-CM | POA: Insufficient documentation

## 2014-10-29 DIAGNOSIS — M179 Osteoarthritis of knee, unspecified: Secondary | ICD-10-CM | POA: Insufficient documentation

## 2014-10-29 DIAGNOSIS — M160 Bilateral primary osteoarthritis of hip: Secondary | ICD-10-CM

## 2014-10-29 DIAGNOSIS — M5135 Other intervertebral disc degeneration, thoracolumbar region: Secondary | ICD-10-CM

## 2014-10-29 DIAGNOSIS — M5416 Radiculopathy, lumbar region: Secondary | ICD-10-CM | POA: Diagnosis not present

## 2014-10-29 DIAGNOSIS — E0840 Diabetes mellitus due to underlying condition with diabetic neuropathy, unspecified: Secondary | ICD-10-CM

## 2014-10-29 DIAGNOSIS — M1610 Unilateral primary osteoarthritis, unspecified hip: Secondary | ICD-10-CM | POA: Diagnosis not present

## 2014-10-29 DIAGNOSIS — E1340 Other specified diabetes mellitus with diabetic neuropathy, unspecified: Secondary | ICD-10-CM | POA: Diagnosis not present

## 2014-10-29 MED ORDER — METHADONE HCL 10 MG PO TABS: ORAL_TABLET | ORAL | Status: DC

## 2014-10-29 MED ORDER — OXYCODONE HCL 5 MG PO TABS: ORAL_TABLET | ORAL | Status: DC

## 2014-10-29 NOTE — Progress Notes (Signed)
   Subjective:    Patient ID: Amy Meyers, female    DOB: 08-02-1960, 54 y.o.   MRN: [id number]  HPI  Patient is 54 year old female returns to [place] for further evaluation and treatment of pain involving the lower back lower extremity region predominantly. Patient is status post right total hip replacement. Patient also with history of degenerative joint disease of the right knee. At the present time patient appears to be with pain fairly well-controlled. Patient also is with diabetes mellitus with history of ulcerations of the lower extremities which has improved significantly with prior lumbar sympathetic blocks. At the present time patient is with pain fairly well-controlled and is without lower extremity lesions. We will continue present medications and remain buttocks consider modification of treatment pending follow-up evaluation.   Review of Systems     Objective:   Physical Exam  There was mild tinnitus of the splenius capitis and occipitalis musculature regions. Palpation of the acromioclavicular and glenohumeral joint region reproduced mild discomfort as well. Tinel and Phalen's maneuver associated with minimal increase of pain and patient appeared to be with bilaterally equal grip strength. There was unremarkable Spurling's maneuver. Palpation of the thoracic region thoracic facet region associated tends to palpation of minimal degree with no crepitus of the thoracic region noted. Palpation of the lumbar paraspinal musculature region lumbar facet region associated with moderate discomfort. Lateral bending rotation extension and palpation lumbar facets reproduce moderate discomfort on the right greater than left. There was tenderness along the greater trochanteric region iliotibial band region right greater than left. Straight leg raising tolerated to 30 without an increase of pain with dorsiflexion noted. There was negative clonus negative Homans. Examination right knee the  patient to be with crepitus of the knee with negative anterior and posterior drawer signs and without ballottement of the patella. Abdomen nontender. No costovertebral maintenance noted.    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  F/U PCP for evaliation of  BP and general medical  condition.  F/U surgical evaluation.  F/U neurological evaluation.  May consider radiofrequency rhizolysis or intraspinal procedures pending response to present treatment and F/U evaluation.  Patient to call [facility] should patient have concerns prior to scheduled return appointment.

## 2014-10-29 NOTE — Patient Instructions (Signed)
Continue present medications.  F/U PCP for evaliation of  BP and general medical  condition.  F/U surgical evaluation.  F/U neurological evaluation.  May consider radiofrequency rhizolysis or intraspinal procedures pending response to present treatment and F/U evaluation.  Patient to call [facility] should patient have concerns prior to scheduled return appointment.  

## 2014-10-29 NOTE — Progress Notes (Signed)
Discharged to home via ambulatory with script in hand for oxycodone and methadone.  Will return in 1 month. Safety precautions to be maintained throughout the outpatient stay will include: orient to surroundings, keep bed in low position, maintain call bell within reach at all times, provide assistance with transfer out of bed and ambulation.

## 2014-11-03 ENCOUNTER — Other Ambulatory Visit: Payer: Self-pay | Admitting: Pain Medicine

## 2014-11-05 ENCOUNTER — Ambulatory Visit: Payer: Self-pay | Admitting: Family Medicine

## 2014-11-06 ENCOUNTER — Ambulatory Visit (INDEPENDENT_AMBULATORY_CARE_PROVIDER_SITE_OTHER): Payer: Medicare Other | Admitting: Family Medicine

## 2014-11-06 ENCOUNTER — Encounter: Payer: Self-pay | Admitting: Family Medicine

## 2014-11-06 VITALS — BP 124/62 | HR 70 | Temp 98.6°F | Resp 16 | Ht 66.0 in | Wt 235.2 lb

## 2014-11-06 DIAGNOSIS — L97511 Non-pressure chronic ulcer of other part of right foot limited to breakdown of skin: Secondary | ICD-10-CM | POA: Diagnosis not present

## 2014-11-06 DIAGNOSIS — N3091 Cystitis, unspecified with hematuria: Secondary | ICD-10-CM

## 2014-11-06 DIAGNOSIS — Z1389 Encounter for screening for other disorder: Secondary | ICD-10-CM

## 2014-11-06 DIAGNOSIS — J40 Bronchitis, not specified as acute or chronic: Secondary | ICD-10-CM | POA: Diagnosis not present

## 2014-11-06 LAB — POCT URINALYSIS DIPSTICK
Glucose, UA: NEGATIVE
KETONES UA: NEGATIVE
Nitrite, UA: POSITIVE
PH UA: 6.5
Protein, UA: 300
Spec Grav, UA: 1.01
UROBILINOGEN UA: 0.2

## 2014-11-06 MED ORDER — AMOXICILLIN-POT CLAVULANATE 875-125 MG PO TABS
1.0000 | ORAL_TABLET | Freq: Two times a day (BID) | ORAL | Status: DC
Start: 2014-11-06 — End: 2015-02-24

## 2014-11-06 NOTE — Progress Notes (Signed)
Subjective:     Patient ID: Amy Meyers, female   DOB: February 28, 1961, 54 y.o.   MRN: [id number]  HPI  Chief Complaint  Patient presents with  . Urinary Tract Infection    patient describes foul smell urine, and burning with urination.   . Cough    productive cough with green mucous, patient reports wheezing and shortness of breath x 4 days  . Mass    patient is diabetic and believes that she developed an ulcer or lesion on her right foot since Sunday, lestion/ulcer has popped open and is draining.   States urinary sx have been present for a week. States respiratory sx have been present for 4 days without other cold symptoms. She has cut down on smoking while ill. Reports prior plantar ulcers despite wearing diabetic shoes per her podiatrist. Does not go barefoot and noticed this most recent ulcer 5 days ago after walking on it. Family member shaved it down with return of pus. Reports sugars in the 200's at times since she has been ill.   Review of Systems  Constitutional: Negative for fever.  Respiratory:       Not currently on respiratory medication though will occasionally use a family member's nebulizer       Objective:   Physical Exam  Cardiovascular:  Pulses:      Dorsalis pedis pulses are 2+ on the right side.  Musculoskeletal: She exhibits no edema.   Ears: T.M's intact without inflammation Throat: no tonsillar enlargement or exudate Neck: no cervical adenopathy Lungs: basilar expiratory wheezes Skin: 1 cm wide x 0.5 cm deep right plantar ulcer. No drainage Explored with sterile swab.    Assessment:     1. Cystitis with hematuria  - POCT Urinalysis Dipstick - Urine Culture - amoxicillin-clavulanate (AUGMENTIN) 875-125 MG per tablet; Take 1 tablet by mouth 2 (two) times daily.  Dispense: 20 tablet; Refill: 0  2. Bronchitis  - amoxicillin-clavulanate (AUGMENTIN) 875-125 MG per tablet; Take 1 tablet by mouth 2 (two) times daily.  Dispense: 20 tablet; Refill: 0  3. Plantar  ulcer, right, limited to breakdown of skin  - amoxicillin-clavulanate (AUGMENTIN) 875-125 MG per tablet; Take 1 tablet by mouth 2 (two) times daily.  Dispense: 20 tablet; Refill: 0    Plan:     Further f/u pending urine C & S Salt water soaks with dressing Mucinex and Delsym for cough

## 2014-11-06 NOTE — Patient Instructions (Addendum)
Soak foot in warm salty water for 5-10 minutes daily and apply a dressing/bandaid If ulcer not improving may call for referral back to podiatry Try Mucinex and Delsym for respiratory symptoms We will call with urine culture results The telephone number for the [facility] is [phone]

## 2014-11-12 ENCOUNTER — Encounter: Payer: Self-pay | Admitting: Family Medicine

## 2014-11-26 ENCOUNTER — Ambulatory Visit: Payer: Medicare Other | Attending: Pain Medicine | Admitting: Pain Medicine

## 2014-11-26 ENCOUNTER — Encounter: Payer: Self-pay | Admitting: Pain Medicine

## 2014-11-26 VITALS — BP 133/60 | HR 94 | Temp 97.1°F | Resp 18 | Ht 66.0 in | Wt 230.0 lb

## 2014-11-26 DIAGNOSIS — M47816 Spondylosis without myelopathy or radiculopathy, lumbar region: Secondary | ICD-10-CM

## 2014-11-26 DIAGNOSIS — M79604 Pain in right leg: Secondary | ICD-10-CM | POA: Diagnosis present

## 2014-11-26 DIAGNOSIS — Z96641 Presence of right artificial hip joint: Secondary | ICD-10-CM | POA: Insufficient documentation

## 2014-11-26 DIAGNOSIS — M545 Low back pain: Secondary | ICD-10-CM | POA: Diagnosis present

## 2014-11-26 DIAGNOSIS — M79605 Pain in left leg: Secondary | ICD-10-CM | POA: Diagnosis present

## 2014-11-26 DIAGNOSIS — M169 Osteoarthritis of hip, unspecified: Secondary | ICD-10-CM | POA: Diagnosis not present

## 2014-11-26 DIAGNOSIS — M47817 Spondylosis without myelopathy or radiculopathy, lumbosacral region: Secondary | ICD-10-CM | POA: Diagnosis not present

## 2014-11-26 DIAGNOSIS — M5135 Other intervertebral disc degeneration, thoracolumbar region: Secondary | ICD-10-CM

## 2014-11-26 DIAGNOSIS — E1340 Other specified diabetes mellitus with diabetic neuropathy, unspecified: Secondary | ICD-10-CM | POA: Diagnosis not present

## 2014-11-26 DIAGNOSIS — M533 Sacrococcygeal disorders, not elsewhere classified: Secondary | ICD-10-CM | POA: Diagnosis not present

## 2014-11-26 DIAGNOSIS — M160 Bilateral primary osteoarthritis of hip: Secondary | ICD-10-CM

## 2014-11-26 DIAGNOSIS — M1711 Unilateral primary osteoarthritis, right knee: Secondary | ICD-10-CM | POA: Diagnosis not present

## 2014-11-26 DIAGNOSIS — E1140 Type 2 diabetes mellitus with diabetic neuropathy, unspecified: Secondary | ICD-10-CM | POA: Diagnosis not present

## 2014-11-26 DIAGNOSIS — M5416 Radiculopathy, lumbar region: Secondary | ICD-10-CM | POA: Diagnosis not present

## 2014-11-26 DIAGNOSIS — M5136 Other intervertebral disc degeneration, lumbar region: Secondary | ICD-10-CM | POA: Diagnosis not present

## 2014-11-26 DIAGNOSIS — E0840 Diabetes mellitus due to underlying condition with diabetic neuropathy, unspecified: Secondary | ICD-10-CM

## 2014-11-26 MED ORDER — METHADONE HCL 10 MG PO TABS: ORAL_TABLET | ORAL | Status: DC

## 2014-11-26 MED ORDER — OXYCODONE HCL 5 MG PO TABS: ORAL_TABLET | ORAL | Status: DC

## 2014-11-26 NOTE — Progress Notes (Signed)
   Subjective:    Patient ID: Amy Meyers, female    DOB: 06-Jan-1961, 54 y.o.   MRN: [id number]  HPI patient is 54 year old female who returns to [place] for further evaluation and treatment of pain involving the lower back and lower extremity region especially the region of the right hip and right knee. Patient is status post prior right total hip replacement and is with known degenerative joint disease of the right knee. At the present time patient has concern regarding dental caries and also with has had recent urinary tract infection.  Patient to follow-up with Dr. Venia Minks at this time to discuss referral to [employer] [facility] and to follow-up with Dr. Venia Minks regarding her urinary tract infection. We will continue present medications and remain available consider patient for modification of treatment pending follow-up evaluation patient states that her lumbar lower extremity pain is fairly well-controlled at this time. Patient's predominant concern regarding her dental caries and she is in hopes of being able to have dental suture performed. Some     Review of Systems     Objective:   Physical Exam There was tends to palpation of the splenius capitis and occipitalis region palpation was reproduced pain of mild to moderate degree. There was tenderness over the region cervical facet cervical paraspinal musculature region and the lumbar facet lumbar paraspinal musculature region. Palpation of the cervical facet cervical paraspinal musculature region associated with mild to moderate discomfort. There was mild to moderate tends to palpation over the thoracic facet thoracic paraspinal musculature region. No crepitus of the thoracic region was noted. Tinel and Phalen's maneuver were without increase of pain of significant degree. There was unremarkable Spurling's maneuver. Palpation over the lumbar paraspinal musculature region lumbar facet region associated with moderate discomfort with  lateral bending and rotation and extension and palpation of the lumbar facets reproducing moderate discomfort. Palpation of the PSIS and PII S region reproduces moderate discomfort. Palpation of the greater trochanteric region iliotibial band region reproducing mild discomfort. Straight leg raising was limited to approximately 20 without increased pain with dorsiflexion noted there was negative clonus negative Homans there was tends to palpation of the right knee with negative anterior posterior drawer signs and no ballottement of the patella. Crepitus of the knee was noted. Abdomen nontender no costovertebral tenderness noted       Assessment & Plan:  Degenerative disc disease lumbar spine Multilevel degenerative changes of the lumbar spine with L4-L5 level involvement most significantly  Lumbar facet syndrome  Status post right total hip replacement  Degenerative joint disease right knee  Sacroiliac joint dysfunction  Diabetic neuropathy    Plan   Continue present medications oxycodone and methadone   F/U PCP for Dr. Venia Minks evaliation of  BP and general medical  condition. Patient is to follow-up with Dr. Venia Minks to discuss referral to [employer] [facility] for evaluation and treatment of dental caries and follow-up Dr. Venia Minks for further evaluation of urinary tract infection  F/U surgical evaluation  F/U neurological evaluation  May consider radiofrequency rhizolysis or intraspinal procedures pending response to present treatment and F/U evaluation.  Patient to call [facility] should patient have concerns prior to scheduled return appointment.

## 2014-11-26 NOTE — Progress Notes (Signed)
Discharge patient home ambulatory at 1005hrs Script given for oxycodone and methadone; patient to f/u with PCP for antibiotic per Dr Primus Bravo Teach back done

## 2014-11-26 NOTE — Progress Notes (Signed)
Safety precautions to be maintained throughout the outpatient stay will include: orient to surroundings, keep bed in low position, maintain call bell within reach at all times, provide assistance with transfer out of bed and ambulation.  

## 2014-11-26 NOTE — Patient Instructions (Addendum)
Continue present medications oxycodone and methadone. Also call Dr. Venia Minks to discuss continuing antibiotic therapy for your dental condition until you can see the dentist  F/U PCP for evaliation of  BP and general medical  condition.  F/U surgical evaluation  F/U neurological evaluation  May consider radiofrequency rhizolysis or intraspinal procedures pending response to present treatment and F/U evaluation.  Patient to call [facility] should patient have concerns prior to scheduled return appointment.

## 2014-12-29 ENCOUNTER — Encounter: Payer: Self-pay | Admitting: Pain Medicine

## 2014-12-29 ENCOUNTER — Ambulatory Visit: Payer: Medicare Other | Attending: Pain Medicine | Admitting: Pain Medicine

## 2014-12-29 VITALS — BP 121/55 | HR 83 | Temp 98.9°F | Resp 18 | Ht 66.0 in | Wt 228.0 lb

## 2014-12-29 DIAGNOSIS — M47816 Spondylosis without myelopathy or radiculopathy, lumbar region: Secondary | ICD-10-CM | POA: Insufficient documentation

## 2014-12-29 DIAGNOSIS — E1340 Other specified diabetes mellitus with diabetic neuropathy, unspecified: Secondary | ICD-10-CM | POA: Diagnosis not present

## 2014-12-29 DIAGNOSIS — E11621 Type 2 diabetes mellitus with foot ulcer: Secondary | ICD-10-CM | POA: Diagnosis not present

## 2014-12-29 DIAGNOSIS — Z96641 Presence of right artificial hip joint: Secondary | ICD-10-CM | POA: Insufficient documentation

## 2014-12-29 DIAGNOSIS — L97519 Non-pressure chronic ulcer of other part of right foot with unspecified severity: Secondary | ICD-10-CM

## 2014-12-29 DIAGNOSIS — M160 Bilateral primary osteoarthritis of hip: Secondary | ICD-10-CM

## 2014-12-29 DIAGNOSIS — M169 Osteoarthritis of hip, unspecified: Secondary | ICD-10-CM | POA: Diagnosis not present

## 2014-12-29 DIAGNOSIS — E0840 Diabetes mellitus due to underlying condition with diabetic neuropathy, unspecified: Secondary | ICD-10-CM

## 2014-12-29 DIAGNOSIS — M1711 Unilateral primary osteoarthritis, right knee: Secondary | ICD-10-CM | POA: Diagnosis not present

## 2014-12-29 DIAGNOSIS — M5136 Other intervertebral disc degeneration, lumbar region: Secondary | ICD-10-CM | POA: Insufficient documentation

## 2014-12-29 DIAGNOSIS — E1140 Type 2 diabetes mellitus with diabetic neuropathy, unspecified: Secondary | ICD-10-CM | POA: Diagnosis not present

## 2014-12-29 DIAGNOSIS — M533 Sacrococcygeal disorders, not elsewhere classified: Secondary | ICD-10-CM | POA: Diagnosis not present

## 2014-12-29 DIAGNOSIS — M47817 Spondylosis without myelopathy or radiculopathy, lumbosacral region: Secondary | ICD-10-CM | POA: Diagnosis not present

## 2014-12-29 DIAGNOSIS — M5416 Radiculopathy, lumbar region: Secondary | ICD-10-CM | POA: Diagnosis not present

## 2014-12-29 DIAGNOSIS — M5135 Other intervertebral disc degeneration, thoracolumbar region: Secondary | ICD-10-CM

## 2014-12-29 DIAGNOSIS — M545 Low back pain: Secondary | ICD-10-CM | POA: Diagnosis present

## 2014-12-29 MED ORDER — OXYCODONE HCL 5 MG PO TABS: ORAL_TABLET | ORAL | Status: DC

## 2014-12-29 MED ORDER — METHADONE HCL 10 MG PO TABS: ORAL_TABLET | ORAL | Status: DC

## 2014-12-29 NOTE — Progress Notes (Signed)
   Subjective:    Patient ID: Amy Meyers, female    DOB: [birth year], 54 y.o.   MRN: [id number]  HPI  Patient is 54 year old female returns to [place] for further evaluation and treatment of pain involving the lower back lower extremity region. Right total hip replacement on the right and is with pain fairly well-controlled. Patient is quite concerned about the development of ulcer of the right foot . The patient  has diabetes mellitus and is quite concerned about the lesion of the right foot. Patient wishes to proceed with interventional treatment at time return appointment in hopes of preventing the need to consider amputation or other treatment we will proceed with lumbar sympathetic block at time of return appointment in attempt to prevent severity of condition, prevent progression of symptoms, and avoid need for more involved treatment. The patient was understanding and agreement with suggested treatment plan. The patient will undergo follow-up evaluation in the wound care clinic as discussed as well     Review of Systems     Objective:   Physical Exam  There was tenderness to palpation of the splenius capitis and occipitalis musculature regions of minimal degree. There was minimal tenderness of the cervical and thoracic facet and paraspinal musculature regions. There was mild tenderness of the acromioclavicular glenohumeral joint region. There was unremarkable Spurling's maneuver. Tinel and Phalen's maneuver were without increase of pain of any significant degree. Palpation over the lumbar facet lumbar paraspinal musculature region was with tenderness to palpation of mild to moderate degree with lateral bending and rotation reproducing moderate discomfort. There was moderate tenderness of the greater trochanteric region iliotibial band region straight leg raising was decreased on the right compared to the left there was straight leg raising tolerates approximately 20 without  increase of pain with dorsiflexion noted. There was negative clonus negative Homans. There was lesion of the dorsal aspect of the right foot without evidence of drainage at the time there was tenderness to palpation of the right lower extremity with negative Homans and negative clonus there was decreased sensation of the lower extremities in a stocking type distribution. Abdomen was nontender and no costovertebral angle tenderness was noted      Assessment & Plan:   Diabetes mellitus with diabetic neuropathy  Diabetic foot ulcer  Degenerative disc disease lumbar spine Multilevel degenerative changes of the lumbar spine with L4-L5 level involvement most significantly  Lumbar facet syndrome  Status post right total hip replacement  Degenerative joint disease right knee  Sacroiliac joint dysfunction   Plan     Continue present medications oxycodone and methadone   Lumbar sympathetic block to be performed Monday, 01/04/2015  F/U PCP for Dr. Venia Minks evaliation of  BP and general medical  condition. Patient is to follow-up with Dr. Venia Minks to discuss further treatment of diabetic foot ulcer with consideration for wound care clinic evaluation  Wound care clinic evaluation as discussed with patient  F/U surgical evaluation  F/U neurological evaluation  May consider radiofrequency rhizolysis or intraspinal procedures pending response to present treatment and F/U evaluation.  Patient to call [facility] should patient have concerns prior to scheduled return appointment.

## 2014-12-29 NOTE — Patient Instructions (Addendum)
Continue present medications oxycodone and methadone  Lumbar sympathetic block to be performed Wednesday, 01/06/2015  F/U PCP Dr. Venia Minks for evaliation of  BP and general medical  Condition.  F/U wound care clinic evaluation as we discussed  F/U surgical evaluation  F/U neurological evaluation  May consider radiofrequency rhizolysis or intraspinal procedures pending response to present treatment and F/U evaluation.  Patient to call [facility] should patient have concerns prior to scheduled return appointment. GENERAL RISKS AND COMPLICATIONS  What are the risk, side effects and possible complications? Generally speaking, most procedures are safe.  However, with any procedure there are risks, side effects, and the possibility of complications.  The risks and complications are dependent upon the sites that are lesioned, or the type of nerve block to be performed.  The closer the procedure is to the spine, the more serious the risks are.  Great care is taken when placing the radio frequency needles, block needles or lesioning probes, but sometimes complications can occur. 1. Infection: Any time there is an injection through the skin, there is a risk of infection.  This is why sterile conditions are used for these blocks.  There are four possible types of infection. 1. Localized skin infection. 2. Central Nervous System Infection-This can be in the form of Meningitis, which can be deadly. 3. Epidural Infections-This can be in the form of an epidural abscess, which can cause pressure inside of the spine, causing compression of the spinal cord with subsequent paralysis. This would require an emergency surgery to decompress, and there are no guarantees that the patient would recover from the paralysis. 4. Discitis-This is an infection of the intervertebral discs.  It occurs in about 1% of discography procedures.  It is difficult to treat and it may lead to surgery.        2. Pain: the  needles have to go through skin and soft tissues, will cause soreness.       3. Damage to internal structures:  The nerves to be lesioned may be near blood vessels or    other nerves which can be potentially damaged.       4. Bleeding: Bleeding is more common if the patient is taking blood thinners such as  aspirin, Coumadin, Ticiid, Plavix, etc., or if he/she have some genetic predisposition  such as hemophilia. Bleeding into the spinal canal can cause compression of the spinal  cord with subsequent paralysis.  This would require an emergency surgery to  decompress and there are no guarantees that the patient would recover from the  paralysis.       5. Pneumothorax:  Puncturing of a lung is a possibility, every time a needle is introduced in  the area of the chest or upper back.  Pneumothorax refers to free air around the  collapsed lung(s), inside of the thoracic cavity (chest cavity).  Another two possible  complications related to a similar event would include: Hemothorax and Chylothorax.   These are variations of the Pneumothorax, where instead of air around the collapsed  lung(s), you may have blood or chyle, respectively.       6. Spinal headaches: They may occur with any procedures in the area of the spine.       7. Persistent CSF (Cerebro-Spinal Fluid) leakage: This is a rare problem, but may occur  with prolonged intrathecal or epidural catheters either due to the formation of a fistulous  track or a dural tear.  8. Nerve damage: By working so close to the spinal cord, there is always a possibility of  nerve damage, which could be as serious as a permanent spinal cord injury with  paralysis.       9. Death:  Although rare, severe deadly allergic reactions known as "Anaphylactic  reaction" can occur to any of the medications used.      10. Worsening of the symptoms:  We can always make thing worse.  What are the chances of something like this happening? Chances of any of this occuring are  extremely low.  By statistics, you have more of a chance of getting killed in a motor vehicle accident: while driving to the hospital than any of the above occurring .  Nevertheless, you should be aware that they are possibilities.  In general, it is similar to taking a shower.  Everybody knows that you can slip, hit your head and get killed.  Does that mean that you should not shower again?  Nevertheless always keep in mind that statistics do not mean anything if you happen to be on the wrong side of them.  Even if a procedure has a 1 (one) in a 1,000,000 (million) chance of going wrong, it you happen to be that one..Also, keep in mind that by statistics, you have more of a chance of having something go wrong when taking medications.  Who should not have this procedure? If you are on a blood thinning medication (e.g. Coumadin, Plavix, see list of "Blood Thinners"), or if you have an active infection going on, you should not have the procedure.  If you are taking any blood thinners, please inform your physician.  How should I prepare for this procedure?  Do not eat or drink anything at least six hours prior to the procedure.  Bring a driver with you .  It cannot be a taxi.  Come accompanied by an adult that can drive you back, and that is strong enough to help you if your legs get weak or numb from the local anesthetic.  Take all of your medicines the morning of the procedure with just enough water to swallow them.  If you have diabetes, make sure that you are scheduled to have your procedure done first thing in the morning, whenever possible.  If you have diabetes, take only half of your insulin dose and notify our nurse that you have done so as soon as you arrive at the clinic.  If you are diabetic, but only take blood sugar pills (oral hypoglycemic), then do not take them on the morning of your procedure.  You may take them after you have had the procedure.  Do not take aspirin or any  aspirin-containing medications, at least eleven (11) days prior to the procedure.  They may prolong bleeding.  Wear loose fitting clothing that may be easy to take off and that you would not mind if it got stained with Betadine or blood.  Do not wear any jewelry or perfume  Remove any nail coloring.  It will interfere with some of our monitoring equipment.  NOTE: Remember that this is not meant to be interpreted as a complete list of all possible complications.  Unforeseen problems may occur.  BLOOD THINNERS The following drugs contain aspirin or other products, which can cause increased bleeding during surgery and should not be taken for 2 weeks prior to and 1 week after surgery.  If you should need take something for relief of minor  pain, you may take acetaminophen which is found in Tylenol,m Datril, Anacin-3 and Panadol. It is not blood thinner. The products listed below are.  Do not take any of the products listed below in addition to any listed on your instruction sheet.  A.P.C or A.P.C with Codeine Codeine Phosphate Capsules #3 Ibuprofen Ridaura  ABC compound Congesprin Imuran rimadil  Advil Cope Indocin Robaxisal  Alka-Seltzer Effervescent Pain Reliever and Antacid Coricidin or Coricidin-D  Indomethacin Rufen  Alka-Seltzer plus Cold Medicine Cosprin Ketoprofen S-A-C Tablets  Anacin Analgesic Tablets or Capsules Coumadin Korlgesic Salflex  Anacin Extra Strength Analgesic tablets or capsules CP-2 Tablets Lanoril Salicylate  Anaprox Cuprimine Capsules Levenox Salocol  Anexsia-D Dalteparin Magan Salsalate  Anodynos Darvon compound Magnesium Salicylate Sine-off  Ansaid Dasin Capsules Magsal Sodium Salicylate  Anturane Depen Capsules Marnal Soma  APF Arthritis pain formula Dewitt's Pills Measurin Stanback  Argesic Dia-Gesic Meclofenamic Sulfinpyrazone  Arthritis Bayer Timed Release Aspirin Diclofenac Meclomen Sulindac  Arthritis pain formula Anacin Dicumarol Medipren Supac  Analgesic  (Safety coated) Arthralgen Diffunasal Mefanamic Suprofen  Arthritis Strength Bufferin Dihydrocodeine Mepro Compound Suprol  Arthropan liquid Dopirydamole Methcarbomol with Aspirin Synalgos  ASA tablets/Enseals Disalcid Micrainin Tagament  Ascriptin Doan's Midol Talwin  Ascriptin A/D Dolene Mobidin Tanderil  Ascriptin Extra Strength Dolobid Moblgesic Ticlid  Ascriptin with Codeine Doloprin or Doloprin with Codeine Momentum Tolectin  Asperbuf Duoprin Mono-gesic Trendar  Aspergum Duradyne Motrin or Motrin IB Triminicin  Aspirin plain, buffered or enteric coated Durasal Myochrisine Trigesic  Aspirin Suppositories Easprin Nalfon Trillsate  Aspirin with Codeine Ecotrin Regular or Extra Strength Naprosyn Uracel  Atromid-S Efficin Naproxen Ursinus  Auranofin Capsules Elmiron Neocylate Vanquish  Axotal Emagrin Norgesic Verin  Azathioprine Empirin or Empirin with Codeine Normiflo Vitamin E  Azolid Emprazil Nuprin Voltaren  Bayer Aspirin plain, buffered or children's or timed BC Tablets or powders Encaprin Orgaran Warfarin Sodium  Buff-a-Comp Enoxaparin Orudis Zorpin  Buff-a-Comp with Codeine Equegesic Os-Cal-Gesic   Buffaprin Excedrin plain, buffered or Extra Strength Oxalid   Bufferin Arthritis Strength Feldene Oxphenbutazone   Bufferin plain or Extra Strength Feldene Capsules Oxycodone with Aspirin   Bufferin with Codeine Fenoprofen Fenoprofen Pabalate or Pabalate-SF   Buffets II Flogesic Panagesic   Buffinol plain or Extra Strength Florinal or Florinal with Codeine Panwarfarin   Buf-Tabs Flurbiprofen Penicillamine   Butalbital Compound Four-way cold tablets Penicillin   Butazolidin Fragmin Pepto-Bismol   Carbenicillin Geminisyn Percodan   Carna Arthritis Reliever Geopen Persantine   Carprofen Gold's salt Persistin   Chloramphenicol Goody's Phenylbutazone   Chloromycetin Haltrain Piroxlcam   Clmetidine heparin Plaquenil   Cllnoril Hyco-pap Ponstel   Clofibrate Hydroxy chloroquine  Propoxyphen         Before stopping any of these medications, be sure to consult the physician who ordered them.  Some, such as Coumadin (Warfarin) are ordered to prevent or treat serious conditions such as "deep thrombosis", "pumonary embolisms", and other heart problems.  The amount of time that you may need off of the medication may also vary with the medication and the reason for which you were taking it.  If you are taking any of these medications, please make sure you notify your pain physician before you undergo any procedures.         Lumbar Sympathetic Block Patient Information  Description: The lumbar plexus is a group of nerves that are part of the sympathetic nervous system.  These nerves supply organs in the pelvis and legs.  Lumbar sympathetic blocks are utilized for the diagnosis and treatment  of painful conditions in these areas.   The lumbar plexus is located on both sides of the aorta at approximately the level of the second lumbar vertebral body.  The block will be performed with you lying on your abdomen with a pillow underneath.  Using direct x-ray guidance,   The plexus will be located on both sides of the spine.  Numbing medicine will be used to deaden the skin prior to needle insertion.  In most cases, a small amount of sedation can be give by IV prior to the numbing medicine.  One or two small needles will be placed near the plexus and local anesthetic will be injected.  This may make your leg(s) feel warm.  The Entire block usually lasts about 15-25 minutes.  Conditions which may be treated by lumbar sympathetic block:   Reflex sympathetic dystrophy  Phantom limb pain  Peripheral neuropathy  Peripheral vascular disease ( inadequate blood flow )  Cancer pain of pelvis, leg and kidney  Preparation for the injection:  1. Do note eat any solid food or diary products within 6 hours of your appointment. 2. You may drink clear liquids up to 2 hours before  appointment.  Clear liquids include water, black coffee, juice or soda.  No milk or cream please. 3. You may take your regular medication, including pain medications, with a sip of water before you appointment.  Diabetics should hold regular insulin ( if taken separately ) and take 1/2 NPH dose the morning of the procedure .  Carry some sugar containing items with you to your appointment. 4. A driver must accompany you and be prepared to drive you home after your procedure. 5. Bring all your current medication with you. 6. An IV may be inserted and sedation may be given at the discretion of the physician.  7. A blood pressure cuff, EKG and other monitors will often be applied during the procedure.  Some patients may need to have extra oxygen administered for a short period. 8. You will be asked to provide medical information, including your allergies and medications, prior to the procedure.  We must know immediately if your taking blood thinners (like Coumadin/Warfarin) or if you are allergic to IV iodine contrast (dye).  We must know if you could possibly be pregnant.  Possible side-effects   Bleeding from needle site or deeper  Infection (rare, can require surgery)  Nerve injury (rare)  Numbness & tingling (temporary)  Collapsed lung (rare)  Spinal headache (a headache worse with upright posture)  Light-headedness (temporary)  Pain at injection site (several days)  Decreased blood pressure (temporary)  Weakness in legs (temporary)  Seizure or other drug reaction (rare)  Call if you experience:   Fever/chills associated with headache or increased back/ neck pain  Headache worsened by an upright position  New onset weakness or numbness of an extremity below the injection site  Hives or difficulty breathing ( go to the emergency room)  Inflammation or drainage at the injections site(s)  New symptoms which are concerning to you  Please note:  If effective, we will  often do a series of 2-3 injections spaced 3-6 weeks apart to maximally decrease your pain.  If initial series is effective, you may be a candidate for a more permanent block of the lumbar sympathetic plexus.  If you have any questions please call [phone] [facility]

## 2014-12-29 NOTE — Progress Notes (Signed)
Safety precautions to be maintained throughout the outpatient stay will include: orient to surroundings, keep bed in low position, maintain call bell within reach at all times, provide assistance with transfer out of bed and ambulation.  

## 2015-01-06 ENCOUNTER — Ambulatory Visit: Payer: Medicare Other | Attending: Pain Medicine | Admitting: Pain Medicine

## 2015-01-06 ENCOUNTER — Encounter: Payer: Self-pay | Admitting: Pain Medicine

## 2015-01-06 VITALS — BP 124/67 | HR 88 | Temp 98.9°F | Resp 16 | Ht 66.0 in | Wt 220.0 lb

## 2015-01-06 DIAGNOSIS — E0840 Diabetes mellitus due to underlying condition with diabetic neuropathy, unspecified: Secondary | ICD-10-CM

## 2015-01-06 DIAGNOSIS — M5135 Other intervertebral disc degeneration, thoracolumbar region: Secondary | ICD-10-CM

## 2015-01-06 DIAGNOSIS — M47817 Spondylosis without myelopathy or radiculopathy, lumbosacral region: Secondary | ICD-10-CM | POA: Diagnosis not present

## 2015-01-06 DIAGNOSIS — E13621 Other specified diabetes mellitus with foot ulcer: Secondary | ICD-10-CM | POA: Diagnosis not present

## 2015-01-06 DIAGNOSIS — M79604 Pain in right leg: Secondary | ICD-10-CM | POA: Diagnosis not present

## 2015-01-06 DIAGNOSIS — L97519 Non-pressure chronic ulcer of other part of right foot with unspecified severity: Secondary | ICD-10-CM

## 2015-01-06 DIAGNOSIS — M1611 Unilateral primary osteoarthritis, right hip: Secondary | ICD-10-CM

## 2015-01-06 DIAGNOSIS — M79605 Pain in left leg: Secondary | ICD-10-CM | POA: Insufficient documentation

## 2015-01-06 DIAGNOSIS — M47816 Spondylosis without myelopathy or radiculopathy, lumbar region: Secondary | ICD-10-CM

## 2015-01-06 DIAGNOSIS — M533 Sacrococcygeal disorders, not elsewhere classified: Secondary | ICD-10-CM

## 2015-01-06 DIAGNOSIS — E11621 Type 2 diabetes mellitus with foot ulcer: Secondary | ICD-10-CM | POA: Insufficient documentation

## 2015-01-06 MED ORDER — CEFAZOLIN SODIUM 1 G IJ SOLR
INTRAMUSCULAR | Status: AC
Start: 2015-01-06 — End: 2015-01-06
  Administered 2015-01-06: 1 g via INTRAVENOUS
  Filled 2015-01-06: qty 10

## 2015-01-06 MED ORDER — FENTANYL CITRATE (PF) 100 MCG/2ML IJ SOLN
INTRAMUSCULAR | Status: AC
  Administered 2015-01-06: 50 ug via INTRAVENOUS
  Filled 2015-01-06: qty 2

## 2015-01-06 MED ORDER — CEFUROXIME AXETIL 250 MG PO TABS: 250.0000 mg | ORAL_TABLET | Freq: Two times a day (BID) | ORAL | Status: DC

## 2015-01-06 MED ORDER — TRIAMCINOLONE ACETONIDE 40 MG/ML IJ SUSP
INTRAMUSCULAR | Status: AC
  Filled 2015-01-06: qty 1

## 2015-01-06 MED ORDER — BUPIVACAINE HCL (PF) 0.25 % IJ SOLN
INTRAMUSCULAR | Status: AC
  Administered 2015-01-06: 30 mL
  Filled 2015-01-06: qty 30

## 2015-01-06 MED ORDER — MIDAZOLAM HCL 5 MG/5ML IJ SOLN
INTRAMUSCULAR | Status: AC
  Administered 2015-01-06: 2 mg via INTRAVENOUS
  Filled 2015-01-06: qty 5

## 2015-01-06 MED ORDER — ORPHENADRINE CITRATE 30 MG/ML IJ SOLN
INTRAMUSCULAR | Status: AC
  Filled 2015-01-06: qty 2

## 2015-01-06 NOTE — Patient Instructions (Addendum)
Continue present medications and begin antibiotic. Please obtain your antibiotic Ceftin today and begin taking antibiotic today  Lumbar sympathetic block to be performed at time of return appointment next Wednesday if approved by your insurance.  F/U PCP  Dr. Venia Minks for evaliation of  BP diabetes mellitus and ulcer of foot and general medical  condition.  F/U surgical evaluation as discussed  F/U wound care clinic as discussed  F/U neurological evaluation.  May consider radiofrequency rhizolysis or intraspinal procedures pending response to present treatment and F/U evaluation.  Patient to call [facility] should patient have concerns prior to scheduled return appointment.  Pain Management Discharge Instructions  General Discharge Instructions :  If you need to reach your doctor call: Monday-Friday 8:00 am - 4:00 pm at [phone] or toll free [phone].  After clinic hours [phone] to have operator reach doctor.  Bring all of your medication bottles to all your appointments in the pain clinic.  To cancel or reschedule your appointment with Pain Management please remember to call 24 hours in advance to avoid a fee.  Refer to the educational materials which you have been given on: General Risks, I had my Procedure. Discharge Instructions, Post Sedation.  Post Procedure Instructions:  The drugs you were given will stay in your system until tomorrow, so for the next 24 hours you should not drive, make any legal decisions or drink any alcoholic beverages.  You may eat anything you prefer, but it is better to start with liquids then soups and crackers, and gradually work up to solid foods.  Please notify your doctor immediately if you have any unusual bleeding, trouble breathing or pain that is not related to your normal pain.  Depending on the type of procedure that was done, some parts of your body may feel week and/or numb.  This usually clears up by tonight or  the next day.  Walk with the use of an assistive device or accompanied by an adult for the 24 hours.  You may use ice on the affected area for the first 24 hours.  Put ice in a Ziploc bag and cover with a towel and place against area 15 minutes on 15 minutes off.  You may switch to heat after 24 hours.GENERAL RISKS AND COMPLICATIONS  What are the risk, side effects and possible complications? Generally speaking, most procedures are safe.  However, with any procedure there are risks, side effects, and the possibility of complications.  The risks and complications are dependent upon the sites that are lesioned, or the type of nerve block to be performed.  The closer the procedure is to the spine, the more serious the risks are.  Great care is taken when placing the radio frequency needles, block needles or lesioning probes, but sometimes complications can occur. 1. Infection: Any time there is an injection through the skin, there is a risk of infection.  This is why sterile conditions are used for these blocks.  There are four possible types of infection. 1. Localized skin infection. 2. Central Nervous System Infection-This can be in the form of Meningitis, which can be deadly. 3. Epidural Infections-This can be in the form of an epidural abscess, which can cause pressure inside of the spine, causing compression of the spinal cord with subsequent paralysis. This would require an emergency surgery to decompress, and there are no guarantees that the patient would recover from the paralysis. 4. Discitis-This is an infection of the intervertebral discs.  It occurs in about  1% of discography procedures.  It is difficult to treat and it may lead to surgery.        2. Pain: the needles have to go through skin and soft tissues, will cause soreness.       3. Damage to internal structures:  The nerves to be lesioned may be near blood vessels or    other nerves which can be potentially damaged.        4. Bleeding: Bleeding is more common if the patient is taking blood thinners such as  aspirin, Coumadin, Ticiid, Plavix, etc., or if he/she have some genetic predisposition  such as hemophilia. Bleeding into the spinal canal can cause compression of the spinal  cord with subsequent paralysis.  This would require an emergency surgery to  decompress and there are no guarantees that the patient would recover from the  paralysis.       5. Pneumothorax:  Puncturing of a lung is a possibility, every time a needle is introduced in  the area of the chest or upper back.  Pneumothorax refers to free air around the  collapsed lung(s), inside of the thoracic cavity (chest cavity).  Another two possible  complications related to a similar event would include: Hemothorax and Chylothorax.   These are variations of the Pneumothorax, where instead of air around the collapsed  lung(s), you may have blood or chyle, respectively.       6. Spinal headaches: They may occur with any procedures in the area of the spine.       7. Persistent CSF (Cerebro-Spinal Fluid) leakage: This is a rare problem, but may occur  with prolonged intrathecal or epidural catheters either due to the formation of a fistulous  track or a dural tear.       8. Nerve damage: By working so close to the spinal cord, there is always a possibility of  nerve damage, which could be as serious as a permanent spinal cord injury with  paralysis.       9. Death:  Although rare, severe deadly allergic reactions known as "Anaphylactic  reaction" can occur to any of the medications used.      10. Worsening of the symptoms:  We can always make thing worse.  What are the chances of something like this happening? Chances of any of this occuring are extremely low.  By statistics, you have more of a chance of getting killed in a motor vehicle accident: while driving to the hospital than any of the above occurring .  Nevertheless, you should be aware that they are  possibilities.  In general, it is similar to taking a shower.  Everybody knows that you can slip, hit your head and get killed.  Does that mean that you should not shower again?  Nevertheless always keep in mind that statistics do not mean anything if you happen to be on the wrong side of them.  Even if a procedure has a 1 (one) in a 1,000,000 (million) chance of going wrong, it you happen to be that one..Also, keep in mind that by statistics, you have more of a chance of having something go wrong when taking medications.  Who should not have this procedure? If you are on a blood thinning medication (e.g. Coumadin, Plavix, see list of "Blood Thinners"), or if you have an active infection going on, you should not have the procedure.  If you are taking any blood thinners, please inform your physician.  How should I  prepare for this procedure?  Do not eat or drink anything at least six hours prior to the procedure.  Bring a driver with you .  It cannot be a taxi.  Come accompanied by an adult that can drive you back, and that is strong enough to help you if your legs get weak or numb from the local anesthetic.  Take all of your medicines the morning of the procedure with just enough water to swallow them.  If you have diabetes, make sure that you are scheduled to have your procedure done first thing in the morning, whenever possible.  If you have diabetes, take only half of your insulin dose and notify our nurse that you have done so as soon as you arrive at the clinic.  If you are diabetic, but only take blood sugar pills (oral hypoglycemic), then do not take them on the morning of your procedure.  You may take them after you have had the procedure.  Do not take aspirin or any aspirin-containing medications, at least eleven (11) days prior to the procedure.  They may prolong bleeding.  Wear loose fitting clothing that may be easy to take off and that you would not mind if it got stained with  Betadine or blood.  Do not wear any jewelry or perfume  Remove any nail coloring.  It will interfere with some of our monitoring equipment.  NOTE: Remember that this is not meant to be interpreted as a complete list of all possible complications.  Unforeseen problems may occur.  BLOOD THINNERS The following drugs contain aspirin or other products, which can cause increased bleeding during surgery and should not be taken for 2 weeks prior to and 1 week after surgery.  If you should need take something for relief of minor pain, you may take acetaminophen which is found in Tylenol,m Datril, Anacin-3 and Panadol. It is not blood thinner. The products listed below are.  Do not take any of the products listed below in addition to any listed on your instruction sheet.  A.P.C or A.P.C with Codeine Codeine Phosphate Capsules #3 Ibuprofen Ridaura  ABC compound Congesprin Imuran rimadil  Advil Cope Indocin Robaxisal  Alka-Seltzer Effervescent Pain Reliever and Antacid Coricidin or Coricidin-D  Indomethacin Rufen  Alka-Seltzer plus Cold Medicine Cosprin Ketoprofen S-A-C Tablets  Anacin Analgesic Tablets or Capsules Coumadin Korlgesic Salflex  Anacin Extra Strength Analgesic tablets or capsules CP-2 Tablets Lanoril Salicylate  Anaprox Cuprimine Capsules Levenox Salocol  Anexsia-D Dalteparin Magan Salsalate  Anodynos Darvon compound Magnesium Salicylate Sine-off  Ansaid Dasin Capsules Magsal Sodium Salicylate  Anturane Depen Capsules Marnal Soma  APF Arthritis pain formula Dewitt's Pills Measurin Stanback  Argesic Dia-Gesic Meclofenamic Sulfinpyrazone  Arthritis Bayer Timed Release Aspirin Diclofenac Meclomen Sulindac  Arthritis pain formula Anacin Dicumarol Medipren Supac  Analgesic (Safety coated) Arthralgen Diffunasal Mefanamic Suprofen  Arthritis Strength Bufferin Dihydrocodeine Mepro Compound Suprol  Arthropan liquid Dopirydamole Methcarbomol with Aspirin Synalgos  ASA tablets/Enseals Disalcid  Micrainin Tagament  Ascriptin Doan's Midol Talwin  Ascriptin A/D Dolene Mobidin Tanderil  Ascriptin Extra Strength Dolobid Moblgesic Ticlid  Ascriptin with Codeine Doloprin or Doloprin with Codeine Momentum Tolectin  Asperbuf Duoprin Mono-gesic Trendar  Aspergum Duradyne Motrin or Motrin IB Triminicin  Aspirin plain, buffered or enteric coated Durasal Myochrisine Trigesic  Aspirin Suppositories Easprin Nalfon Trillsate  Aspirin with Codeine Ecotrin Regular or Extra Strength Naprosyn Uracel  Atromid-S Efficin Naproxen Ursinus  Auranofin Capsules Elmiron Neocylate Vanquish  Axotal Emagrin Norgesic Verin  Azathioprine Empirin or Empirin  with Codeine Normiflo Vitamin E  Azolid Emprazil Nuprin Voltaren  Bayer Aspirin plain, buffered or children's or timed BC Tablets or powders Encaprin Orgaran Warfarin Sodium  Buff-a-Comp Enoxaparin Orudis Zorpin  Buff-a-Comp with Codeine Equegesic Os-Cal-Gesic   Buffaprin Excedrin plain, buffered or Extra Strength Oxalid   Bufferin Arthritis Strength Feldene Oxphenbutazone   Bufferin plain or Extra Strength Feldene Capsules Oxycodone with Aspirin   Bufferin with Codeine Fenoprofen Fenoprofen Pabalate or Pabalate-SF   Buffets II Flogesic Panagesic   Buffinol plain or Extra Strength Florinal or Florinal with Codeine Panwarfarin   Buf-Tabs Flurbiprofen Penicillamine   Butalbital Compound Four-way cold tablets Penicillin   Butazolidin Fragmin Pepto-Bismol   Carbenicillin Geminisyn Percodan   Carna Arthritis Reliever Geopen Persantine   Carprofen Gold's salt Persistin   Chloramphenicol Goody's Phenylbutazone   Chloromycetin Haltrain Piroxlcam   Clmetidine heparin Plaquenil   Cllnoril Hyco-pap Ponstel   Clofibrate Hydroxy chloroquine Propoxyphen         Before stopping any of these medications, be sure to consult the physician who ordered them.  Some, such as Coumadin (Warfarin) are ordered to prevent or treat serious conditions such as "deep thrombosis",  "pumonary embolisms", and other heart problems.  The amount of time that you may need off of the medication may also vary with the medication and the reason for which you were taking it.  If you are taking any of these medications, please make sure you notify your pain physician before you undergo any procedures.  Antibiotic to be picked up at pharmacy.

## 2015-01-06 NOTE — Progress Notes (Signed)
Subjective:    Patient ID: Amy Meyers, female    DOB: [birth year], 54 y.o.   MRN: [id number]  HPI   PROCEDURE PERFORMED: Lumbar sympathetic block.  HISTORY OF PRESENT ILLNESS: The patient is 54 y.o. female who returns to [facility] for further evaluation and treatment of pain involving the lower extremities. The patient is with history of diabetes mellitus with diabetic foot ulcer with pain described as burning throbbing shooting pain associated with ulceration of the foot . There is concern regarding the patient's pain being due to diabetic neuropathy with diabetic foot ulcer . The risks, benefits, and expectations of the procedure were discussed and explained to the patient who was understanding and wished to proceed with interventional treatment as planned.   DESCRIPTION OF PROCEDURE: Lumbar sympathetic block with IV Versed, IV fentanyl conscious sedation, EKG, blood pressure, pulse, pulse oximetry monitoring. The procedure was performed with the patient in prone position under fluoroscopic guidance.   NEEDLE PLACEMENT AT L2, right side lumbar sympathetic block: With the patient in prone position and oblique orientation of 20 degrees, Betadine prep and local anesthetic skin wheal of 1.5% lidocaine plain was prepared at the proposed needle entry site. Under fluoroscopic guidance with 20 degrees oblique orientation, the 22 -gauge needle was inserted at the lateral border of the L2 vertebral body on the right side.   NEEDLE PLACEMENT AT L3, right side lumbar sympathetic block: With the patient in the prone position and oblique orientation of 20 degrees, Betadine prep and local anesthetic skin wheal of 1.5% lidocaine plain was prepared at the proposed needle entry site. Under fluoroscopic guidance with 20 degrees  oblique orientation, the 22 -gauge needle was inserted at the lateral border of the L3 vertebral body on the right side.   NEEDLE PLACEMENT AT L4, right side lumbar sympathetic  block: With the patient in prone position and oblique orientation of 20 degrees, Betadine prep and local anesthetic skin wheal of 1.5% lidocaine plain was prepared at the proposed needle entry site. Under fluoroscopic guidance with 20 degrees  oblique orientation, the 22 -gauge needle was inserted at the lateral border of the L4 vertebral body on the right side.    Following needle placement at the L2, L3 and L4 vertebral body levels on the right side, needle placement was then verified on lateral view with tip of the needle documented to be in the anterior third of the vertebral body of L2, L3 and L4 respectively. Following negative aspiration of each needle for heme and CSF, L2 vertebral body level needle was injected with 10 mL of 0.25% bupivacaine. L3 vertebral body level needle was injected with 10 mL of 0.25% bupivacaine. L4 vertebral body level was injected with 10 mL of 0.25% bupivacaine. Needles were removed. Please note temperature readings prior to lumbar sympathetic block were noted to be 91 degrees Fahrenheit and following completion of the lumbar sympathetic block temperature readings of the lower extremity were noted to be 88 degrees Fahrenheit. The patient tolerated the procedure well.  PLAN:   1. Medications: We will continue presently prescribed medication oxycodone at this time. 2. The patient is to follow-up with primary care physician Dr. Margarita Rana for further evaluation of diabetic foot ulcer and diabetes, blood pressure, and general medical condition as discussed. 3. Surgical  and wound care clinic evaluation as discussed. 4. Neurological evaluation as discussed. 5. The patient may be candidate for radiofrequency procedures, implantation devices, and other treatment pending response to treatment and follow-up  evaluation. 6. The patient has been advised to adhere to proper body mechanics. 7. The patient has been advised to call the Pain Management Center prior to scheduled  return appointment should there be significant change in condition or have other concerns regarding condition prior to scheduled return appointment.   The patient was understanding and in agreement with suggested treatment plan.     Review of Systems     Objective:   Physical Exam        Assessment & Plan:

## 2015-01-06 NOTE — Progress Notes (Signed)
   Subjective:    Patient ID: Amy Meyers, female    DOB: 1960-06-11, 54 y.o.   MRN: [id number]  HPI    Review of Systems     Objective:   Physical Exam        Assessment & Plan:

## 2015-01-06 NOTE — Progress Notes (Signed)
Safety precautions to be maintained throughout the outpatient stay will include: orient to surroundings, keep bed in low position, maintain call bell within reach at all times, provide assistance with transfer out of bed and ambulation.  

## 2015-01-07 ENCOUNTER — Telehealth: Payer: Self-pay

## 2015-01-07 NOTE — Telephone Encounter (Signed)
Spoke with patients daughter.  States patient was doing fine last night when she checked on her.  She said Patient does not have phone right now, but would be going to check on her in a little while and would call us if patient was having any issues.

## 2015-01-13 ENCOUNTER — Ambulatory Visit: Payer: Medicare Other | Attending: Pain Medicine | Admitting: Pain Medicine

## 2015-01-13 VITALS — BP 111/56 | HR 77 | Temp 99.0°F | Resp 18 | Ht 66.0 in | Wt 220.0 lb

## 2015-01-13 DIAGNOSIS — M533 Sacrococcygeal disorders, not elsewhere classified: Secondary | ICD-10-CM

## 2015-01-13 DIAGNOSIS — M79605 Pain in left leg: Secondary | ICD-10-CM | POA: Diagnosis present

## 2015-01-13 DIAGNOSIS — L97519 Non-pressure chronic ulcer of other part of right foot with unspecified severity: Secondary | ICD-10-CM

## 2015-01-13 DIAGNOSIS — M5135 Other intervertebral disc degeneration, thoracolumbar region: Secondary | ICD-10-CM

## 2015-01-13 DIAGNOSIS — E1140 Type 2 diabetes mellitus with diabetic neuropathy, unspecified: Secondary | ICD-10-CM | POA: Insufficient documentation

## 2015-01-13 DIAGNOSIS — E0840 Diabetes mellitus due to underlying condition with diabetic neuropathy, unspecified: Secondary | ICD-10-CM

## 2015-01-13 DIAGNOSIS — M47816 Spondylosis without myelopathy or radiculopathy, lumbar region: Secondary | ICD-10-CM

## 2015-01-13 DIAGNOSIS — M79604 Pain in right leg: Secondary | ICD-10-CM | POA: Diagnosis present

## 2015-01-13 DIAGNOSIS — E1369 Other specified diabetes mellitus with other specified complication: Secondary | ICD-10-CM | POA: Diagnosis not present

## 2015-01-13 DIAGNOSIS — M160 Bilateral primary osteoarthritis of hip: Secondary | ICD-10-CM

## 2015-01-13 DIAGNOSIS — M5416 Radiculopathy, lumbar region: Secondary | ICD-10-CM | POA: Diagnosis not present

## 2015-01-13 DIAGNOSIS — E11621 Type 2 diabetes mellitus with foot ulcer: Secondary | ICD-10-CM | POA: Diagnosis not present

## 2015-01-13 MED ORDER — TRIAMCINOLONE ACETONIDE 40 MG/ML IJ SUSP
INTRAMUSCULAR | Status: AC
  Filled 2015-01-13: qty 1

## 2015-01-13 MED ORDER — CEFAZOLIN SODIUM 1 G IJ SOLR
INTRAMUSCULAR | Status: AC
  Administered 2015-01-13: 1 g via INTRAVENOUS
  Filled 2015-01-13: qty 10

## 2015-01-13 MED ORDER — MIDAZOLAM HCL 5 MG/5ML IJ SOLN
INTRAMUSCULAR | Status: AC
  Filled 2015-01-13: qty 5

## 2015-01-13 MED ORDER — CEFUROXIME AXETIL 250 MG PO TABS: 250.0000 mg | ORAL_TABLET | Freq: Two times a day (BID) | ORAL | Status: DC

## 2015-01-13 MED ORDER — ORPHENADRINE CITRATE 30 MG/ML IJ SOLN
INTRAMUSCULAR | Status: AC
  Filled 2015-01-13: qty 2

## 2015-01-13 MED ORDER — BUPIVACAINE HCL (PF) 0.25 % IJ SOLN
INTRAMUSCULAR | Status: AC
  Administered 2015-01-13: 30 mL
  Filled 2015-01-13: qty 30

## 2015-01-13 MED ORDER — FENTANYL CITRATE (PF) 100 MCG/2ML IJ SOLN
INTRAMUSCULAR | Status: AC
  Administered 2015-01-13: 100 ug via INTRAVENOUS
  Filled 2015-01-13: qty 2

## 2015-01-13 NOTE — Patient Instructions (Addendum)
Continue present medications methadone and oxycodone and begin taking antibiotic Ceftin as prescribed. Please obtain your antibiotic today and begin taking antibiotic today  F/U PCP Dr. Venia Minks for evaliation of  BP and general medical  condition.  F/U surgical evaluation as discussed  F/U neurological evaluation.  F/U wound care clinic evaluation  May consider radiofrequency rhizolysis or intraspinal procedures pending response to present treatment and F/U evaluation.  Patient to call [facility] Center should patient have concerns prior to scheduled return appointment.  Pain Management Discharge Instructions  General Discharge Instructions :  If you need to reach your doctor call: Monday-Friday 8:00 am - 4:00 pm at [phone] or toll free [phone].  After clinic hours [phone] to have operator reach doctor.  Bring all of your medication bottles to all your appointments in the pain clinic.  To cancel or reschedule your appointment with Pain Management please remember to call 24 hours in advance to avoid a fee.  Refer to the educational materials which you have been given on: General Risks, I had my Procedure. Discharge Instructions, Post Sedation.  Post Procedure Instructions:  The drugs you were given will stay in your system until tomorrow, so for the next 24 hours you should not drive, make any legal decisions or drink any alcoholic beverages.  You may eat anything you prefer, but it is better to start with liquids then soups and crackers, and gradually work up to solid foods.  Please notify your doctor immediately if you have any unusual bleeding, trouble breathing or pain that is not related to your normal pain.  Depending on the type of procedure that was done, some parts of your body may feel week and/or numb.  This usually clears up by tonight or the next day.  Walk with the use of an assistive device or accompanied by an adult for the 24 hours.  You may  use ice on the affected area for the first 24 hours.  Put ice in a Ziploc bag and cover with a towel and place against area 15 minutes on 15 minutes off.  You may switch to heat after 24 hours.GENERAL RISKS AND COMPLICATIONS  What are the risk, side effects and possible complications? Generally speaking, most procedures are safe.  However, with any procedure there are risks, side effects, and the possibility of complications.  The risks and complications are dependent upon the sites that are lesioned, or the type of nerve block to be performed.  The closer the procedure is to the spine, the more serious the risks are.  Great care is taken when placing the radio frequency needles, block needles or lesioning probes, but sometimes complications can occur. 1. Infection: Any time there is an injection through the skin, there is a risk of infection.  This is why sterile conditions are used for these blocks.  There are four possible types of infection. 1. Localized skin infection. 2. Central Nervous System Infection-This can be in the form of Meningitis, which can be deadly. 3. Epidural Infections-This can be in the form of an epidural abscess, which can cause pressure inside of the spine, causing compression of the spinal cord with subsequent paralysis. This would require an emergency surgery to decompress, and there are no guarantees that the patient would recover from the paralysis. 4. Discitis-This is an infection of the intervertebral discs.  It occurs in about 1% of discography procedures.  It is difficult to treat and it may lead to surgery.  2. Pain: the needles have to go through skin and soft tissues, will cause soreness.       3. Damage to internal structures:  The nerves to be lesioned may be near blood vessels or    other nerves which can be potentially damaged.       4. Bleeding: Bleeding is more common if the patient is taking blood thinners such as  aspirin, Coumadin, Ticiid, Plavix,  etc., or if he/she have some genetic predisposition  such as hemophilia. Bleeding into the spinal canal can cause compression of the spinal  cord with subsequent paralysis.  This would require an emergency surgery to  decompress and there are no guarantees that the patient would recover from the  paralysis.       5. Pneumothorax:  Puncturing of a lung is a possibility, every time a needle is introduced in  the area of the chest or upper back.  Pneumothorax refers to free air around the  collapsed lung(s), inside of the thoracic cavity (chest cavity).  Another two possible  complications related to a similar event would include: Hemothorax and Chylothorax.   These are variations of the Pneumothorax, where instead of air around the collapsed  lung(s), you may have blood or chyle, respectively.       6. Spinal headaches: They may occur with any procedures in the area of the spine.       7. Persistent CSF (Cerebro-Spinal Fluid) leakage: This is a rare problem, but may occur  with prolonged intrathecal or epidural catheters either due to the formation of a fistulous  track or a dural tear.       8. Nerve damage: By working so close to the spinal cord, there is always a possibility of  nerve damage, which could be as serious as a permanent spinal cord injury with  paralysis.       9. Death:  Although rare, severe deadly allergic reactions known as "Anaphylactic  reaction" can occur to any of the medications used.      10. Worsening of the symptoms:  We can always make thing worse.  What are the chances of something like this happening? Chances of any of this occuring are extremely low.  By statistics, you have more of a chance of getting killed in a motor vehicle accident: while driving to the hospital than any of the above occurring .  Nevertheless, you should be aware that they are possibilities.  In general, it is similar to taking a shower.  Everybody knows that you can slip, hit your head and get killed.   Does that mean that you should not shower again?  Nevertheless always keep in mind that statistics do not mean anything if you happen to be on the wrong side of them.  Even if a procedure has a 1 (one) in a 1,000,000 (million) chance of going wrong, it you happen to be that one..Also, keep in mind that by statistics, you have more of a chance of having something go wrong when taking medications.  Who should not have this procedure? If you are on a blood thinning medication (e.g. Coumadin, Plavix, see list of "Blood Thinners"), or if you have an active infection going on, you should not have the procedure.  If you are taking any blood thinners, please inform your physician.  How should I prepare for this procedure?  Do not eat or drink anything at least six hours prior to the procedure.  Bring a driver  with you .  It cannot be a taxi.  Come accompanied by an adult that can drive you back, and that is strong enough to help you if your legs get weak or numb from the local anesthetic.  Take all of your medicines the morning of the procedure with just enough water to swallow them.  If you have diabetes, make sure that you are scheduled to have your procedure done first thing in the morning, whenever possible.  If you have diabetes, take only half of your insulin dose and notify our nurse that you have done so as soon as you arrive at the clinic.  If you are diabetic, but only take blood sugar pills (oral hypoglycemic), then do not take them on the morning of your procedure.  You may take them after you have had the procedure.  Do not take aspirin or any aspirin-containing medications, at least eleven (11) days prior to the procedure.  They may prolong bleeding.  Wear loose fitting clothing that may be easy to take off and that you would not mind if it got stained with Betadine or blood.  Do not wear any jewelry or perfume  Remove any nail coloring.  It will interfere with some of our monitoring  equipment.  NOTE: Remember that this is not meant to be interpreted as a complete list of all possible complications.  Unforeseen problems may occur.  BLOOD THINNERS The following drugs contain aspirin or other products, which can cause increased bleeding during surgery and should not be taken for 2 weeks prior to and 1 week after surgery.  If you should need take something for relief of minor pain, you may take acetaminophen which is found in Tylenol,m Datril, Anacin-3 and Panadol. It is not blood thinner. The products listed below are.  Do not take any of the products listed below in addition to any listed on your instruction sheet.  A.P.C or A.P.C with Codeine Codeine Phosphate Capsules #3 Ibuprofen Ridaura  ABC compound Congesprin Imuran rimadil  Advil Cope Indocin Robaxisal  Alka-Seltzer Effervescent Pain Reliever and Antacid Coricidin or Coricidin-D  Indomethacin Rufen  Alka-Seltzer plus Cold Medicine Cosprin Ketoprofen S-A-C Tablets  Anacin Analgesic Tablets or Capsules Coumadin Korlgesic Salflex  Anacin Extra Strength Analgesic tablets or capsules CP-2 Tablets Lanoril Salicylate  Anaprox Cuprimine Capsules Levenox Salocol  Anexsia-D Dalteparin Magan Salsalate  Anodynos Darvon compound Magnesium Salicylate Sine-off  Ansaid Dasin Capsules Magsal Sodium Salicylate  Anturane Depen Capsules Marnal Soma  APF Arthritis pain formula Dewitt's Pills Measurin Stanback  Argesic Dia-Gesic Meclofenamic Sulfinpyrazone  Arthritis Bayer Timed Release Aspirin Diclofenac Meclomen Sulindac  Arthritis pain formula Anacin Dicumarol Medipren Supac  Analgesic (Safety coated) Arthralgen Diffunasal Mefanamic Suprofen  Arthritis Strength Bufferin Dihydrocodeine Mepro Compound Suprol  Arthropan liquid Dopirydamole Methcarbomol with Aspirin Synalgos  ASA tablets/Enseals Disalcid Micrainin Tagament  Ascriptin Doan's Midol Talwin  Ascriptin A/D Dolene Mobidin Tanderil  Ascriptin Extra Strength Dolobid  Moblgesic Ticlid  Ascriptin with Codeine Doloprin or Doloprin with Codeine Momentum Tolectin  Asperbuf Duoprin Mono-gesic Trendar  Aspergum Duradyne Motrin or Motrin IB Triminicin  Aspirin plain, buffered or enteric coated Durasal Myochrisine Trigesic  Aspirin Suppositories Easprin Nalfon Trillsate  Aspirin with Codeine Ecotrin Regular or Extra Strength Naprosyn Uracel  Atromid-S Efficin Naproxen Ursinus  Auranofin Capsules Elmiron Neocylate Vanquish  Axotal Emagrin Norgesic Verin  Azathioprine Empirin or Empirin with Codeine Normiflo Vitamin E  Azolid Emprazil Nuprin Voltaren  Bayer Aspirin plain, buffered or children's or timed BC Tablets or powders  Encaprin Orgaran Warfarin Sodium  Buff-a-Comp Enoxaparin Orudis Zorpin  Buff-a-Comp with Codeine Equegesic Os-Cal-Gesic   Buffaprin Excedrin plain, buffered or Extra Strength Oxalid   Bufferin Arthritis Strength Feldene Oxphenbutazone   Bufferin plain or Extra Strength Feldene Capsules Oxycodone with Aspirin   Bufferin with Codeine Fenoprofen Fenoprofen Pabalate or Pabalate-SF   Buffets II Flogesic Panagesic   Buffinol plain or Extra Strength Florinal or Florinal with Codeine Panwarfarin   Buf-Tabs Flurbiprofen Penicillamine   Butalbital Compound Four-way cold tablets Penicillin   Butazolidin Fragmin Pepto-Bismol   Carbenicillin Geminisyn Percodan   Carna Arthritis Reliever Geopen Persantine   Carprofen Gold's salt Persistin   Chloramphenicol Goody's Phenylbutazone   Chloromycetin Haltrain Piroxlcam   Clmetidine heparin Plaquenil   Cllnoril Hyco-pap Ponstel   Clofibrate Hydroxy chloroquine Propoxyphen         Before stopping any of these medications, be sure to consult the physician who ordered them.  Some, such as Coumadin (Warfarin) are ordered to prevent or treat serious conditions such as "deep thrombosis", "pumonary embolisms", and other heart problems.  The amount of time that you may need off of the medication may also vary with  the medication and the reason for which you were taking it.  If you are taking any of these medications, please make sure you notify your pain physician before you undergo any procedures.

## 2015-01-13 NOTE — Progress Notes (Signed)
Subjective:    Patient ID: Amy Meyers, female    DOB: July 31, 1960, 54 y.o.   MRN: [id number]  HPI  PROCEDURE PERFORMED: Lumbar sympathetic block.  HISTORY OF PRESENT ILLNESS: The patient is 54 y.o. female who returns to [place] for further evaluation and treatment of pain involving the lower extremities. The patient is with history of diabetes mellitus with diabetic neuropathy vasculopathy and with  ulceration of the right foot with severe lancinating pain. There is concern regarding the patient's pain and ulceration of the foot being due to diabetic neuropathy/vasculopathy . The risks, benefits, and expectations of the procedure were discussed and explained to the patient who was understanding and wished to proceed with interventional treatment as planned.   DESCRIPTION OF PROCEDURE: Lumbar sympathetic block with IV Versed, IV fentanyl conscious sedation, EKG, blood pressure, pulse, pulse oximetry monitoring. The procedure was performed with the patient in prone position under fluoroscopic guidance.   NEEDLE PLACEMENT AT L2, right side lumbar sympathetic block: With the patient in prone position and oblique orientation of 20 degrees, Betadine prep and local anesthetic skin wheal of 1.5% lidocaine plain was prepared at the proposed needle entry site. Under fluoroscopic guidance with 20 degrees oblique orientation, the 22 -gauge needle was inserted at the lateral border of the L2 vertebral body on the right side.   NEEDLE PLACEMENT AT L3, right side lumbar sympathetic block: With the patient in the prone position and oblique orientation of 20 degrees, Betadine prep and local anesthetic skin wheal of 1.5% lidocaine plain was prepared at the proposed needle entry site. Under fluoroscopic guidance with 20 degrees  oblique orientation, the 22 -gauge needle was inserted at the lateral border of the L3 vertebral body on the right side.   NEEDLE PLACEMENT AT L4, right side lumbar sympathetic  block: With the patient in prone position and oblique orientation of 20 degrees, Betadine prep and local anesthetic skin wheal of 1.5% lidocaine plain was prepared at the proposed needle entry site. Under fluoroscopic guidance with 20 degrees  oblique orientation, the 22 -gauge needle was inserted at the lateral border of the L4 vertebral body on the right side.    Following needle placement at the L2, L3 and L4 vertebral body levels on the right side, needle placement was then verified on lateral view with tip of the needle documented to be in the anterior third of the vertebral body of L2, L3 and L4 respectively. Following negative aspiration of each needle for heme and CSF, L2 vertebral body level needle was injected with 10 mL of 0.25% bupivacaine. L3 vertebral body level needle was injected with 10 mL of 0.25% bupivacaine. L4 vertebral body level was injected with 10 mL of 0.25% bupivacaine. Needles were removed. Please note temperature readings prior to lumbar sympathetic block were noted to be 89 degrees Fahrenheit and following completion of the lumbar sympathetic block temperature readings of the lower extremity were noted to be 90 degrees Fahrenheit. The patient tolerated the procedure well.  PLAN:   1. Medications: We will continue presently prescribed medications Ceftin oxycodone and methadone at this time. 2. The patient is to follow-up with primary care physician Dr. Margarita Rana for further evaluation of blood pressure and general medical condition as discussed. 3. Surgical evaluation as discussed. 4. Wound care clinic evaluation as discussed       Neurological evaluation as discussed. 5. The patient may be candidate for radiofrequency procedures, implantation devices, and other treatment pending response to treatment  and follow-up evaluation. 6. The patient has been advised to adhere to proper body mechanics. 7. The patient has been advised to call the Pain Management Center prior to  scheduled return appointment should there be significant change in condition or have other concerns regarding condition prior to scheduled return appointment.   The patient was understanding and in agreement with suggested treatment plan.     Review of Systems     Objective:   Physical Exam        Assessment & Plan:

## 2015-01-13 NOTE — Progress Notes (Signed)
Safety precautions to be maintained throughout the outpatient stay will include: orient to surroundings, keep bed in low position, maintain call bell within reach at all times, provide assistance with transfer out of bed and ambulation.  

## 2015-01-14 ENCOUNTER — Telehealth: Payer: Self-pay | Admitting: *Deleted

## 2015-01-14 NOTE — Telephone Encounter (Signed)
Spoke with patient's daughter, no problems.

## 2015-01-28 ENCOUNTER — Ambulatory Visit: Payer: Medicare Other | Attending: Pain Medicine | Admitting: Pain Medicine

## 2015-01-28 ENCOUNTER — Encounter: Payer: Self-pay | Admitting: Pain Medicine

## 2015-01-28 VITALS — BP 129/76 | HR 79 | Temp 98.5°F | Resp 18 | Ht 66.0 in | Wt 220.0 lb

## 2015-01-28 DIAGNOSIS — M545 Low back pain: Secondary | ICD-10-CM | POA: Diagnosis present

## 2015-01-28 DIAGNOSIS — M461 Sacroiliitis, not elsewhere classified: Secondary | ICD-10-CM | POA: Diagnosis not present

## 2015-01-28 DIAGNOSIS — M47816 Spondylosis without myelopathy or radiculopathy, lumbar region: Secondary | ICD-10-CM | POA: Diagnosis not present

## 2015-01-28 DIAGNOSIS — Z96641 Presence of right artificial hip joint: Secondary | ICD-10-CM | POA: Diagnosis not present

## 2015-01-28 DIAGNOSIS — M1711 Unilateral primary osteoarthritis, right knee: Secondary | ICD-10-CM | POA: Diagnosis not present

## 2015-01-28 DIAGNOSIS — M79605 Pain in left leg: Secondary | ICD-10-CM | POA: Diagnosis present

## 2015-01-28 DIAGNOSIS — E1140 Type 2 diabetes mellitus with diabetic neuropathy, unspecified: Secondary | ICD-10-CM | POA: Insufficient documentation

## 2015-01-28 DIAGNOSIS — M5136 Other intervertebral disc degeneration, lumbar region: Secondary | ICD-10-CM | POA: Insufficient documentation

## 2015-01-28 DIAGNOSIS — M47817 Spondylosis without myelopathy or radiculopathy, lumbosacral region: Secondary | ICD-10-CM | POA: Diagnosis not present

## 2015-01-28 DIAGNOSIS — L97519 Non-pressure chronic ulcer of other part of right foot with unspecified severity: Secondary | ICD-10-CM

## 2015-01-28 DIAGNOSIS — E0840 Diabetes mellitus due to underlying condition with diabetic neuropathy, unspecified: Secondary | ICD-10-CM

## 2015-01-28 DIAGNOSIS — M5135 Other intervertebral disc degeneration, thoracolumbar region: Secondary | ICD-10-CM

## 2015-01-28 DIAGNOSIS — E11621 Type 2 diabetes mellitus with foot ulcer: Secondary | ICD-10-CM

## 2015-01-28 DIAGNOSIS — M79604 Pain in right leg: Secondary | ICD-10-CM | POA: Diagnosis present

## 2015-01-28 DIAGNOSIS — M533 Sacrococcygeal disorders, not elsewhere classified: Secondary | ICD-10-CM | POA: Insufficient documentation

## 2015-01-28 DIAGNOSIS — M5416 Radiculopathy, lumbar region: Secondary | ICD-10-CM | POA: Diagnosis not present

## 2015-01-28 DIAGNOSIS — M160 Bilateral primary osteoarthritis of hip: Secondary | ICD-10-CM

## 2015-01-28 DIAGNOSIS — M791 Myalgia: Secondary | ICD-10-CM | POA: Diagnosis not present

## 2015-01-28 MED ORDER — METHADONE HCL 10 MG PO TABS: ORAL_TABLET | ORAL | Status: DC

## 2015-01-28 MED ORDER — OXYCODONE HCL 5 MG PO TABS: ORAL_TABLET | ORAL | Status: DC

## 2015-01-28 NOTE — Progress Notes (Signed)
Discharged to home, ambulatory with script in hand for methadone and oxycodone.  Return in 1 month.

## 2015-01-28 NOTE — Progress Notes (Signed)
   Subjective:    Patient ID: Amy Meyers, female    DOB: [birth year], 54 y.o.   MRN: [id number]  HPI Patient is 54 year old female who [facility] for further evaluation and treatment of lower back lower extremity pain. Patient also has had foot ulcer felt to be due to patient's diabetes with diabetic neuropathy and vasculopathy. Patient is status post lumbar synthetic blocks with significant healing of the ulceration of the right foot.. We discussed patient's condition and will continue present medications at this time and patient will undergo follow-up evaluation with Dr. Venia Minks for evaluation of blood pressure diabetes mellitus with ulcer and general medical condition. We will consider additional interventional treatment as well as additional modification of treatment pending follow-up evaluation. Patient's pain of the hip status post total hip replacement as well as pain of the knee with severe significant degenerative joint disease of the hip appears to be fairly well-controlled at this time. We will continue present medications methadone and oxycodone at this time.     Review of Systems     Objective:   Physical Exam  There was mild tenderness of the splenius capitis occipitalis musculature regions. Palpation over the region of the acromioclavicular glenohumeral joint regions reproduced minimal discomfort. Patient appeared to be with bilaterally equal grip strength. Tinel and Phalen's maneuver without increased pain of significant degree. There was tenderness over the thoracic facet thoracic paraspinal musculature muscles region of mild degree. Palpation over the lumbar paraspinal muscular region lumbar facet region was with moderate discomfort. Lateral bending and rotation and extension to palpation of the lumbar facets reproduce moderate discomfort. There was mild tennis over the greater trochanteric region iliotibial band region. Knees with crepitus of the knees with  negative anterior and posterior drawer signs without ballottement of the patella. There was no increased warmth or erythema in the region of the knees. No definite sensory deficit of dermatomal distribution of the lower extremities noted. There appeared to be decreased sensation of the lower extremities in a stocking type distribution. With healing ulceration of the right foot plantar aspect. There was negative clonus negative Homans leg raising tolerates a possible 20 without increased pain dorsiflexion noted. Abdomen was nontender and no costovertebral tenderness was noted.      Assessment & Plan:    Diabetes mellitus with diabetic neuropathy  Diabetic foot ulcer  Degenerative disc disease lumbar spine Multilevel degenerative changes of the lumbar spine with L4-L5 level involvement most significantly  Lumbar facet syndrome  Status post right total hip replacement  Degenerative joint disease right knee  Sacroiliac joint dysfunction   PLAN    Continue present medications oxycodone and methadone  F/U PCP Dr. Venia Minks for evaliation of ulceration of the foot diabetes mellitus BP and general medical  condition  F/U surgical evaluation. May consider pending follow-up evaluations  F/U wound care clinic and vascular evaluation as needed  F/U neurological evaluation. May consider pending follow-up evaluations  May consider radiofrequency rhizolysis or intraspinal procedures pending response to present treatment and F/U evaluation   Patient to call Pain Management Center should patient have concerns prior to scheduled return appointment.

## 2015-01-28 NOTE — Progress Notes (Signed)
Safety precautions to be maintained throughout the outpatient stay will include: orient to surroundings, keep bed in low position, maintain call bell within reach at all times, provide assistance with transfer out of bed and ambulation.  

## 2015-01-28 NOTE — Patient Instructions (Signed)
Continue present medications oxycodone and methadone  F/U PCP Dr. Venia Minks for evaliation of  BP and general medical  condition  F/U surgical evaluation. May consider pending follow-up evaluations  F/U neurological evaluation. May consider pending follow-up evaluations  Wound care clinic evaluation. May consider as we discussed  May consider radiofrequency rhizolysis or intraspinal procedures pending response to present treatment and F/U evaluation   Patient to call Pain Management Center should patient have concerns prior to scheduled return appointment.

## 2015-02-24 ENCOUNTER — Encounter: Payer: Self-pay | Admitting: Pain Medicine

## 2015-02-24 ENCOUNTER — Ambulatory Visit: Payer: Medicare Other | Attending: Pain Medicine | Admitting: Pain Medicine

## 2015-02-24 VITALS — BP 134/68 | HR 85 | Temp 98.2°F | Resp 16 | Ht 66.0 in | Wt 220.0 lb

## 2015-02-24 DIAGNOSIS — M1611 Unilateral primary osteoarthritis, right hip: Secondary | ICD-10-CM

## 2015-02-24 DIAGNOSIS — M791 Myalgia: Secondary | ICD-10-CM | POA: Diagnosis not present

## 2015-02-24 DIAGNOSIS — M5416 Radiculopathy, lumbar region: Secondary | ICD-10-CM | POA: Diagnosis not present

## 2015-02-24 DIAGNOSIS — M461 Sacroiliitis, not elsewhere classified: Secondary | ICD-10-CM | POA: Diagnosis not present

## 2015-02-24 DIAGNOSIS — E1140 Type 2 diabetes mellitus with diabetic neuropathy, unspecified: Secondary | ICD-10-CM | POA: Insufficient documentation

## 2015-02-24 DIAGNOSIS — M47817 Spondylosis without myelopathy or radiculopathy, lumbosacral region: Secondary | ICD-10-CM | POA: Diagnosis not present

## 2015-02-24 DIAGNOSIS — L97519 Non-pressure chronic ulcer of other part of right foot with unspecified severity: Secondary | ICD-10-CM

## 2015-02-24 DIAGNOSIS — M5136 Other intervertebral disc degeneration, lumbar region: Secondary | ICD-10-CM | POA: Insufficient documentation

## 2015-02-24 DIAGNOSIS — E0840 Diabetes mellitus due to underlying condition with diabetic neuropathy, unspecified: Secondary | ICD-10-CM

## 2015-02-24 DIAGNOSIS — M545 Low back pain: Secondary | ICD-10-CM | POA: Diagnosis present

## 2015-02-24 DIAGNOSIS — M179 Osteoarthritis of knee, unspecified: Secondary | ICD-10-CM | POA: Insufficient documentation

## 2015-02-24 DIAGNOSIS — M79605 Pain in left leg: Secondary | ICD-10-CM | POA: Diagnosis present

## 2015-02-24 DIAGNOSIS — M79604 Pain in right leg: Secondary | ICD-10-CM | POA: Diagnosis present

## 2015-02-24 DIAGNOSIS — M160 Bilateral primary osteoarthritis of hip: Secondary | ICD-10-CM

## 2015-02-24 DIAGNOSIS — M47816 Spondylosis without myelopathy or radiculopathy, lumbar region: Secondary | ICD-10-CM | POA: Diagnosis not present

## 2015-02-24 DIAGNOSIS — E11621 Type 2 diabetes mellitus with foot ulcer: Secondary | ICD-10-CM | POA: Insufficient documentation

## 2015-02-24 DIAGNOSIS — M533 Sacrococcygeal disorders, not elsewhere classified: Secondary | ICD-10-CM | POA: Diagnosis not present

## 2015-02-24 DIAGNOSIS — M5135 Other intervertebral disc degeneration, thoracolumbar region: Secondary | ICD-10-CM

## 2015-02-24 DIAGNOSIS — Z96641 Presence of right artificial hip joint: Secondary | ICD-10-CM | POA: Insufficient documentation

## 2015-02-24 MED ORDER — OXYCODONE HCL 5 MG PO TABS: ORAL_TABLET | ORAL | Status: DC

## 2015-02-24 MED ORDER — METHADONE HCL 10 MG PO TABS: ORAL_TABLET | ORAL | Status: DC

## 2015-02-24 NOTE — Patient Instructions (Addendum)
PLAN   Continue present medication oxycodone and methadone  F/U PCP Dr. Venia Minks for evaliation of  BP and general medical  condition  F/U surgical evaluation. May consider pending follow-up evaluations  F/U neurological evaluation. May consider pending follow-up evaluations  May consider radiofrequency rhizolysis or intraspinal procedures pending response to present treatment and F/U evaluation   Patient to call Pain Management Center should patient have concerns prior to scheduled return appointment.  A prescription for OXYCODONE AND METHADONE was given to you today.

## 2015-02-24 NOTE — Progress Notes (Signed)
Safety precautions to be maintained throughout the outpatient stay will include: orient to surroundings, keep bed in low position, maintain call bell within reach at all times, provide assistance with transfer out of bed and ambulation.  

## 2015-02-24 NOTE — Progress Notes (Signed)
Discharged ambulatory at 8:16 am

## 2015-02-24 NOTE — Progress Notes (Signed)
   Subjective:    Patient ID: Amy Meyers, female    DOB: May 23, 1961, 54 y.o.   MRN: [id number]  HPI  Patient is 54 year old female returns to [facility] for further evaluation and treatment of pain involving the lower back and lower extremity regions. Patient is with prior right total hip replacement and is with known degenerative joint disease of the hip and has had diabetic foot ulcers which have responded well to lumbar sympathetic blocks. Patient states she is doing well at this time we will avoid interventional treatment and will continue medications consisting of oxycodone and methadone. Patient will continue the care of her primary care physician Dr. Venia Minks. And will undergo orthopedic evaluation of hip and knees as discussed and as needed. Patient was in agreement with suggested treatment plan.    Review of Systems     Objective:   Physical Exam  There was tenderness to palpation of the paraspinal musculature region of the cervical region cervical facet region a mild degree. There was mild tinnitus of the splenius capitis and occipitalis musculature regions. Palpation of the thoracic facet thoracic paraspinal musculature region was with mild tends to palpation. Patient appeared to be with bilaterally equal grip strength. Tinel and Phalen's maneuver were without increase of pain of significant degree. There was unremarkable Spurling's maneuver. Palpation over the lower thoracic region was a tends to palpation of mild to moderate degree with evidence of moderate muscle spasms. No crepitus of the thoracic region was noted. Palpation of the lumbar paraspinal musculature region lumbar facet region was associated with mild to moderate tends to palpation. There was tends to palpation of the PSIS and PII S region of moderate degree. There was moderate tends to palpation over the gluteal and piriformis musculature regions. There was mild tends to palpation of the greater trochanteric  region and along the iliotibial band region Palpation over the region of the knee was a tends to palpation in crepitus of the knee with negative anterior and posterior drawer signs without ballottement of the patella. Straight leg raising was tolerated to 30 without increased pain with dorsiflexion noted. There was negative clonus negative Homans. No sensory deficit of dermatomal distribution detected. Abdomen nontender with no costovertebral angle tenderness noted.         Assessment & Plan:    Diabetes mellitus with diabetic neuropathy  Diabetic foot ulcer  Degenerative disc disease lumbar spine Multilevel degenerative changes of the lumbar spine with L4-L5 level involvement most significantly  Lumbar facet syndrome  Status post right total hip replacement  Degenerative joint disease right knee  Sacroiliac joint dysfunction    PLAN   Continue present medication oxycodone and methadone  F/U PCP Dr. Venia Minks for evaliation of  BP and general medical  condition  F/U surgical evaluation. May consider pending follow-up evaluations  F/U neurological evaluation. May consider pending follow-up evaluations  Patient instructed to return to wound care clinic if she notices any breakdown of skin of the feet or other regions and to contact Dr. Venia Minks and pain clinic immediately  May consider radiofrequency rhizolysis or intraspinal procedures pending response to present treatment and F/U evaluation   Patient to call [facility] should patient have concerns prior to scheduled return appointment.

## 2015-03-11 ENCOUNTER — Other Ambulatory Visit: Payer: Self-pay

## 2015-03-11 DIAGNOSIS — Z794 Long term (current) use of insulin: Principal | ICD-10-CM

## 2015-03-11 DIAGNOSIS — E0840 Diabetes mellitus due to underlying condition with diabetic neuropathy, unspecified: Secondary | ICD-10-CM

## 2015-03-11 MED ORDER — INSULIN GLARGINE 100 UNIT/ML ~~LOC~~ SOLN: 100.0000 [IU] | Freq: Two times a day (BID) | SUBCUTANEOUS | Status: DC

## 2015-03-17 ENCOUNTER — Telehealth: Payer: Self-pay | Admitting: Family Medicine

## 2015-03-17 ENCOUNTER — Encounter: Payer: Self-pay | Admitting: Family Medicine

## 2015-03-17 ENCOUNTER — Ambulatory Visit (INDEPENDENT_AMBULATORY_CARE_PROVIDER_SITE_OTHER): Payer: Medicare Other | Admitting: Family Medicine

## 2015-03-17 VITALS — BP 140/60 | HR 87 | Temp 99.0°F | Resp 16 | Ht 66.0 in | Wt 233.0 lb

## 2015-03-17 DIAGNOSIS — L97519 Non-pressure chronic ulcer of other part of right foot with unspecified severity: Secondary | ICD-10-CM | POA: Diagnosis not present

## 2015-03-17 DIAGNOSIS — J811 Chronic pulmonary edema: Secondary | ICD-10-CM | POA: Insufficient documentation

## 2015-03-17 DIAGNOSIS — E11621 Type 2 diabetes mellitus with foot ulcer: Secondary | ICD-10-CM

## 2015-03-17 DIAGNOSIS — R002 Palpitations: Secondary | ICD-10-CM | POA: Insufficient documentation

## 2015-03-17 DIAGNOSIS — I10 Essential (primary) hypertension: Secondary | ICD-10-CM | POA: Insufficient documentation

## 2015-03-17 DIAGNOSIS — F17200 Nicotine dependence, unspecified, uncomplicated: Secondary | ICD-10-CM

## 2015-03-17 DIAGNOSIS — E0840 Diabetes mellitus due to underlying condition with diabetic neuropathy, unspecified: Secondary | ICD-10-CM | POA: Diagnosis not present

## 2015-03-17 DIAGNOSIS — R0602 Shortness of breath: Secondary | ICD-10-CM | POA: Insufficient documentation

## 2015-03-17 DIAGNOSIS — Z794 Long term (current) use of insulin: Secondary | ICD-10-CM

## 2015-03-17 DIAGNOSIS — K760 Fatty (change of) liver, not elsewhere classified: Secondary | ICD-10-CM | POA: Insufficient documentation

## 2015-03-17 DIAGNOSIS — R9431 Abnormal electrocardiogram [ECG] [EKG]: Secondary | ICD-10-CM | POA: Insufficient documentation

## 2015-03-17 DIAGNOSIS — G44209 Tension-type headache, unspecified, not intractable: Secondary | ICD-10-CM | POA: Insufficient documentation

## 2015-03-17 DIAGNOSIS — I493 Ventricular premature depolarization: Secondary | ICD-10-CM | POA: Insufficient documentation

## 2015-03-17 MED ORDER — INSULIN GLARGINE 100 UNIT/ML ~~LOC~~ SOLN: 100.0000 [IU] | Freq: Two times a day (BID) | SUBCUTANEOUS | Status: DC

## 2015-03-17 MED ORDER — AMOXICILLIN-POT CLAVULANATE 875-125 MG PO TABS: 1.0000 | ORAL_TABLET | Freq: Two times a day (BID) | ORAL | Status: AC

## 2015-03-17 NOTE — Telephone Encounter (Signed)
Pt daughter, Benjamine Mola called stating pt left foot is swollen and has an open sore with redness around the sore.  Can we work pt in today to be seen?  ZD#[phone]/[id number]

## 2015-03-17 NOTE — Patient Instructions (Signed)
Please stop smoking 

## 2015-03-17 NOTE — Telephone Encounter (Signed)
Amy Meyers states she already made an appointment for 3:45 today.  Thanks,   -Mickel Baas

## 2015-03-17 NOTE — Progress Notes (Signed)
       [name] Female    DOB: [birth year]   54 y.o.   MRN: [id number] Visit Date: 03/17/2015  Today's Provider: Lelon Huh, MD   Chief Complaint  Patient presents with  . Skin Ulcer  . Fever   Subjective:    HPI  Patient has had a ulcer on the bottom right foot for a while. Ulcer is now open and infected. Has been followed off an  Allergies  Allergen Reactions  . Meloxicam Itching, Swelling and Nausea And Vomiting  . Sulfa Antibiotics Other (See Comments)   Previous Medications   ASPIRIN 325 MG TABLET    Take 325 mg by mouth daily.   GARLIC 10 MG CAPS    Take by mouth 2 (two) times daily.   INSULIN ASPART (NOVOLOG) 100 UNIT/ML INJECTION    Inject into the skin. Sliding scale. If blood sugar between 200-399 administer 20 units, 400 and over use 40 units   INSULIN GLARGINE (LANTUS) 100 UNIT/ML INJECTION    Inject 1 mL (100 Units total) into the skin 2 (two) times daily.   LORAZEPAM (ATIVAN) 1 MG TABLET    Take 1 mg by mouth 2 (two) times daily as needed for anxiety.   METHADONE (DOLOPHINE) 10 MG TABLET    Limit 5-7 tabs by mouth daily if tolerated   OMEGA-3 FATTY ACIDS (FISH OIL) 1000 MG CAPS    Take by mouth 2 (two) times daily.   OXYCODONE (OXY IR/ROXICODONE) 5 MG IMMEDIATE RELEASE TABLET    Limit 4 - 6 tabs po / day if tolerated   PROMETHAZINE (PHENERGAN) 25 MG TABLET    Take 25 mg by mouth. 1-2 tabs bid prn    Review of Systems  Constitutional: Positive for fever.  Skin: Positive for wound.       Right foot  Neurological: Positive for dizziness and headaches. Negative for light-headedness.    Social History  Substance Use Topics  . Smoking status: Current Every Day Smoker -- 1.00 packs/day for 44 years    Types: Cigarettes  . Smokeless tobacco: Not on file  . Alcohol Use: No   Objective:   BP 140/60 mmHg  Pulse 87  Temp(Src) 99 F (37.2 C) (Oral)  Resp 16  Ht 5\' 6"  (1.676 m)  Wt 233 lb (105.688 kg)  BMI 37.63 kg/m2  SpO2 95%  Physical  Exam  General appearance: alert, well developed, well nourished, cooperative and in no distress, morbidly obese Lungs: Respirations even and unlabored Psych: Appropriate mood and affect. Neurologic: Mental status: Alert, oriented to person, place, and time, thought content appropriate. Nickel sized blistering lesion plantar aspect of right forefoot with small amount yellow discharge. And about 76mm surrounding erythema.    Assessment & Plan:     1. Diabetic ulcer of right foot associated with type 2 diabetes mellitus ([place]) - Augmentin 875 twice daily for 10 days - Wound culture  2. Compulsive tobacco user syndrome Counseled regarding affect of tobacco use on wound healing and that it is imperative that she stop smoking  3. Diabetes mellitus due to underlying condition with diabetic neuropathy, with long-term current use of insulin (HCC) Needs refill Insulin sent to a different pharmacy.  - insulin glargine (LANTUS) 100 UNIT/ML injection; Inject 1 mL (100 Units total) into the skin 2 (two) times daily.  Dispense: 10 mL; Refill: 0        Lelon Huh, MD  [facility]

## 2015-03-19 LAB — WOUND CULTURE: Organism ID, Bacteria: NONE SEEN

## 2015-03-25 ENCOUNTER — Ambulatory Visit: Payer: Medicare Other | Attending: Pain Medicine | Admitting: Pain Medicine

## 2015-03-25 ENCOUNTER — Encounter: Payer: Self-pay | Admitting: Pain Medicine

## 2015-03-25 VITALS — BP 116/63 | HR 87 | Temp 98.4°F | Resp 16 | Ht 66.0 in | Wt 228.0 lb

## 2015-03-25 DIAGNOSIS — Z96641 Presence of right artificial hip joint: Secondary | ICD-10-CM | POA: Insufficient documentation

## 2015-03-25 DIAGNOSIS — E11621 Type 2 diabetes mellitus with foot ulcer: Secondary | ICD-10-CM | POA: Diagnosis not present

## 2015-03-25 DIAGNOSIS — M5135 Other intervertebral disc degeneration, thoracolumbar region: Secondary | ICD-10-CM

## 2015-03-25 DIAGNOSIS — M5136 Other intervertebral disc degeneration, lumbar region: Secondary | ICD-10-CM | POA: Diagnosis not present

## 2015-03-25 DIAGNOSIS — M1611 Unilateral primary osteoarthritis, right hip: Secondary | ICD-10-CM

## 2015-03-25 DIAGNOSIS — M791 Myalgia: Secondary | ICD-10-CM | POA: Diagnosis not present

## 2015-03-25 DIAGNOSIS — M179 Osteoarthritis of knee, unspecified: Secondary | ICD-10-CM | POA: Insufficient documentation

## 2015-03-25 DIAGNOSIS — M545 Low back pain: Secondary | ICD-10-CM | POA: Diagnosis not present

## 2015-03-25 DIAGNOSIS — M5416 Radiculopathy, lumbar region: Secondary | ICD-10-CM | POA: Diagnosis not present

## 2015-03-25 DIAGNOSIS — M533 Sacrococcygeal disorders, not elsewhere classified: Secondary | ICD-10-CM

## 2015-03-25 DIAGNOSIS — M160 Bilateral primary osteoarthritis of hip: Secondary | ICD-10-CM

## 2015-03-25 DIAGNOSIS — E0840 Diabetes mellitus due to underlying condition with diabetic neuropathy, unspecified: Secondary | ICD-10-CM

## 2015-03-25 DIAGNOSIS — L97519 Non-pressure chronic ulcer of other part of right foot with unspecified severity: Secondary | ICD-10-CM | POA: Insufficient documentation

## 2015-03-25 DIAGNOSIS — E1140 Type 2 diabetes mellitus with diabetic neuropathy, unspecified: Secondary | ICD-10-CM | POA: Insufficient documentation

## 2015-03-25 DIAGNOSIS — M47816 Spondylosis without myelopathy or radiculopathy, lumbar region: Secondary | ICD-10-CM

## 2015-03-25 DIAGNOSIS — M461 Sacroiliitis, not elsewhere classified: Secondary | ICD-10-CM | POA: Diagnosis not present

## 2015-03-25 DIAGNOSIS — M47817 Spondylosis without myelopathy or radiculopathy, lumbosacral region: Secondary | ICD-10-CM | POA: Diagnosis not present

## 2015-03-25 MED ORDER — METHADONE HCL 10 MG PO TABS: ORAL_TABLET | ORAL | Status: DC

## 2015-03-25 MED ORDER — OXYCODONE HCL 5 MG PO TABS: ORAL_TABLET | ORAL | Status: DC

## 2015-03-25 NOTE — Patient Instructions (Signed)
PLAN    Continue present medication oxycodone and methadone  F/U PCP Dr. Venia Minks for evaliation of  BP diabetes mellitus, ulceration of right foot and general medical  Condition  F/U wound care clinic evaluation as discussed  F/U surgical evaluation. May consider pending follow-up evaluations  F/U neurological evaluation. May consider pending follow-up evaluations  May consider radiofrequency rhizolysis or intraspinal procedures pending response to present treatment and F/U evaluation   Patient to call [facility] should patient have concerns prior to scheduled return appointment.

## 2015-03-25 NOTE — Progress Notes (Signed)
Subjective:    Patient ID: Amy Meyers, female    DOB: Mar 31, 1961, 54 y.o.   MRN: [id number]  HPI  Patient is 54 year old female who [facility] evaluation and treatment of pain involving the lower back and lower extremity regions. The patient states that she has infection of the right foot. Patient is with history of ulcerations of the right foot and is with diabetes mellitus as well. We had previously performed lumbar sympathetic block to promote healing of ulcerations of the foot. At the present time we will avoid interventional treatment. Patient is with suspected infection of the foot. We will consider interventional treatment pending further assessment of patient's condition and pending response to the present treatment regimen. The patient will follow-up with Dr. Venia Minks as well as undergo follow-up evaluation in the wound care clinic for the lesion of the right foot at this time. All understanding and in agreement status treatment plan. We will continue medications methadone and oxycodone and may consider interventional treatment should there be need to consider such as discussed with patient on today's visit. The patient was with understanding and in agreement with suggested treatment plan     Review of Systems     Objective:   Physical Exam  There was tenderness of the splenius capitis and occipitalis musculature regions of mild degree there was mild tinnitus of the cervical facet cervical paraspinal musculature region was mild tenderness over the region of the thoracic facet thoracic paraspinal musculature region. Palpation of the acromial clavicular and glenohumeral joint regions reproduced minimal discomfort. Patient appeared to be with bilaterally equal grip strength and Tinel and Phalen's maneuver were without increase of pain of significant degree. Palpation over the lumbar facet lumbar paraspinal muscular musculature region was a tends to palpation  of moderate degree. Lateral bending and rotation and extension and palpation of the lumbar facets reproduce moderate discomfort. There was moderate tends to palpation of the PSIS PII S region as well as the gluteal and piriformis musculature regions. Mild tends to palpation greater trochanteric region and iliotibial band region. X right knee was a tends to palpation in crepitus of the knee with negative anterior and posterior drawer signs. There was no ballottement of the patella. No increased warmth or erythema of the right knee was noted. The right foot was with evidence of ulceration plantar aspect of the right foot beneath the great toe. There was decreased EHL strength. There appeared to be decreased sensation of the lower extremities in a stocking type distribution. Abdomen was nontender and no costovertebral angle tenderness was noted. There was negative clonus negative Homans      Assessment & Plan:     Diabetes mellitus with diabetic neuropathy  Diabetic foot ulcer  Degenerative disc disease lumbar spine Multilevel degenerative changes of the lumbar spine with L4-L5 level involvement most significantly  Lumbar facet syndrome  Status post right total hip replacement  Degenerative joint disease right knee  Sacroiliac joint dysfunction   PLAN  Continue present medications oxycodone and methadone  a F/U PCP for Dr. Venia Minks evaliation of  diabetes mellitus and general medical  condition. Patient is to follow-up with Dr. Venia Minks to discuss further treatment of diabetic foot ulcer wi and follow-up with wound care clinic evaluation  Wound care clinic evaluation as discussed with patient  F/U surgical evaluation  F/U neurological evaluation  May consider radiofrequency rhizolysis or intraspinal procedures pending response to present treatment and F/U evaluation.  Patient to call  [facility] should patient have concerns prior to scheduled return appointment.

## 2015-03-31 ENCOUNTER — Other Ambulatory Visit: Payer: Self-pay | Admitting: Pain Medicine

## 2015-04-01 ENCOUNTER — Other Ambulatory Visit: Payer: Self-pay

## 2015-04-01 DIAGNOSIS — Z794 Long term (current) use of insulin: Principal | ICD-10-CM

## 2015-04-01 DIAGNOSIS — E0840 Diabetes mellitus due to underlying condition with diabetic neuropathy, unspecified: Secondary | ICD-10-CM

## 2015-04-01 MED ORDER — INSULIN GLARGINE 100 UNIT/ML ~~LOC~~ SOLN: 100.0000 [IU] | Freq: Two times a day (BID) | SUBCUTANEOUS | Status: DC

## 2015-04-06 ENCOUNTER — Encounter: Payer: Self-pay | Admitting: Family Medicine

## 2015-04-06 ENCOUNTER — Telehealth: Payer: Self-pay | Admitting: Family Medicine

## 2015-04-06 ENCOUNTER — Ambulatory Visit (INDEPENDENT_AMBULATORY_CARE_PROVIDER_SITE_OTHER): Payer: Medicare Other | Admitting: Family Medicine

## 2015-04-06 VITALS — BP 138/76 | HR 88 | Temp 99.1°F | Resp 16 | Wt 230.0 lb

## 2015-04-06 DIAGNOSIS — G629 Polyneuropathy, unspecified: Secondary | ICD-10-CM | POA: Insufficient documentation

## 2015-04-06 DIAGNOSIS — E11621 Type 2 diabetes mellitus with foot ulcer: Secondary | ICD-10-CM | POA: Diagnosis not present

## 2015-04-06 DIAGNOSIS — I10 Essential (primary) hypertension: Secondary | ICD-10-CM | POA: Diagnosis not present

## 2015-04-06 DIAGNOSIS — M86271 Subacute osteomyelitis, right ankle and foot: Secondary | ICD-10-CM

## 2015-04-06 DIAGNOSIS — L97519 Non-pressure chronic ulcer of other part of right foot with unspecified severity: Secondary | ICD-10-CM | POA: Diagnosis not present

## 2015-04-06 DIAGNOSIS — Z794 Long term (current) use of insulin: Secondary | ICD-10-CM

## 2015-04-06 DIAGNOSIS — M549 Dorsalgia, unspecified: Secondary | ICD-10-CM

## 2015-04-06 DIAGNOSIS — L97509 Non-pressure chronic ulcer of other part of unspecified foot with unspecified severity: Secondary | ICD-10-CM

## 2015-04-06 DIAGNOSIS — E0840 Diabetes mellitus due to underlying condition with diabetic neuropathy, unspecified: Secondary | ICD-10-CM | POA: Diagnosis not present

## 2015-04-06 DIAGNOSIS — G8929 Other chronic pain: Secondary | ICD-10-CM | POA: Insufficient documentation

## 2015-04-06 DIAGNOSIS — M1990 Unspecified osteoarthritis, unspecified site: Secondary | ICD-10-CM | POA: Insufficient documentation

## 2015-04-06 HISTORY — DX: Non-pressure chronic ulcer of other part of unspecified foot with unspecified severity: L97.509

## 2015-04-06 HISTORY — DX: Type 2 diabetes mellitus with foot ulcer: E11.621

## 2015-04-06 LAB — POCT GLYCOSYLATED HEMOGLOBIN (HGB A1C): HEMOGLOBIN A1C: 7.6

## 2015-04-06 MED ORDER — AMOXICILLIN-POT CLAVULANATE 875-125 MG PO TABS: 1.0000 | ORAL_TABLET | Freq: Two times a day (BID) | ORAL | Status: DC

## 2015-04-06 MED ORDER — AMOXICILLIN-POT CLAVULANATE ER 1000-62.5 MG PO TB12: 2.0000 | ORAL_TABLET | Freq: Two times a day (BID) | ORAL | Status: DC

## 2015-04-06 MED ORDER — INSULIN ASPART 100 UNIT/ML ~~LOC~~ SOLN: 20.0000 [IU] | Freq: Three times a day (TID) | SUBCUTANEOUS | Status: DC

## 2015-04-06 MED ORDER — AMOXICILLIN 500 MG PO CAPS: 1000.0000 mg | ORAL_CAPSULE | Freq: Two times a day (BID) | ORAL | Status: DC

## 2015-04-06 MED ORDER — INSULIN GLARGINE 100 UNIT/ML ~~LOC~~ SOLN: 100.0000 [IU] | Freq: Two times a day (BID) | SUBCUTANEOUS | Status: DC

## 2015-04-06 NOTE — Telephone Encounter (Signed)
Pt advised; she is going to get her labs done tomorrow.  Thanks,   -Mickel Baas

## 2015-04-06 NOTE — Telephone Encounter (Signed)
Can take lab slip to hospital and they can do it at out-patient labs.

## 2015-04-06 NOTE — Progress Notes (Signed)
Patient ID: Amy Meyers, female   DOB: [birth year], 54 y.o.   MRN: [id number]         [name] Female    DOB: 01-06-1961   54 y.o.   MRN: [id number] Visit Date: 04/06/2015  Today's Provider: Margarita Rana, MD   Chief Complaint  Patient presents with  . Diabetes  . Foot Ulcer   Subjective:    Diabetes She presents for her follow-up diabetic visit. She has type 2 diabetes mellitus. Her disease course has been worsening (Pt reports her blood sugar has been elevated since her foot infection). Hypoglycemia symptoms include tremors. Pertinent negatives for hypoglycemia include no dizziness or headaches. (Pt reports Hypoglycemic episode 1 -2 times a month. ) Associated symptoms include fatigue, foot paresthesias and foot ulcerations. Pertinent negatives for diabetes include no blurred vision, no chest pain, no polydipsia, no polyphagia, no polyuria, no visual change, no weakness and no weight loss. Current diabetic treatment includes insulin injections. She is compliant with treatment most of the time (Pt thinks she may have forgotten to take her insulin a few times. ). Her weight is stable. Her home blood glucose trend is increasing steadily. (Blood sugars runs from mid 100's to 200's.) She does not see a podiatrist.Eye exam is not current.  Hypertension This is a chronic problem. The problem is controlled. Associated symptoms include shortness of breath. Pertinent negatives include no blurred vision, chest pain or headaches. Risk factors for coronary artery disease include diabetes mellitus. There are no compliance problems.    Foot Ulcer: Patient complains of a foot ulcer. The patient has had an ulcer of right Foot for 1 year. This has been treated at home, [facility], and Podiatry with Antibiotic dressing changes. Past history significant for diabetes and tobacco use. The patient reports fever and pain. Patient does have a history of diabetes mellitus.     Lab Results    Component Value Date   HGBA1C 7.6 04/06/2015       Allergies  Allergen Reactions  . Meloxicam Itching, Swelling and Nausea And Vomiting  . Sulfa Antibiotics Other (See Comments)   Previous Medications   ASPIRIN 325 MG TABLET    Take 325 mg by mouth daily.   GARLIC 10 MG CAPS    Take by mouth 2 (two) times daily.   INSULIN ASPART (NOVOLOG) 100 UNIT/ML INJECTION    Inject into the skin. Sliding scale. If blood sugar between 200-399 administer 20 units, 400 and over use 40 units   INSULIN GLARGINE (LANTUS) 100 UNIT/ML INJECTION    Inject 1 mL (100 Units total) into the skin 2 (two) times daily.   LORAZEPAM (ATIVAN) 1 MG TABLET    Take 1 mg by mouth 2 (two) times daily as needed for anxiety.   METHADONE (DOLOPHINE) 10 MG TABLET    Limit 5-7 tabs by mouth daily if tolerated   OMEGA-3 FATTY ACIDS (FISH OIL) 1000 MG CAPS    Take by mouth 2 (two) times daily.   OXYCODONE (OXY IR/ROXICODONE) 5 MG IMMEDIATE RELEASE TABLET    Limit 4 - 6 tabs po / day if tolerated   PROMETHAZINE (PHENERGAN) 25 MG TABLET    Take 25 mg by mouth. 1-2 tabs bid prn    Review of Systems  Constitutional: Positive for chills, diaphoresis and fatigue. Negative for fever, weight loss, activity change, appetite change and unexpected weight change.  Eyes: Negative for blurred vision.  Respiratory: Positive for cough, shortness of breath and  wheezing. Negative for apnea, choking, chest tightness and stridor.        History of smoking; pt reports it is stable.   Cardiovascular: Negative.  Negative for chest pain.  Gastrointestinal: Positive for constipation (Chronic issue). Negative for nausea, vomiting, abdominal pain, diarrhea, blood in stool, abdominal distention, anal bleeding and rectal pain.  Endocrine: Negative for polydipsia, polyphagia and polyuria.  Neurological: Positive for tremors. Negative for dizziness, weakness, light-headedness and headaches.    Social History  Substance Use Topics  . Smoking status:  Current Every Day Smoker -- 1.00 packs/day for 44 years    Types: Cigarettes  . Smokeless tobacco: Not on file  . Alcohol Use: No   Objective:   BP 138/76 mmHg  Pulse 88  Temp(Src) 99.1 F (37.3 C) (Oral)  Resp 16  Wt 230 lb (104.327 kg)  Physical Exam  Constitutional: She is oriented to person, place, and time. She appears well-developed and well-nourished.  Cardiovascular: Normal rate and regular rhythm.   Pulmonary/Chest: Effort normal and breath sounds normal.  Neurological: She is alert and oriented to person, place, and time.  Skin:  2.5cm callus in right foot.  (Not draining right now.)  Psychiatric: She has a normal mood and affect. Her behavior is normal. Judgment and thought content normal.        Assessment & Plan:     1. Type 2 diabetes mellitus with right diabetic foot ulcer   See below.  - POCT glycosylated hemoglobin (Hb A1C) Results for orders placed or performed in visit on 04/06/15  POCT glycosylated hemoglobin (Hb A1C)  Result Value Ref Range   Hemoglobin A1C 7.6     2. Diabetes mellitus due to underlying condition with diabetic neuropathy, with long-term current use of insulin (HCC) A1C elevated some to 7.6% (Up from 6.7% at last check.)  The increase is likely secondary to her recurrent Osteomyelitis.  No changes in her medication right now, pt will continue to monitor at home.  Recheck A1C in three months.  Refills of her Insulin and Lantus provided.  - Comprehensive metabolic panel  3. Essential hypertension Stable.   4. Subacute osteomyelitis of right foot (HCC) Worsening since completing Augmentin, check labs today.  MRI from 01/2014 show Osteomyelitis in the same area as her infection now.  Will treat longer with a high dose Augmentin;  Recheck in one month so see how much longer she needs to remain on the antibiotic.   - CBC with Differential/Platelet - amoxicillin-clavulanate (AUGMENTIN XR) 1000-62.5 MG 12 hr tablet; Take 2 tablets by mouth 2  (two) times daily. For 4-6 weeks.  Dispense: 120 tablet; Refill: 1 - Sed Rate (ESR) - C-reactive protein      Margarita Rana, MD  [facility]

## 2015-04-06 NOTE — Telephone Encounter (Signed)
Pt said she went to get her lab work done at [facility] and they would not do her labs because  She owed a past due bill that is in collections.  She wants to know if there is another place she can get her labs done.  Her call back is [phone]  Thanks Con Memos

## 2015-04-06 NOTE — Addendum Note (Signed)
Addended by: Ashley Royalty E on: 04/06/2015 04:51 PM   Modules accepted: Orders

## 2015-04-07 ENCOUNTER — Other Ambulatory Visit
Admission: RE | Admit: 2015-04-07 | Discharge: 2015-04-07 | Disposition: A | Discharge status: Home / Self Care | Payer: Medicare Other | Source: Ambulatory Visit | Attending: Family Medicine | Admitting: Family Medicine

## 2015-04-07 DIAGNOSIS — M86271 Subacute osteomyelitis, right ankle and foot: Secondary | ICD-10-CM | POA: Insufficient documentation

## 2015-04-07 LAB — COMPREHENSIVE METABOLIC PANEL
ALBUMIN: 3.7 g/dL (ref 3.5–5.0)
ALK PHOS: 75 U/L (ref 38–126)
ALT: 19 U/L (ref 14–54)
AST: 19 U/L (ref 15–41)
Anion gap: 5 (ref 5–15)
BUN: 19 mg/dL (ref 6–20)
CHLORIDE: 98 mmol/L — AB (ref 101–111)
CO2: 33 mmol/L — AB (ref 22–32)
CREATININE: 0.85 mg/dL (ref 0.44–1.00)
Calcium: 8.6 mg/dL — ABNORMAL LOW (ref 8.9–10.3)
GFR calc Af Amer: >60 mL/min (ref >60)
GFR calc non Af Amer: >60 mL/min (ref >60)
Glucose, Bld: 262 mg/dL — ABNORMAL HIGH (ref 65–99)
Potassium: 4.5 mmol/L (ref 3.5–5.1)
SODIUM: 136 mmol/L (ref 135–145)
Total Bilirubin: 0.3 mg/dL (ref 0.3–1.2)
Total Protein: 7.6 g/dL (ref 6.5–8.1)

## 2015-04-07 LAB — CBC WITH DIFFERENTIAL/PLATELET
BASOS ABS: 0.1 10*3/uL (ref 0–0.1)
BASOS PCT: 1 %
EOS ABS: 0.2 10*3/uL (ref 0–0.7)
EOS PCT: 2 %
HCT: 45.1 % (ref 35.0–47.0)
HEMOGLOBIN: 14.9 g/dL (ref 12.0–16.0)
LYMPHS ABS: 2.9 10*3/uL (ref 1.0–3.6)
Lymphocytes Relative: 28 %
MCH: 27.9 pg (ref 26.0–34.0)
MCHC: 33 g/dL (ref 32.0–36.0)
MCV: 84.4 fL (ref 80.0–100.0)
Monocytes Absolute: 0.6 10*3/uL (ref 0.2–0.9)
Monocytes Relative: 6 %
NEUTROS PCT: 63 %
Neutro Abs: 6.6 10*3/uL — ABNORMAL HIGH (ref 1.4–6.5)
PLATELETS: 126 10*3/uL — AB (ref 150–440)
RBC: 5.34 MIL/uL — AB (ref 3.80–5.20)
RDW: 16.8 % — ABNORMAL HIGH (ref 11.5–14.5)
WBC: 10.5 10*3/uL (ref 3.6–11.0)

## 2015-04-07 LAB — C-REACTIVE PROTEIN: CRP: 1.3 mg/dL — AB (ref <1.0)

## 2015-04-07 LAB — SEDIMENTATION RATE: SED RATE: 6 mm/h (ref 0–30)

## 2015-04-09 ENCOUNTER — Telehealth: Payer: Self-pay

## 2015-04-09 NOTE — Telephone Encounter (Signed)
Left message with daughter to call back. Renaldo Fiddler, CMA

## 2015-04-09 NOTE — Telephone Encounter (Signed)
-----   Message from Margarita Rana, MD sent at 04/08/2015  2:05 PM EST ----- CRP very mildly elevated otherwise labs normal. Not sure if this is clinically significant.  Recheck in 4 weeks at follow up.  Thanks.

## 2015-04-13 ENCOUNTER — Other Ambulatory Visit: Payer: Self-pay

## 2015-04-13 DIAGNOSIS — F419 Anxiety disorder, unspecified: Secondary | ICD-10-CM

## 2015-04-13 MED ORDER — LORAZEPAM 1 MG PO TABS: 1.0000 mg | ORAL_TABLET | Freq: Two times a day (BID) | ORAL | Status: DC | PRN

## 2015-04-13 NOTE — Telephone Encounter (Signed)
Ok to call in rx.  Thanks.  

## 2015-04-21 ENCOUNTER — Encounter: Payer: Medicare Other | Admitting: Pain Medicine

## 2015-04-26 ENCOUNTER — Ambulatory Visit: Payer: Medicare Other | Attending: Pain Medicine | Admitting: Pain Medicine

## 2015-04-26 ENCOUNTER — Encounter: Payer: Self-pay | Admitting: Pain Medicine

## 2015-04-26 VITALS — BP 146/67 | HR 74 | Temp 98.2°F | Resp 16 | Ht 66.0 in | Wt 220.0 lb

## 2015-04-26 DIAGNOSIS — M5136 Other intervertebral disc degeneration, lumbar region: Secondary | ICD-10-CM | POA: Diagnosis not present

## 2015-04-26 DIAGNOSIS — M47816 Spondylosis without myelopathy or radiculopathy, lumbar region: Secondary | ICD-10-CM

## 2015-04-26 DIAGNOSIS — E1140 Type 2 diabetes mellitus with diabetic neuropathy, unspecified: Secondary | ICD-10-CM | POA: Insufficient documentation

## 2015-04-26 DIAGNOSIS — M1711 Unilateral primary osteoarthritis, right knee: Secondary | ICD-10-CM | POA: Diagnosis not present

## 2015-04-26 DIAGNOSIS — M160 Bilateral primary osteoarthritis of hip: Secondary | ICD-10-CM

## 2015-04-26 DIAGNOSIS — M79605 Pain in left leg: Secondary | ICD-10-CM | POA: Diagnosis present

## 2015-04-26 DIAGNOSIS — Z96641 Presence of right artificial hip joint: Secondary | ICD-10-CM | POA: Insufficient documentation

## 2015-04-26 DIAGNOSIS — M5135 Other intervertebral disc degeneration, thoracolumbar region: Secondary | ICD-10-CM

## 2015-04-26 DIAGNOSIS — E11621 Type 2 diabetes mellitus with foot ulcer: Secondary | ICD-10-CM

## 2015-04-26 DIAGNOSIS — M1611 Unilateral primary osteoarthritis, right hip: Secondary | ICD-10-CM

## 2015-04-26 DIAGNOSIS — L97519 Non-pressure chronic ulcer of other part of right foot with unspecified severity: Secondary | ICD-10-CM

## 2015-04-26 DIAGNOSIS — M47817 Spondylosis without myelopathy or radiculopathy, lumbosacral region: Secondary | ICD-10-CM | POA: Diagnosis not present

## 2015-04-26 DIAGNOSIS — M5416 Radiculopathy, lumbar region: Secondary | ICD-10-CM | POA: Diagnosis not present

## 2015-04-26 DIAGNOSIS — M461 Sacroiliitis, not elsewhere classified: Secondary | ICD-10-CM | POA: Diagnosis not present

## 2015-04-26 DIAGNOSIS — M545 Low back pain: Secondary | ICD-10-CM | POA: Diagnosis present

## 2015-04-26 DIAGNOSIS — M533 Sacrococcygeal disorders, not elsewhere classified: Secondary | ICD-10-CM

## 2015-04-26 DIAGNOSIS — E0840 Diabetes mellitus due to underlying condition with diabetic neuropathy, unspecified: Secondary | ICD-10-CM

## 2015-04-26 DIAGNOSIS — M79604 Pain in right leg: Secondary | ICD-10-CM | POA: Diagnosis present

## 2015-04-26 DIAGNOSIS — M791 Myalgia: Secondary | ICD-10-CM | POA: Diagnosis not present

## 2015-04-26 MED ORDER — OXYCODONE HCL 5 MG PO TABS: ORAL_TABLET | ORAL | Status: DC

## 2015-04-26 MED ORDER — METHADONE HCL 10 MG PO TABS: ORAL_TABLET | ORAL | Status: DC

## 2015-04-26 NOTE — Progress Notes (Signed)
Safety precautions to be maintained throughout the outpatient stay will include: orient to surroundings, keep bed in low position, maintain call bell within reach at all times, provide assistance with transfer out of bed and ambulation.  

## 2015-04-26 NOTE — Progress Notes (Signed)
   Subjective:    Patient ID: Amy Meyers, female    DOB: Aug 11, 1960, 54 y.o.   MRN: [id number]  HPI the patient is a 54 year old female who returns to pain management for further evaluation and treatment of pain involving the lower back and lower extremity region. Patient is with diabetes mellitus and has had diabetic ulcers of the foot occurring. At the present time patient is with severe pain of the right lower extremity. There is concern regarding component of pain due to diabetic neuropathy and vasculopathy. We will proceed with lumbar sympathetic block at time return appointment in attempt to decrease severity of symptoms, minimize progression of symptoms, and avoid need for more involved treatment. The patient will continue to the care of Dr. Venia Minks and wound care clinic. We will proceed with lumbar sympathetic block in attempt to avoid patient being considered for amputation of lower extremity or other more involved treatment. Patient is understanding and agrees to suggested treatment plan     Review of Systems     Objective:   Physical Exam  There was tenderness to palpation of the splenius capitis and occipitalis regions of mild degree. There was mild tenderness over the cervical facet cervical paraspinal musculature regions. There was unremarkable Spurling's maneuver. Patient appeared to be with bilaterally equal grip strength. Tinel and Phalen's maneuver were without increase of pain of significant degree. There was tenderness over the thoracic facet thoracic paraspinal musculature region a mild degree. Palpation over the lumbar facet lumbar paraspinal muscles reproduced pain of moderate degree. There was tenderness over the PSIS and PII S region a moderate degree. There was mild tenderness of the greater trochanteric region iliotibial band region. The left lower extremity was with evidence of tenderness to palpation of the lower extremity. The right lower extremity was was severe  tenderness to palpation and was with evidence of prior ulcerations of the right foot. There were areas of decreased sensation as well as areas of increased sensitivity to touch of the lower extremity. EHL strength was decreased. The abdomen was nontender with no costovertebral tenderness noted    Assessment & Plan:     Diabetes mellitus with diabetic neuropathy  Diabetic foot ulcer  Degenerative disc disease lumbar spine Multilevel degenerative changes of the lumbar spine with L4-L5 level involvement most significantly  Lumbar facet syndrome  Status post right total hip replacement  Degenerative joint disease right knee    PLAN   Continue present medications oxycodone and methadone  Lumbar sympathetic block to be performed at time of return appointment  F/U PCP Dr. Venia Minks for evaliation of  BP and general medical  Condition.  F/U wound care clinic evaluation as we discussed  F/U surgical evaluation  F/U neurological evaluation  May consider radiofrequency rhizolysis or intraspinal procedures pending response to present treatment and F/U evaluation.  Patient to call [facility] should patient have concerns prior to scheduled return appointmen

## 2015-04-26 NOTE — Patient Instructions (Addendum)
Continue present medications oxycodone and methadone  Lumbar sympathetic block to be performed at time of return appointment  F/U PCP Dr. Venia Minks for evaliation of  BP and general medical  Condition.  F/U wound care clinic evaluation as we discussed  F/U surgical evaluation  F/U neurological evaluation  May consider radiofrequency rhizolysis or intraspinal procedures pending response to present treatment and F/U evaluation.  Patient to call Pain Management Center should patient have concerns prior to scheduled return appointmentPain Management Discharge Instructions  General Discharge Instructions :  If you need to reach your doctor call: Monday-Friday 8:00 am - 4:00 pm at [phone] or toll free [phone].  After clinic hours [phone] to have operator reach doctor.  Bring all of your medication bottles to all your appointments in the pain clinic.  To cancel or reschedule your appointment with Pain Management please remember to call 24 hours in advance to avoid a fee.  Refer to the educational materials which you have been given on: General Risks, I had my Procedure. Discharge Instructions, Post Sedation.  Post Procedure Instructions:  The drugs you were given will stay in your system until tomorrow, so for the next 24 hours you should not drive, make any legal decisions or drink any alcoholic beverages.  You may eat anything you prefer, but it is better to start with liquids then soups and crackers, and gradually work up to solid foods.  Please notify your doctor immediately if you have any unusual bleeding, trouble breathing or pain that is not related to your normal pain.  Depending on the type of procedure that was done, some parts of your body may feel week and/or numb.  This usually clears up by tonight or the next day.  Walk with the use of an assistive device or accompanied by an adult for the 24 hours.  You may use ice on the affected area for the first 24  hours.  Put ice in a Ziploc bag and cover with a towel and place against area 15 minutes on 15 minutes off.  You may switch to heat after 24 hours.GENERAL RISKS AND COMPLICATIONS  What are the risk, side effects and possible complications? Generally speaking, most procedures are safe.  However, with any procedure there are risks, side effects, and the possibility of complications.  The risks and complications are dependent upon the sites that are lesioned, or the type of nerve block to be performed.  The closer the procedure is to the spine, the more serious the risks are.  Great care is taken when placing the radio frequency needles, block needles or lesioning probes, but sometimes complications can occur. 1. Infection: Any time there is an injection through the skin, there is a risk of infection.  This is why sterile conditions are used for these blocks.  There are four possible types of infection. 1. Localized skin infection. 2. Central Nervous System Infection-This can be in the form of Meningitis, which can be deadly. 3. Epidural Infections-This can be in the form of an epidural abscess, which can cause pressure inside of the spine, causing compression of the spinal cord with subsequent paralysis. This would require an emergency surgery to decompress, and there are no guarantees that the patient would recover from the paralysis. 4. Discitis-This is an infection of the intervertebral discs.  It occurs in about 1% of discography procedures.  It is difficult to treat and it may lead to surgery.        2. Pain: the needles  have to go through skin and soft tissues, will cause soreness.       3. Damage to internal structures:  The nerves to be lesioned may be near blood vessels or    other nerves which can be potentially damaged.       4. Bleeding: Bleeding is more common if the patient is taking blood thinners such as  aspirin, Coumadin, Ticiid, Plavix, etc., or if he/she have some genetic  predisposition  such as hemophilia. Bleeding into the spinal canal can cause compression of the spinal  cord with subsequent paralysis.  This would require an emergency surgery to  decompress and there are no guarantees that the patient would recover from the  paralysis.       5. Pneumothorax:  Puncturing of a lung is a possibility, every time a needle is introduced in  the area of the chest or upper back.  Pneumothorax refers to free air around the  collapsed lung(s), inside of the thoracic cavity (chest cavity).  Another two possible  complications related to a similar event would include: Hemothorax and Chylothorax.   These are variations of the Pneumothorax, where instead of air around the collapsed  lung(s), you may have blood or chyle, respectively.       6. Spinal headaches: They may occur with any procedures in the area of the spine.       7. Persistent CSF (Cerebro-Spinal Fluid) leakage: This is a rare problem, but may occur  with prolonged intrathecal or epidural catheters either due to the formation of a fistulous  track or a dural tear.       8. Nerve damage: By working so close to the spinal cord, there is always a possibility of  nerve damage, which could be as serious as a permanent spinal cord injury with  paralysis.       9. Death:  Although rare, severe deadly allergic reactions known as "Anaphylactic  reaction" can occur to any of the medications used.      10. Worsening of the symptoms:  We can always make thing worse.  What are the chances of something like this happening? Chances of any of this occuring are extremely low.  By statistics, you have more of a chance of getting killed in a motor vehicle accident: while driving to the hospital than any of the above occurring .  Nevertheless, you should be aware that they are possibilities.  In general, it is similar to taking a shower.  Everybody knows that you can slip, hit your head and get killed.  Does that mean that you should not  shower again?  Nevertheless always keep in mind that statistics do not mean anything if you happen to be on the wrong side of them.  Even if a procedure has a 1 (one) in a 1,000,000 (million) chance of going wrong, it you happen to be that one..Also, keep in mind that by statistics, you have more of a chance of having something go wrong when taking medications.  Who should not have this procedure? If you are on a blood thinning medication (e.g. Coumadin, Plavix, see list of "Blood Thinners"), or if you have an active infection going on, you should not have the procedure.  If you are taking any blood thinners, please inform your physician.  How should I prepare for this procedure?  Do not eat or drink anything at least six hours prior to the procedure.  Bring a driver with you .  It cannot be a taxi.  Come accompanied by an adult that can drive you back, and that is strong enough to help you if your legs get weak or numb from the local anesthetic.  Take all of your medicines the morning of the procedure with just enough water to swallow them.  If you have diabetes, make sure that you are scheduled to have your procedure done first thing in the morning, whenever possible.  If you have diabetes, take only half of your insulin dose and notify our nurse that you have done so as soon as you arrive at the clinic.  If you are diabetic, but only take blood sugar pills (oral hypoglycemic), then do not take them on the morning of your procedure.  You may take them after you have had the procedure.  Do not take aspirin or any aspirin-containing medications, at least eleven (11) days prior to the procedure.  They may prolong bleeding.  Wear loose fitting clothing that may be easy to take off and that you would not mind if it got stained with Betadine or blood.  Do not wear any jewelry or perfume  Remove any nail coloring.  It will interfere with some of our monitoring equipment.  NOTE: Remember that  this is not meant to be interpreted as a complete list of all possible complications.  Unforeseen problems may occur.  BLOOD THINNERS The following drugs contain aspirin or other products, which can cause increased bleeding during surgery and should not be taken for 2 weeks prior to and 1 week after surgery.  If you should need take something for relief of minor pain, you may take acetaminophen which is found in Tylenol,m Datril, Anacin-3 and Panadol. It is not blood thinner. The products listed below are.  Do not take any of the products listed below in addition to any listed on your instruction sheet.  A.P.C or A.P.C with Codeine Codeine Phosphate Capsules #3 Ibuprofen Ridaura  ABC compound Congesprin Imuran rimadil  Advil Cope Indocin Robaxisal  Alka-Seltzer Effervescent Pain Reliever and Antacid Coricidin or Coricidin-D  Indomethacin Rufen  Alka-Seltzer plus Cold Medicine Cosprin Ketoprofen S-A-C Tablets  Anacin Analgesic Tablets or Capsules Coumadin Korlgesic Salflex  Anacin Extra Strength Analgesic tablets or capsules CP-2 Tablets Lanoril Salicylate  Anaprox Cuprimine Capsules Levenox Salocol  Anexsia-D Dalteparin Magan Salsalate  Anodynos Darvon compound Magnesium Salicylate Sine-off  Ansaid Dasin Capsules Magsal Sodium Salicylate  Anturane Depen Capsules Marnal Soma  APF Arthritis pain formula Dewitt's Pills Measurin Stanback  Argesic Dia-Gesic Meclofenamic Sulfinpyrazone  Arthritis Bayer Timed Release Aspirin Diclofenac Meclomen Sulindac  Arthritis pain formula Anacin Dicumarol Medipren Supac  Analgesic (Safety coated) Arthralgen Diffunasal Mefanamic Suprofen  Arthritis Strength Bufferin Dihydrocodeine Mepro Compound Suprol  Arthropan liquid Dopirydamole Methcarbomol with Aspirin Synalgos  ASA tablets/Enseals Disalcid Micrainin Tagament  Ascriptin Doan's Midol Talwin  Ascriptin A/D Dolene Mobidin Tanderil  Ascriptin Extra Strength Dolobid Moblgesic Ticlid  Ascriptin with Codeine  Doloprin or Doloprin with Codeine Momentum Tolectin  Asperbuf Duoprin Mono-gesic Trendar  Aspergum Duradyne Motrin or Motrin IB Triminicin  Aspirin plain, buffered or enteric coated Durasal Myochrisine Trigesic  Aspirin Suppositories Easprin Nalfon Trillsate  Aspirin with Codeine Ecotrin Regular or Extra Strength Naprosyn Uracel  Atromid-S Efficin Naproxen Ursinus  Auranofin Capsules Elmiron Neocylate Vanquish  Axotal Emagrin Norgesic Verin  Azathioprine Empirin or Empirin with Codeine Normiflo Vitamin E  Azolid Emprazil Nuprin Voltaren  Bayer Aspirin plain, buffered or children's or timed BC Tablets or powders Encaprin Orgaran Warfarin Sodium  Buff-a-Comp Enoxaparin Orudis Zorpin  Buff-a-Comp with Codeine Equegesic Os-Cal-Gesic   Buffaprin Excedrin plain, buffered or Extra Strength Oxalid   Bufferin Arthritis Strength Feldene Oxphenbutazone   Bufferin plain or Extra Strength Feldene Capsules Oxycodone with Aspirin   Bufferin with Codeine Fenoprofen Fenoprofen Pabalate or Pabalate-SF   Buffets II Flogesic Panagesic   Buffinol plain or Extra Strength Florinal or Florinal with Codeine Panwarfarin   Buf-Tabs Flurbiprofen Penicillamine   Butalbital Compound Four-way cold tablets Penicillin   Butazolidin Fragmin Pepto-Bismol   Carbenicillin Geminisyn Percodan   Carna Arthritis Reliever Geopen Persantine   Carprofen Gold's salt Persistin   Chloramphenicol Goody's Phenylbutazone   Chloromycetin Haltrain Piroxlcam   Clmetidine heparin Plaquenil   Cllnoril Hyco-pap Ponstel   Clofibrate Hydroxy chloroquine Propoxyphen         Before stopping any of these medications, be sure to consult the physician who ordered them.  Some, such as Coumadin (Warfarin) are ordered to prevent or treat serious conditions such as "deep thrombosis", "pumonary embolisms", and other heart problems.  The amount of time that you may need off of the medication may also vary with the medication and the reason for which  you were taking it.  If you are taking any of these medications, please make sure you notify your pain physician before you undergo any procedures.         Lumbar Sympathetic Block Patient Information  Description: The lumbar plexus is a group of nerves that are part of the sympathetic nervous system.  These nerves supply organs in the pelvis and legs.  Lumbar sympathetic blocks are utilized for the diagnosis and treatment of painful conditions in these areas.   The lumbar plexus is located on both sides of the aorta at approximately the level of the second lumbar vertebral body.  The block will be performed with you lying on your abdomen with a pillow underneath.  Using direct x-ray guidance,   The plexus will be located on both sides of the spine.  Numbing medicine will be used to deaden the skin prior to needle insertion.  In most cases, a small amount of sedation can be give by IV prior to the numbing medicine.  One or two small needles will be placed near the plexus and local anesthetic will be injected.  This may make your leg(s) feel warm.  The Entire block usually lasts about 15-25 minutes.  Conditions which may be treated by lumbar sympathetic block:   Reflex sympathetic dystrophy  Phantom limb pain  Peripheral neuropathy  Peripheral vascular disease ( inadequate blood flow )  Cancer pain of pelvis, leg and kidney  Preparation for the injection:  1. Do note eat any solid food or diary products within 6 hours of your appointment. 2. You may drink clear liquids up to 2 hours before appointment.  Clear liquids include water, black coffee, juice or soda.  No milk or cream please. 3. You may take your regular medication, including pain medications, with a sip of water before you appointment.  Diabetics should hold regular insulin ( if taken separately ) and take 1/2 NPH dose the morning of the procedure .  Carry some sugar containing items with you to your appointment. 4. A  driver must accompany you and be prepared to drive you home after your procedure. 5. Bring all your current medication with you. 6. An IV may be inserted and sedation may be given at the discretion of the physician.  7. A blood pressure cuff, EKG  and other monitors will often be applied during the procedure.  Some patients may need to have extra oxygen administered for a short period. 8. You will be asked to provide medical information, including your allergies and medications, prior to the procedure.  We must know immediately if your taking blood thinners (like Coumadin/Warfarin) or if you are allergic to IV iodine contrast (dye).  We must know if you could possibly be pregnant.  Possible side-effects   Bleeding from needle site or deeper  Infection (rare, can require surgery)  Nerve injury (rare)  Numbness & tingling (temporary)  Collapsed lung (rare)  Spinal headache (a headache worse with upright posture)  Light-headedness (temporary)  Pain at injection site (several days)  Decreased blood pressure (temporary)  Weakness in legs (temporary)  Seizure or other drug reaction (rare)  Call if you experience:   Fever/chills associated with headache or increased back/ neck pain  Headache worsened by an upright position  New onset weakness or numbness of an extremity below the injection site  Hives or difficulty breathing ( go to the emergency room)  Inflammation or drainage at the injections site(s)  New symptoms which are concerning to you  Please note:  If effective, we will often do a series of 2-3 injections spaced 3-6 weeks apart to maximally decrease your pain.  If initial series is effective, you may be a candidate for a more permanent block of the lumbar sympathetic plexus.  If you have any questions please call [phone] [facility]

## 2015-05-03 ENCOUNTER — Encounter: Payer: Self-pay | Admitting: Pain Medicine

## 2015-05-03 ENCOUNTER — Ambulatory Visit: Payer: Medicare Other | Attending: Pain Medicine | Admitting: Pain Medicine

## 2015-05-03 VITALS — BP 120/62 | HR 77 | Temp 90.1°F | Resp 16 | Ht 66.0 in | Wt 220.0 lb

## 2015-05-03 DIAGNOSIS — M533 Sacrococcygeal disorders, not elsewhere classified: Secondary | ICD-10-CM

## 2015-05-03 DIAGNOSIS — M47816 Spondylosis without myelopathy or radiculopathy, lumbar region: Secondary | ICD-10-CM

## 2015-05-03 DIAGNOSIS — M5416 Radiculopathy, lumbar region: Secondary | ICD-10-CM | POA: Diagnosis not present

## 2015-05-03 DIAGNOSIS — M5135 Other intervertebral disc degeneration, thoracolumbar region: Secondary | ICD-10-CM

## 2015-05-03 DIAGNOSIS — E0840 Diabetes mellitus due to underlying condition with diabetic neuropathy, unspecified: Secondary | ICD-10-CM

## 2015-05-03 DIAGNOSIS — M79605 Pain in left leg: Secondary | ICD-10-CM | POA: Diagnosis not present

## 2015-05-03 DIAGNOSIS — L97519 Non-pressure chronic ulcer of other part of right foot with unspecified severity: Secondary | ICD-10-CM

## 2015-05-03 DIAGNOSIS — M1611 Unilateral primary osteoarthritis, right hip: Secondary | ICD-10-CM

## 2015-05-03 DIAGNOSIS — E11621 Type 2 diabetes mellitus with foot ulcer: Secondary | ICD-10-CM | POA: Diagnosis not present

## 2015-05-03 DIAGNOSIS — E119 Type 2 diabetes mellitus without complications: Secondary | ICD-10-CM | POA: Diagnosis not present

## 2015-05-03 DIAGNOSIS — M79604 Pain in right leg: Secondary | ICD-10-CM | POA: Insufficient documentation

## 2015-05-03 DIAGNOSIS — M160 Bilateral primary osteoarthritis of hip: Secondary | ICD-10-CM

## 2015-05-03 MED ORDER — TRIAMCINOLONE ACETONIDE 40 MG/ML IJ SUSP
INTRAMUSCULAR | Status: AC
  Filled 2015-05-03: qty 1

## 2015-05-03 MED ORDER — LIDOCAINE HCL (PF) 1 % IJ SOLN: 10.0000 mL | Freq: Once | INTRAMUSCULAR | Status: DC

## 2015-05-03 MED ORDER — BUPIVACAINE HCL (PF) 0.5 % IJ SOLN
INTRAMUSCULAR | Status: AC
  Administered 2015-05-03: 30 mL
  Filled 2015-05-03: qty 30

## 2015-05-03 MED ORDER — TRIAMCINOLONE ACETONIDE 40 MG/ML IJ SUSP: 40.0000 mg | Freq: Once | INTRAMUSCULAR | Status: DC

## 2015-05-03 MED ORDER — CEFAZOLIN SODIUM 1 G IJ SOLR
INTRAMUSCULAR | Status: AC
  Administered 2015-05-03: 1 g via INTRAVENOUS
  Filled 2015-05-03: qty 10

## 2015-05-03 MED ORDER — ORPHENADRINE CITRATE 30 MG/ML IJ SOLN
INTRAMUSCULAR | Status: AC
  Filled 2015-05-03: qty 2

## 2015-05-03 MED ORDER — MIDAZOLAM HCL 5 MG/5ML IJ SOLN
INTRAMUSCULAR | Status: AC
  Administered 2015-05-03: 5 mg via INTRAVENOUS
  Filled 2015-05-03: qty 5

## 2015-05-03 MED ORDER — CEFUROXIME AXETIL 250 MG PO TABS: 250.0000 mg | ORAL_TABLET | Freq: Two times a day (BID) | ORAL | Status: DC

## 2015-05-03 MED ORDER — MIDAZOLAM HCL 5 MG/5ML IJ SOLN
5.0000 mg | Freq: Once | INTRAMUSCULAR | Status: AC
  Administered 2015-05-03: 5 mg via INTRAVENOUS

## 2015-05-03 MED ORDER — FENTANYL CITRATE (PF) 100 MCG/2ML IJ SOLN
INTRAMUSCULAR | Status: AC
  Administered 2015-05-03: 100 ug via INTRAVENOUS
  Filled 2015-05-03: qty 2

## 2015-05-03 MED ORDER — ORPHENADRINE CITRATE 30 MG/ML IJ SOLN: 60.0000 mg | Freq: Once | INTRAMUSCULAR | Status: DC

## 2015-05-03 MED ORDER — CEFAZOLIN SODIUM 1-5 GM-% IV SOLN
1.0000 g | Freq: Once | INTRAVENOUS | Status: DC
Start: 2015-05-03 — End: 2015-07-30

## 2015-05-03 MED ORDER — BUPIVACAINE HCL (PF) 0.5 % IJ SOLN
30.0000 mL | Freq: Once | INTRAMUSCULAR | Status: AC
  Administered 2015-05-03: 30 mL

## 2015-05-03 MED ORDER — FENTANYL CITRATE (PF) 100 MCG/2ML IJ SOLN
100.0000 ug | Freq: Once | INTRAMUSCULAR | Status: AC
  Administered 2015-05-03: 100 ug via INTRAVENOUS

## 2015-05-03 MED ORDER — LACTATED RINGERS IV SOLN: 1000.0000 mL | INTRAVENOUS | Status: DC

## 2015-05-03 NOTE — Progress Notes (Signed)
Subjective:    Patient ID: Amy Meyers, female    DOB: 01-03-1961, 54 y.o.   MRN: [id number]  HPI  PROCEDURE PERFORMED: Lumbar sympathetic block.  HISTORY OF PRESENT ILLNESS: The patient is 54 y.o. female who returns to [facility] for further evaluation and treatment of pain involving the lower extremities. The patient is with history of diabetes mellitus with ulceration of the feet with burning stinging shooting and throbbing pain of the lower extremities There is concern regarding the patient's pain being due to diabetic neuropathy/vasculopathy. Decision made to proceed with lumbar sympathetic block to avoid progression of patient's symptoms and to avoid need for considering amputation of the sequela . The risks, benefits, and expectations of the procedure were discussed and explained to the patient who was understanding and wished to proceed with interventional treatment as planned.   DESCRIPTION OF PROCEDURE: Lumbar sympathetic block with IV Versed, IV fentanyl conscious sedation, EKG, blood pressure, pulse, pulse oximetry monitoring. The procedure was performed with the patient in prone position under fluoroscopic guidance.   NEEDLE PLACEMENT AT L2, right side lumbar sympathetic block: With the patient in prone position and oblique orientation of 20 degrees, Betadine prep and local anesthetic skin wheal of 1.5% lidocaine plain was prepared at the proposed needle entry site. Under fluoroscopic guidance with 20 degrees oblique orientation, the 22 -gauge needle was inserted at the lateral border of the L2 vertebral body on the right side.   NEEDLE PLACEMENT AT L3, right side lumbar sympathetic block: With the patient in the prone position and oblique orientation of 20 degrees, Betadine prep and local anesthetic skin wheal of 1.5% lidocaine plain was prepared at the proposed needle entry site. Under fluoroscopic guidance with 20 degrees  oblique orientation, the 22 -gauge needle was  inserted at the lateral border of the L3 vertebral body on the right side.   NEEDLE PLACEMENT AT L4, right side lumbar sympathetic block: With the patient in prone position and oblique orientation of 20 degrees, Betadine prep and local anesthetic skin wheal of 1.5% lidocaine plain was prepared at the proposed needle entry site. Under fluoroscopic guidance with 20 degrees  oblique orientation, the 22 -gauge needle was inserted at the lateral border of the L4 vertebral body on the right side.    Following needle placement at the L2, L3 and L4 vertebral body levels on the right side, needle placement was then verified on lateral view with tip of the needle documented to be in the anterior third of the vertebral body of L2, L3 and L4 respectively. Following negative aspiration of each needle for heme and CSF, L2 vertebral body level needle was injected with 10 mL of 0.25% bupivacaine. L3 vertebral body level needle was injected with 10 mL of 0.25% bupivacaine. L4 vertebral body level was injected with 10 mL of 0.25% bupivacaine. Needles were removed. Please note temperature readings prior to lumbar sympathetic block were noted to be 90.1 degrees Fahrenheit and following completion of the lumbar sympathetic block temperature readings of the lower extremity were noted to be 90.1 degrees Fahrenheit. The patient tolerated the procedure well.  PLAN:   1. Medications: We will continue presently prescribed medications methadone and oxycodone at this time. 2. The patient is to follow-up with primary care physician Dr. Venia Minks for further evaluation of blood pressure diabetes mellitus and general medical condition as discussed. 3. Surgical evaluation as discussed. May consider. Wound care clinic evaluation as discussed 4. Neurological evaluation as discussed. May consider PNCV  EMG studies 5. The patient may be candidate for radiofrequency procedures, implantation devices, and other treatment pending response to  treatment and follow-up evaluation. 6. The patient has been advised to adhere to proper body mechanics. 7. The patient has been advised to call the Pain Management Center prior to scheduled return appointment should there be significant change in condition or have other concerns regarding condition prior to scheduled return appointment.   The patient was understanding and in agreement with suggested treatment plan.   Review of Systems     Objective:   Physical Exam        Assessment & Plan:

## 2015-05-03 NOTE — Progress Notes (Signed)
Safety precautions to be maintained throughout the outpatient stay will include: orient to surroundings, keep bed in low position, maintain call bell within reach at all times, provide assistance with transfer out of bed and ambulation.  

## 2015-05-03 NOTE — Patient Instructions (Addendum)
Continue present medications methadone and oxycodone and continue amoxicillin as prescribed  F/U PCP Dr. Venia Minks for evaliation of  BP evaluation of diabetes mellitus and evaluation of foot and general medical  condition.  F/U surgical evaluation as discussed  F/U neurological evaluation.  F/U wound care clinic evaluation  May consider radiofrequency rhizolysis or intraspinal procedures pending response to present treatment and F/U evaluation.  Patient to call Pain Management Center should patient have concerns prior to scheduled return appointmentSympathetic Nerve Block, Care After Refer to this sheet in the next few weeks. These instructions provide you with information on caring for yourself after your procedure. Your health care provider may also give you more specific instructions. Your treatment has been planned according to current medical practices, but problems sometimes occur. Call your health care provider if you have any problems or questions after your procedure. WHAT TO EXPECT AFTER THE PROCEDURE After a sympathetic nerve block, it is typical to have the following:  Soreness.  Feeling of warmth.  Weakness.  Numbness. If you had a stellate ganglion block, you may also have:   Voice changes.  A droopy eyelid.  Trouble swallowing.  A stuffy nose. HOME CARE INSTRUCTIONS   Take medicines only as directed by your health care provider.  Rest at home for the first day after your procedure and then you may be able to return to your usual activities.  You may eat what you usually do.  If you have any trouble swallowing, take small bites and small sips of water until you are able to swallow normally.  You may bathe and shower.  Keep all follow-up visits as directed by your health care provider. SEEK MEDICAL CARE IF:  You have numbness that lasts longer than 8 hours.  You continue to have pain for more than 24 hours after your procedure. SEEK IMMEDIATE MEDICAL  CARE IF:  You are unable to swallow.  You have chest pain or trouble breathing.   This information is not intended to replace advice given to you by your health care provider. Make sure you discuss any questions you have with your health care provider.   Document Released: 09/29/2013 Document Reviewed: 09/29/2013 Elsevier Interactive Patient Education 2016 [place]. Pain Management Discharge Instructions  General Discharge Instructions :  If you need to reach your doctor call: Monday-Friday 8:00 am - 4:00 pm at [phone] or toll free [phone].  After clinic hours [phone] to have operator reach doctor.  Bring all of your medication bottles to all your appointments in the pain clinic.  To cancel or reschedule your appointment with Pain Management please remember to call 24 hours in advance to avoid a fee.  Refer to the educational materials which you have been given on: General Risks, I had my Procedure. Discharge Instructions, Post Sedation.  Post Procedure Instructions:  The drugs you were given will stay in your system until tomorrow, so for the next 24 hours you should not drive, make any legal decisions or drink any alcoholic beverages.  You may eat anything you prefer, but it is better to start with liquids then soups and crackers, and gradually work up to solid foods.  Please notify your doctor immediately if you have any unusual bleeding, trouble breathing or pain that is not related to your normal pain.  Depending on the type of procedure that was done, some parts of your body may feel week and/or numb.  This usually clears up by tonight or the next day.  Walk  with the use of an assistive device or accompanied by an adult for the 24 hours.  You may use ice on the affected area for the first 24 hours.  Put ice in a Ziploc bag and cover with a towel and place against area 15 minutes on 15 minutes off.  You may switch to heat after 24 hours.

## 2015-05-04 ENCOUNTER — Telehealth: Payer: Self-pay | Admitting: *Deleted

## 2015-05-04 ENCOUNTER — Ambulatory Visit: Payer: Medicare Other | Admitting: Family Medicine

## 2015-05-04 NOTE — Telephone Encounter (Signed)
Left message with patients to daughter to call if she has any problems or concerns re; procedure.  Daughter states she spoke with her this morning and patient was doing okay.

## 2015-05-10 ENCOUNTER — Ambulatory Visit: Payer: Medicare Other | Admitting: Family Medicine

## 2015-05-11 ENCOUNTER — Ambulatory Visit (INDEPENDENT_AMBULATORY_CARE_PROVIDER_SITE_OTHER): Payer: Medicare Other | Admitting: Family Medicine

## 2015-05-11 ENCOUNTER — Encounter: Payer: Self-pay | Admitting: Family Medicine

## 2015-05-11 VITALS — BP 142/72 | HR 84 | Temp 97.8°F | Resp 16 | Wt 232.0 lb

## 2015-05-11 DIAGNOSIS — L97519 Non-pressure chronic ulcer of other part of right foot with unspecified severity: Secondary | ICD-10-CM | POA: Diagnosis not present

## 2015-05-11 DIAGNOSIS — E11621 Type 2 diabetes mellitus with foot ulcer: Secondary | ICD-10-CM | POA: Diagnosis not present

## 2015-05-11 DIAGNOSIS — M869 Osteomyelitis, unspecified: Secondary | ICD-10-CM | POA: Diagnosis not present

## 2015-05-11 DIAGNOSIS — L98499 Non-pressure chronic ulcer of skin of other sites with unspecified severity: Secondary | ICD-10-CM | POA: Insufficient documentation

## 2015-05-11 DIAGNOSIS — F419 Anxiety disorder, unspecified: Secondary | ICD-10-CM

## 2015-05-11 MED ORDER — PROMETHAZINE HCL 25 MG PO TABS: 25.0000 mg | ORAL_TABLET | Freq: Four times a day (QID) | ORAL | Status: DC | PRN

## 2015-05-11 MED ORDER — LORAZEPAM 1 MG PO TABS: 1.0000 mg | ORAL_TABLET | Freq: Two times a day (BID) | ORAL | Status: DC | PRN

## 2015-05-11 NOTE — Progress Notes (Signed)
Subjective:     Patient ID: Amy Meyers, female   DOB: Jan 28, 1961, 54 y.o.   MRN: [id number]  Chief Complaint  Patient presents with  . Osteomyelitis     HPI  Here for follow-up for subacute osteomyelitis of right foot from 11/8. Was prescribed Augmentin for 4-6 weeks  (04/07/15 CRP 1.3, 04/06/15 ESR 6). Pt is still taking the Augmentin, and was already on 4 days of Augmentin at the last visit for Staph infection. She takes Amox 500 mg x2 BID and Augmentin 875-125 x1 BID. Needs her labs redone. Every other day she skips the second dose due to severe abd pain, nausea side effect. No vomiting, change in bowel movements, changes in urination.  Can tolerate current regiment.   Pt reports the foot ulcer looks better but she still feels it, with severe pain with prolonged walking and standing. R foot pain is 3/10 at baseline, 9/10 with standing, though improved from last visit. Ulcer also breaks open with more walking, but has not opened up recently.   Pt feel better than her  last appointment. No fever, chills, weight loss, activity change, night sweats.   Pt is taking diabetes meds as prescribed and checks her sugars 1-2x/day, average around 150. Does daily foot exams. Has not seen ophthalmologist in "a while", and voices understanding she needs to make an appointment.   Pt was at pain clinic 05/03/15, received lumbar sympathetic block.   Has not seen a podiatrist in a long time. Foot was being taken care of by the pain clinic.   Review of Systems  Constitutional: Negative for activity change, appetite change and unexpected weight change.  HENT: Negative.   Respiratory: Cough: thinks she is getting a cold    Cardiovascular: Negative.   Gastrointestinal: Positive for nausea and abdominal pain (secondary to Augmentin. ). Negative for vomiting, diarrhea, constipation and blood in stool.  Endocrine: Negative.   Genitourinary: Negative.   Musculoskeletal: Positive for gait problem (from foot at times.   ).  Skin: Negative.   Allergic/Immunologic: Negative.     Patient Active Problem List   Diagnosis Date Noted  . Anxiety 04/13/2015  . Arthritis, degenerative 04/06/2015  . Neuropathy ([place]) 04/06/2015  . Diabetic foot ulcer ([place]) 04/06/2015  . Back pain, chronic 04/06/2015  . Abnormal ECG 03/17/2015  . Hypertension 03/17/2015  . NASH (nonalcoholic steatohepatitis) 03/17/2015  . Obesity 03/17/2015  . Palpitations 03/17/2015  . Pulmonary edema 03/17/2015  . PVC's (premature ventricular contractions) 03/17/2015  . Breath shortness 03/17/2015  . Headache, tension-type 03/17/2015  . Compulsive tobacco user syndrome 03/17/2015  . Diabetes mellitus, type 2 ([place]) 03/11/2015  . Type 2 diabetes mellitus with right diabetic foot ulcer ([place]) 01/06/2015  . Facet syndrome, lumbar 11/26/2014  . Sacroiliac joint dysfunction 11/26/2014  . DDD (degenerative disc disease), thoracolumbar 09/29/2014  . Diabetes mellitus due to underlying condition with diabetic neuropathy ([place]) 09/29/2014  . 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 Osteomyelitis of lower leg ([place]) 03/11/2014  . Current tobacco use 12/19/2013  . Systemic inflammatory response syndrome (SIRS) ([place]) 12/16/2013  . Diabetes mellitus ([place]) 12/16/2013  . Arthritis 12/16/2013   Previous Medications   AMOXICILLIN (AMOXIL) 500 MG CAPSULE    Take 2 capsules (1,000 mg total) by mouth 2 (two) times daily.   AMOXICILLIN-CLAVULANATE (AUGMENTIN) 875-125 MG TABLET    Take 1 tablet by mouth 2 (two) times daily.   ASPIRIN 325 MG TABLET    Take 325  mg by mouth daily.   GARLIC 10 MG CAPS    Take by mouth 2 (two) times daily.   INSULIN ASPART (NOVOLOG) 100 UNIT/ML INJECTION    Inject 20 Units into the skin 3 (three) times daily with meals. Sliding scale. If blood sugar between 200-399 administer 20 units, 400 and over use 40 unitsInject into the skin. Sliding scale. If blood sugar between 200-399 administer 20  units, 400 and over use 40 units   INSULIN GLARGINE (LANTUS) 100 UNIT/ML INJECTION    Inject 1 mL (100 Units total) into the skin 2 (two) times daily.   LORAZEPAM (ATIVAN) 1 MG TABLET    Take 1 tablet (1 mg total) by mouth 2 (two) times daily as needed for anxiety.   METHADONE (DOLOPHINE) 10 MG TABLET    Limit 5-7 tabs by mouth daily if tolerated   OMEGA-3 FATTY ACIDS (FISH OIL) 1000 MG CAPS    Take by mouth 2 (two) times daily.   OXYCODONE (OXY IR/ROXICODONE) 5 MG IMMEDIATE RELEASE TABLET    Limit 4 - 6 tabs po / day if tolerated   PROMETHAZINE (PHENERGAN) 25 MG TABLET    Take 25 mg by mouth. 1-2 tabs bid prn   Allergies  Allergen Reactions  . Meloxicam Itching, Swelling and Nausea And Vomiting  . Sulfa Antibiotics Other (See Comments)    Past Surgical History  Procedure Laterality Date  . Carpal tunnel release Bilateral   . Foot surgery Left   . Abdominal adhesion surgery    . Abdominal hysterectomy  1998    with oophorectomy due to adhesions  . Cholecystectomy  1981  . Tonsillectomy  1980  . Joint replacement      right hip replacement  . Cesarean section  1988 & 1989    x 2   . Upper gi x ray series  06/04/2001    Hiatal Hernia   Family History  Problem Relation Age of Onset  . Alcohol abuse Mother   . Arthritis Mother   . COPD Mother   . Depression Mother   . Diabetes Mother   . Alcohol abuse Father   . Depression Father   . Early death Father   . Learning disabilities Father   . Alcohol abuse Brother   . Arthritis Brother   . Diabetes Maternal Grandmother   . Varicose Veins Paternal Grandmother   . Diabetes Maternal Grandfather   . Alcohol abuse Paternal Grandfather    Social History   Social History  . Marital Status: Single    Spouse Name: N/A  . Number of Children: 4  . Years of Education: N/A   Occupational History  . Disabled    Social History Main Topics  . Smoking status: Current Every Day Smoker -- 1.00 packs/day for 44 years    Types:  Cigarettes  . Smokeless tobacco: Not on file  . Alcohol Use: No  . Drug Use: No  . Sexual Activity: Not on file   Other Topics Concern  . Not on file   Social History Narrative  It's difficult for her to get to appointments without a car. Daughter is currently in the ED for severe back pain.      Objective:   Physical Exam  Constitutional: She is oriented to person, place, and time. She appears well-developed and well-nourished. No distress.  Cardiovascular: Normal rate, regular rhythm and normal heart sounds.   Pulmonary/Chest: Effort normal. No respiratory distress.  Congestion at bases bilaterally  Musculoskeletal: Normal  range of motion. She exhibits no edema.  5 mm diameter gray spot between proximal phalanges and first metatarsal on R foot, 2 cm diameter callous on sole of foot where ulcer was,  blanches on light palpation with immediate return of color, pain with light palpation on top  and bottom of right foot around the callous.   Neurological: She is alert and oriented to person, place, and time. No cranial nerve deficit. She exhibits normal muscle tone.  Skin: Skin is warm and dry. She is not diaphoretic. No erythema.  Psychiatric: She has a normal mood and affect.  Vitals reviewed.   BP 142/72 mmHg  Pulse 84  Temp(Src) 97.8 F (36.6 C) (Oral)  Resp 16  Wt 232 lb (105.235 kg)     Assessment:     Diabetic ulcer, possible resolving osteomyelitis.     Plan:     1. Osteomyelitis of lower leg (HCC) Suspect improving. Lesion has not opened back up. Will recheck labs and continue antibiotic for another month.  - C-reactive protein - promethazine (PHENERGAN) 25 MG tablet; Take 1 tablet (25 mg total) by mouth every 6 (six) hours as needed for nausea or vomiting. 1-2 tabs bid prn  Dispense: 30 tablet; Refill: 1  2. Diabetic ulcer of right foot associated with type 2 diabetes mellitus ([place]) Now with callous that needs to be worked on. Will refer to podiatry to evaluate  and treat.   - Ambulatory referral to Podiatry  3. Callous ulcer, with unspecified severity ([place]) New problem.  Will refer for further treatment.  - Ambulatory referral to Podiatry  4. Anxiety Stable. Continue medication. Will refill today.  - LORazepam (ATIVAN) 1 MG tablet; Take 1 tablet (1 mg total) by mouth 2 (two) times daily as needed for anxiety.  Dispense: 60 tablet; Refill: 5   Margarita Rana, MD

## 2015-05-21 ENCOUNTER — Other Ambulatory Visit: Payer: Self-pay

## 2015-05-21 DIAGNOSIS — M869 Osteomyelitis, unspecified: Secondary | ICD-10-CM

## 2015-05-21 DIAGNOSIS — M86271 Subacute osteomyelitis, right ankle and foot: Secondary | ICD-10-CM

## 2015-05-21 MED ORDER — AMOXICILLIN 500 MG PO CAPS: 1000.0000 mg | ORAL_CAPSULE | Freq: Two times a day (BID) | ORAL | Status: DC

## 2015-05-21 MED ORDER — AMOXICILLIN-POT CLAVULANATE 875-125 MG PO TABS: 1.0000 | ORAL_TABLET | Freq: Two times a day (BID) | ORAL | Status: DC

## 2015-05-21 MED ORDER — PROMETHAZINE HCL 25 MG PO TABS: 25.0000 mg | ORAL_TABLET | Freq: Four times a day (QID) | ORAL | Status: DC | PRN

## 2015-05-25 ENCOUNTER — Ambulatory Visit: Payer: Medicare Other | Attending: Pain Medicine | Admitting: Pain Medicine

## 2015-05-25 ENCOUNTER — Encounter: Payer: Self-pay | Admitting: Pain Medicine

## 2015-05-25 ENCOUNTER — Telehealth: Payer: Self-pay | Admitting: Family Medicine

## 2015-05-25 VITALS — BP 130/60 | HR 81 | Temp 98.4°F | Resp 16 | Wt 230.0 lb

## 2015-05-25 DIAGNOSIS — M5136 Other intervertebral disc degeneration, lumbar region: Secondary | ICD-10-CM | POA: Diagnosis not present

## 2015-05-25 DIAGNOSIS — M533 Sacrococcygeal disorders, not elsewhere classified: Secondary | ICD-10-CM

## 2015-05-25 DIAGNOSIS — M545 Low back pain: Secondary | ICD-10-CM | POA: Diagnosis present

## 2015-05-25 DIAGNOSIS — M791 Myalgia: Secondary | ICD-10-CM | POA: Diagnosis not present

## 2015-05-25 DIAGNOSIS — Z96641 Presence of right artificial hip joint: Secondary | ICD-10-CM | POA: Diagnosis not present

## 2015-05-25 DIAGNOSIS — E1140 Type 2 diabetes mellitus with diabetic neuropathy, unspecified: Secondary | ICD-10-CM | POA: Diagnosis not present

## 2015-05-25 DIAGNOSIS — M47816 Spondylosis without myelopathy or radiculopathy, lumbar region: Secondary | ICD-10-CM | POA: Diagnosis not present

## 2015-05-25 DIAGNOSIS — L97519 Non-pressure chronic ulcer of other part of right foot with unspecified severity: Secondary | ICD-10-CM

## 2015-05-25 DIAGNOSIS — M79605 Pain in left leg: Secondary | ICD-10-CM | POA: Diagnosis present

## 2015-05-25 DIAGNOSIS — E0840 Diabetes mellitus due to underlying condition with diabetic neuropathy, unspecified: Secondary | ICD-10-CM

## 2015-05-25 DIAGNOSIS — M5416 Radiculopathy, lumbar region: Secondary | ICD-10-CM | POA: Diagnosis not present

## 2015-05-25 DIAGNOSIS — M79604 Pain in right leg: Secondary | ICD-10-CM | POA: Diagnosis present

## 2015-05-25 DIAGNOSIS — M47817 Spondylosis without myelopathy or radiculopathy, lumbosacral region: Secondary | ICD-10-CM | POA: Diagnosis not present

## 2015-05-25 DIAGNOSIS — M1611 Unilateral primary osteoarthritis, right hip: Secondary | ICD-10-CM

## 2015-05-25 DIAGNOSIS — E11621 Type 2 diabetes mellitus with foot ulcer: Secondary | ICD-10-CM | POA: Insufficient documentation

## 2015-05-25 DIAGNOSIS — M160 Bilateral primary osteoarthritis of hip: Secondary | ICD-10-CM

## 2015-05-25 DIAGNOSIS — M1711 Unilateral primary osteoarthritis, right knee: Secondary | ICD-10-CM | POA: Insufficient documentation

## 2015-05-25 DIAGNOSIS — M5135 Other intervertebral disc degeneration, thoracolumbar region: Secondary | ICD-10-CM

## 2015-05-25 MED ORDER — OXYCODONE HCL 5 MG PO TABS: ORAL_TABLET | ORAL | Status: DC

## 2015-05-25 MED ORDER — METHADONE HCL 10 MG PO TABS: ORAL_TABLET | ORAL | Status: DC

## 2015-05-25 NOTE — Telephone Encounter (Signed)
Pt called states she has lost her lab sheet and is requesting a new one.  CB# [id number]

## 2015-05-25 NOTE — Telephone Encounter (Signed)
Printed new lab order pt advised. sd

## 2015-05-25 NOTE — Progress Notes (Signed)
Safety precautions to be maintained throughout the outpatient stay will include: orient to surroundings, keep bed in low position, maintain call bell within reach at all times, provide assistance with transfer out of bed and ambulation.  

## 2015-05-25 NOTE — Patient Instructions (Addendum)
Continue present medications oxycodone and methadone  F/U PCP Dr. Venia Minks for evaliation of  BP and general medical  Condition.  F/U wound care clinic evaluation as we discussed  Appointment with podiatrist as planned  F/U surgical evaluation  F/U neurological evaluation  May consider radiofrequency rhizolysis or intraspinal procedures pending response to present treatment and F/U evaluation.  Patient to call Pain Management Center should patient have concerns prior to scheduled return appointment

## 2015-05-25 NOTE — Progress Notes (Signed)
   Subjective:    Patient ID: Amy Meyers, female    DOB: 04/11/1961, 54 y.o.   MRN: [id number]  HPI  The patient is a 54 year old female who returns to pain management for further evaluation and treatment of pain involving the lower back and lower extremity region. The patient has pain involving the bottom of the right foot which she attributes to a callus formation. The patient states that the paresthesias of the lower extremity improved significantly following interventional treatment performed in pain management Center. The patient denies any ulcerations of the foot at this time. The patient states that the only pain of the lower extremity is when she walks and she has the sensation of walking on something. Hard. We will await evaluation by podiatrist since patient may need to have the callus trimmed by podiatrist which should improve patient's pain which she experiences when walking. As previously mentioned the patient has had improvement of paresthesias of the lower extremity and is doing very well at this time. We will continue presently prescribed medications. Patient's call pain management should they be change in condition prior to scheduled return appointment.      Review of Systems     Objective:   Physical Exam There was tenderness to palpation of the splenius capitis and occipitalis musculature regions. Palpation of these regions reproduced minimal discomfort. There was minimal tenderness over the cervical facet cervical paraspinal musculature region. Patient appeared to be with bilaterally equal grip strength. Tinel and Phalen's maneuver were without increased pain of significant degree. There was minimal tenderness to palpation over the thoracic facet thoracic paraspinal musculature region with no crepitus of the thoracic region noted. Palpation over the lumbar paraspinal musculature region lumbar facet region was attends to palpation of mild degree with tenderness over the PSIS and  PII S region of moderate degree. There was minimal tenderness of the greater trochanteric region and iliotibial band region. Straight leg raising was tolerates approximately 30 without increased pain with dorsiflexion noted. DTRs were difficult to this patient had difficulty relaxing. There was crepitus of the right knee. No definite sensory deficit or dermatomal distribution detected. There appeared to be decreased sensation of the lower extremities in a stocking-type distribution. There was negative clonus negative Homans. Abdomen nontender with no costovertebral angle tenderness noted.       Assessment & Plan:    Diabetes mellitus with diabetic neuropathy  Diabetic foot ulcer  Degenerative disc disease lumbar spine Multilevel degenerative changes of the lumbar spine with L4-L5 level involvement most significantly  Lumbar facet syndrome  Status post right total hip replacement  Degenerative joint disease right knee     PLAN   Continue present medications oxycodone and methadone  F/U PCP Dr. Venia Minks for evaliation of  BP and general medical  Condition.  F/U wound care clinic evaluation as we discussed  Appointment with podiatrist as planned  F/U surgical evaluation  F/U neurological evaluation  May consider radiofrequency rhizolysis or intraspinal procedures pending response to present treatment and F/U evaluation.  Patient to call Pain Management Center should patient have concerns prior to scheduled return appointment

## 2015-06-22 ENCOUNTER — Encounter: Payer: Self-pay | Admitting: Pain Medicine

## 2015-06-22 ENCOUNTER — Ambulatory Visit: Payer: Medicare Other | Attending: Pain Medicine | Admitting: Pain Medicine

## 2015-06-22 VITALS — BP 138/60 | HR 88 | Temp 98.4°F | Resp 16 | Wt 230.0 lb

## 2015-06-22 DIAGNOSIS — E1140 Type 2 diabetes mellitus with diabetic neuropathy, unspecified: Secondary | ICD-10-CM | POA: Insufficient documentation

## 2015-06-22 DIAGNOSIS — M1611 Unilateral primary osteoarthritis, right hip: Secondary | ICD-10-CM

## 2015-06-22 DIAGNOSIS — M5135 Other intervertebral disc degeneration, thoracolumbar region: Secondary | ICD-10-CM

## 2015-06-22 DIAGNOSIS — E0840 Diabetes mellitus due to underlying condition with diabetic neuropathy, unspecified: Secondary | ICD-10-CM

## 2015-06-22 DIAGNOSIS — M5136 Other intervertebral disc degeneration, lumbar region: Secondary | ICD-10-CM | POA: Insufficient documentation

## 2015-06-22 DIAGNOSIS — M160 Bilateral primary osteoarthritis of hip: Secondary | ICD-10-CM

## 2015-06-22 DIAGNOSIS — M47816 Spondylosis without myelopathy or radiculopathy, lumbar region: Secondary | ICD-10-CM | POA: Diagnosis not present

## 2015-06-22 DIAGNOSIS — E11621 Type 2 diabetes mellitus with foot ulcer: Secondary | ICD-10-CM

## 2015-06-22 DIAGNOSIS — Z96641 Presence of right artificial hip joint: Secondary | ICD-10-CM | POA: Insufficient documentation

## 2015-06-22 DIAGNOSIS — M1711 Unilateral primary osteoarthritis, right knee: Secondary | ICD-10-CM | POA: Diagnosis not present

## 2015-06-22 DIAGNOSIS — M533 Sacrococcygeal disorders, not elsewhere classified: Secondary | ICD-10-CM | POA: Diagnosis not present

## 2015-06-22 DIAGNOSIS — M79606 Pain in leg, unspecified: Secondary | ICD-10-CM | POA: Diagnosis present

## 2015-06-22 DIAGNOSIS — M791 Myalgia: Secondary | ICD-10-CM | POA: Diagnosis not present

## 2015-06-22 DIAGNOSIS — M47817 Spondylosis without myelopathy or radiculopathy, lumbosacral region: Secondary | ICD-10-CM | POA: Diagnosis not present

## 2015-06-22 DIAGNOSIS — M5416 Radiculopathy, lumbar region: Secondary | ICD-10-CM | POA: Diagnosis not present

## 2015-06-22 DIAGNOSIS — M545 Low back pain: Secondary | ICD-10-CM | POA: Diagnosis present

## 2015-06-22 DIAGNOSIS — L97519 Non-pressure chronic ulcer of other part of right foot with unspecified severity: Secondary | ICD-10-CM

## 2015-06-22 IMAGING — US US EXTREM LOW VENOUS*R*
1 series · 14 of 24 positions shown · non-contrast
Comparison: none

REASON FOR EXAM: PAIN THIGH/GROIN
COMMENTS:

PROCEDURE:     US  - US DOPPLER LOW EXTR RIGHT  - December 28, 2012 [DATE]
RESULT:     Technique: Gray scale, Duplex color  flow doppler, and spectral
waveform imaging was performed of the deep venous structures of the RIGHT
lower extremity.

[Series 1: us extrem low venous*right* · 0.10mm/px · 14 of 26 slices shown]
[im 1/26]
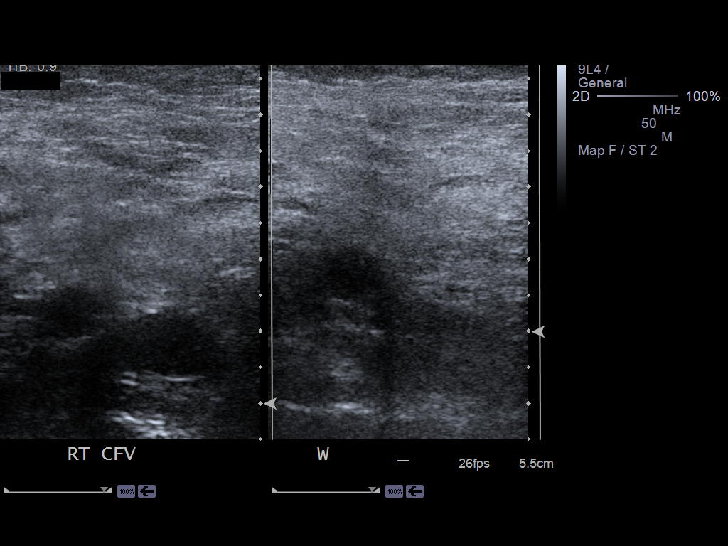
[im 3/26]
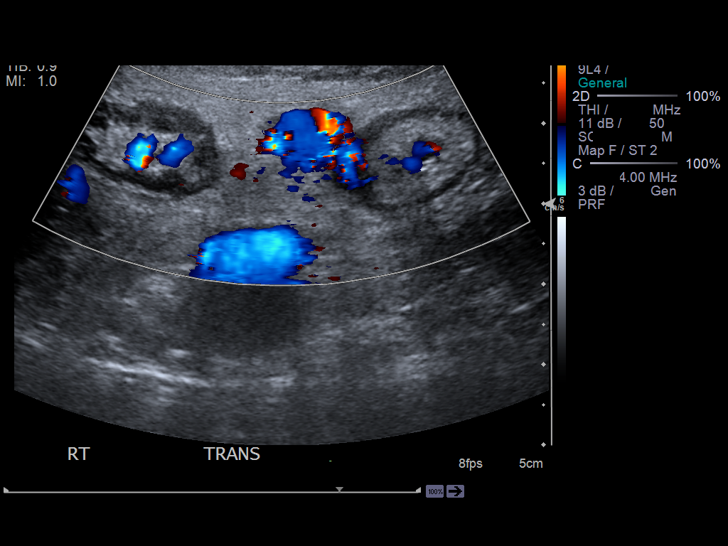
[im 5/26]
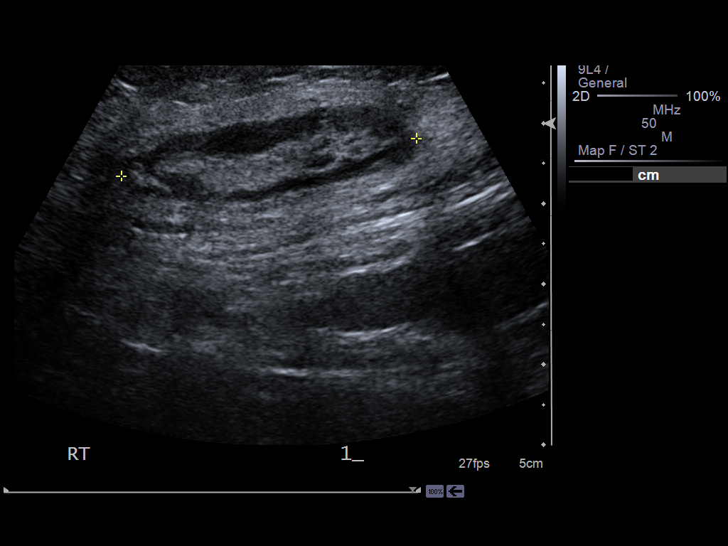
[im 7/26]
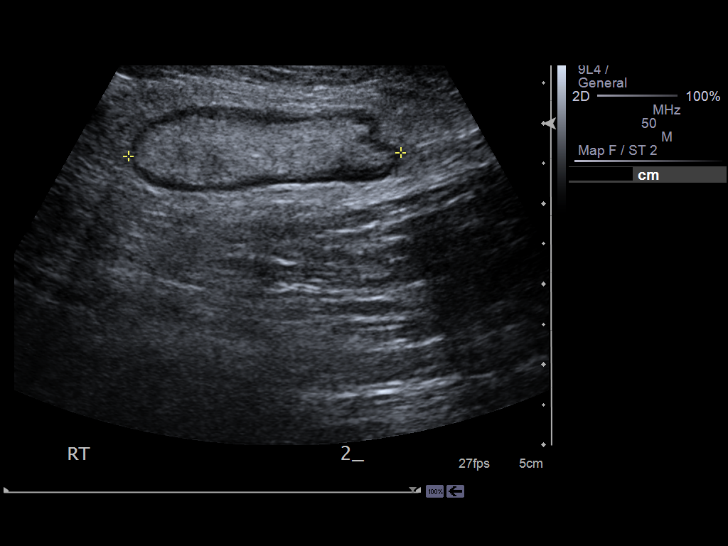
[im 8/26]
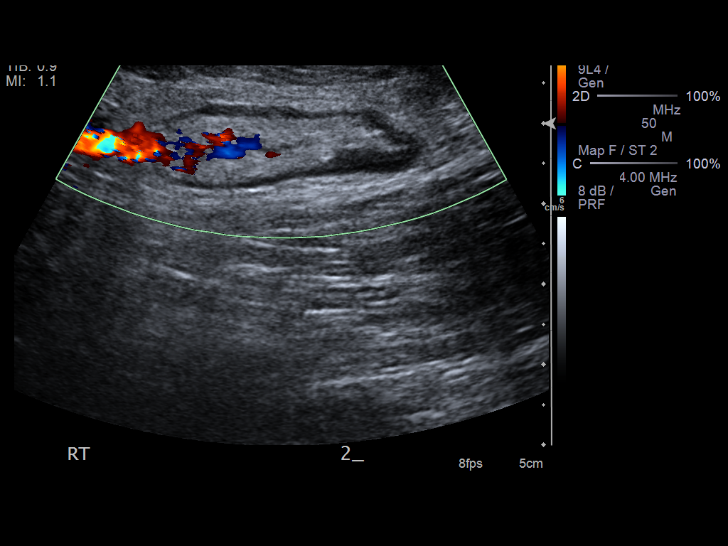
[im 10/26]
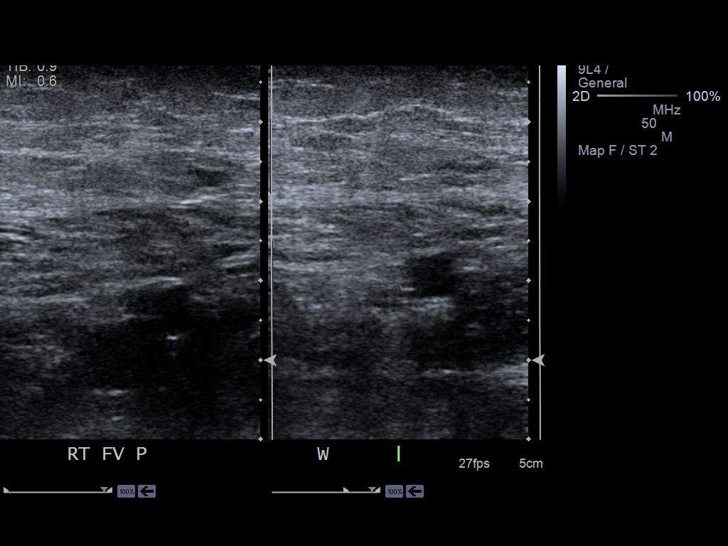
[im 12/26]
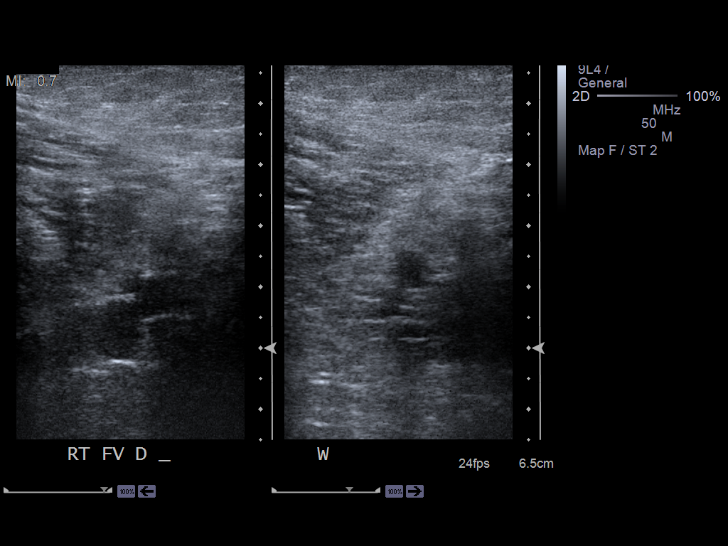
[im 14/26]
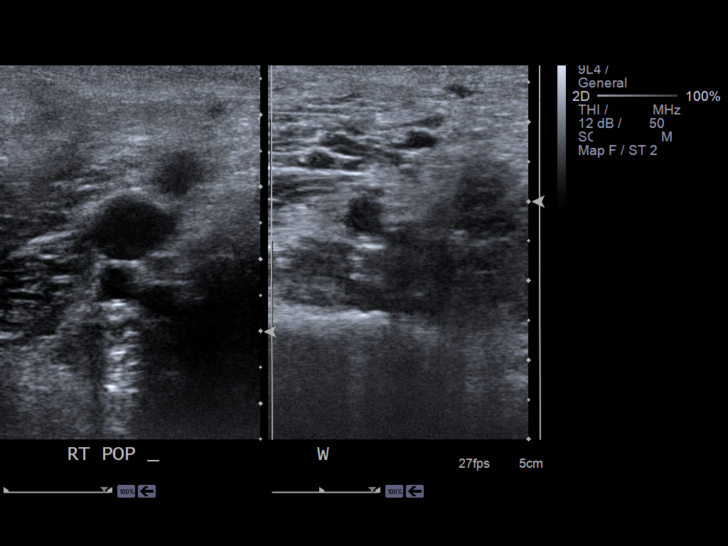
[im 16/26]
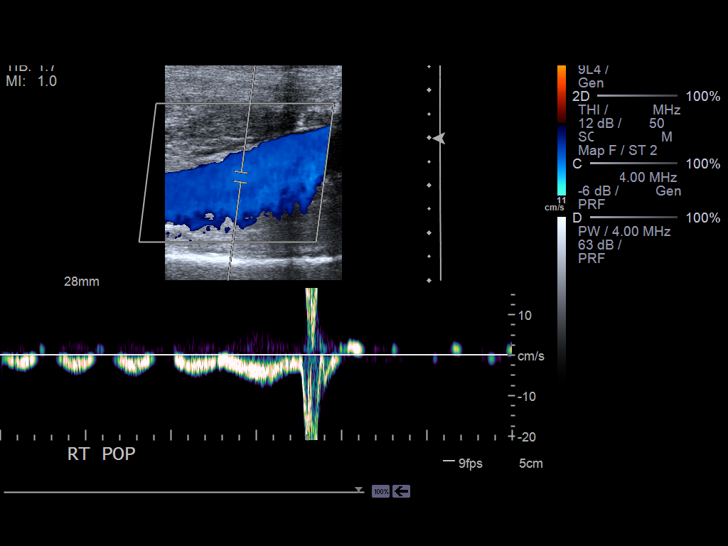
[im 18/26]
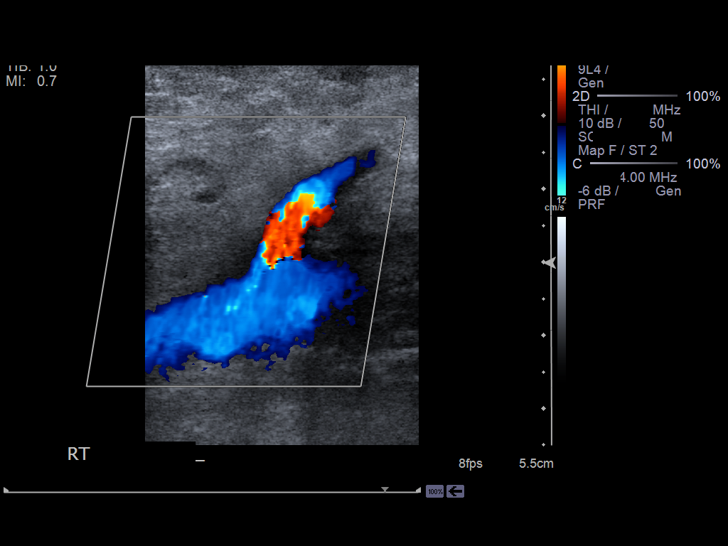
[im 20/26]
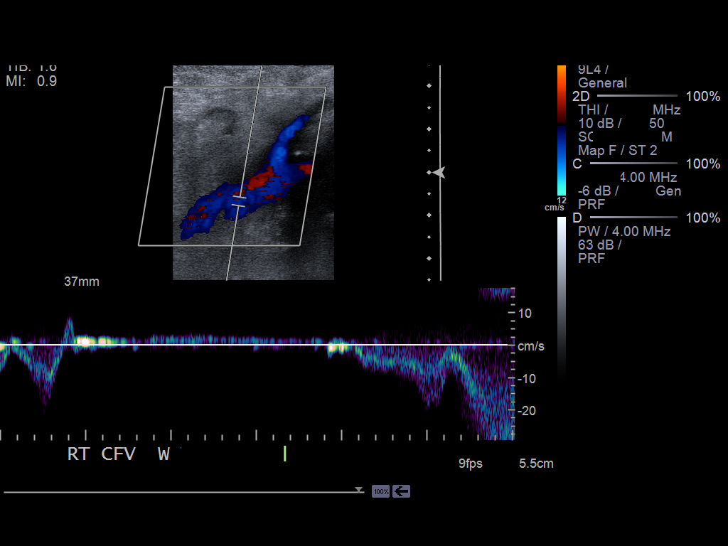
[im 21/26]
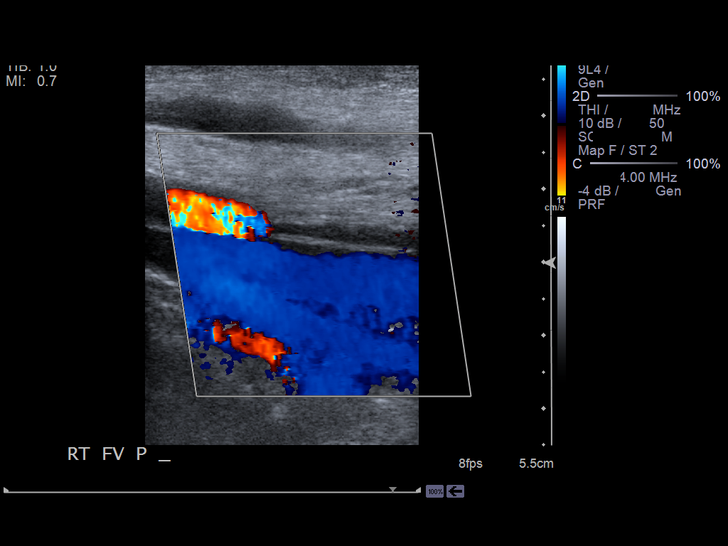
[im 23/26]
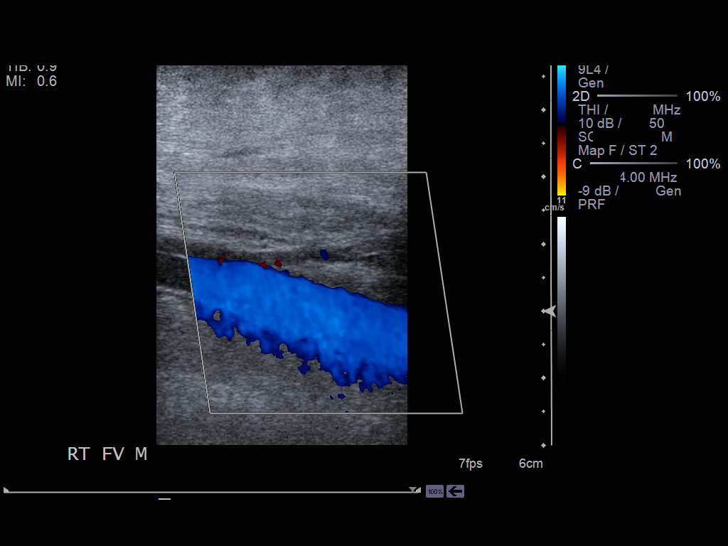
[im 26/26]
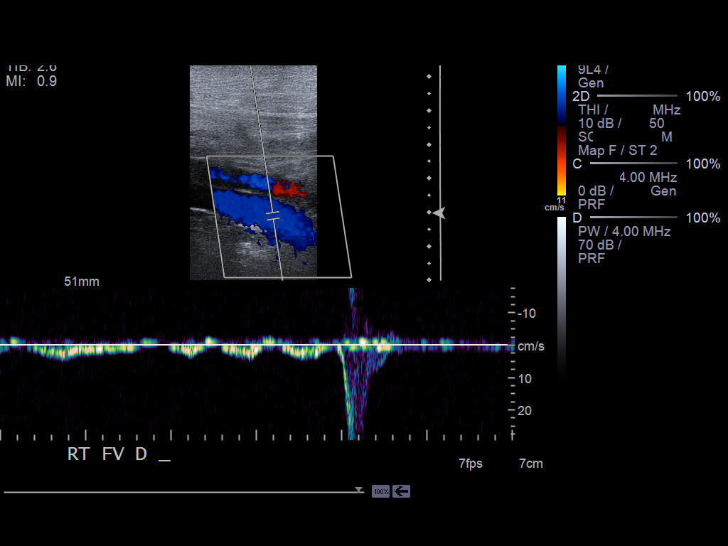

[14 of 24 positions shown; findings below may reference images not displayed]

FINDINGS: There is no evidence of increased echogenicity,
non-compressibility, abnormal waveform, abnormal grayscale, nor abnormal
color flow with in the interrogated deep venous structures of the RIGHT
lower extremity. There is appropriate response to Valsalva and augmentation
within the interrogated vessels.

Enlarged right inguinal lymph nodes are appreciated.
IMPRESSION: 1. No sonographic evidence of a deep venous thrombus within the interrogated
vessels of the RIGHT lower extremity.

## 2015-06-22 MED ORDER — OXYCODONE HCL 5 MG PO TABS: ORAL_TABLET | ORAL | Status: DC

## 2015-06-22 MED ORDER — METHADONE HCL 10 MG PO TABS
ORAL_TABLET | ORAL | Status: DC
Start: 2015-06-22 — End: 2015-07-22

## 2015-06-22 NOTE — Progress Notes (Signed)
Subjective:    Patient ID: Amy Meyers, female    DOB: [birth year], 55 y.o.   MRN: [id number]  HPI The patient is a 55 year old female who returns to pain management for further evaluation and treatment of pain involving the lower back lower extremity region with prior history of total hip replacement on the right and with known degenerative joint disease of the right knee. The patient also is with history of diabetes mellitus and has had reoccurring ulcerations of the feet that have improved and resolved with lumbar sympathetic blocks and other treatment. We discussed patient's condition on today's visit advised patient to follow-up with podiatrist as discussed with the patient stated that her lower back and lower extremity pain was fairly well-controlled on that pain involving the hips and knees was fairly well-controlled as well. The patient complained of pain involving the feet which she attributed to callous formation and will follow-up with podiatrist as planned the patient denied any new lesions of the feet and denied any ulcerations of the feet. The patient will follow-up with Dr. Venia Minks and podiatrist as planned at this time. We will also discussed further orthopedic evaluation of the hip and knees on prior occasions which patient wishes to delay at this time since pain is fairly well-controlled we will continue oxycodone and methadone as prescribed   Review of Systems     Objective:   Physical Exam  There was tenderness to palpation of paraspinal musculature in the cervical region cervical facet region of minimal degree with minimal tenderness to palpation of the splenius capitis and occipitalis musculature regions. The patient was with unremarkable Spurling's maneuver and Tinel and Phalen's maneuver were without increased pain of significant degree. The patient appeared to be with bilaterally equal grip strength. Palpation over the thoracic facet thoracic paraspinal musculature region was  with minimal discomfort and minimal muscle spasms. No crepitus of the thoracic region was noted. Palpation over the lumbar paraspinal musculature region lumbar facet region was with tenderness to palpation of minimal to moderate degree with lateral bending rotation extension and palpation of lumbar facets reproducing minimal discomfort. There was mild tenderness of the greater trochanteric region iliotibial band region. Palpation of the PSIS and PII S region reproduced mild to moderate discomfort. Straight leg raising was tolerated to 30 without increased pain with dorsiflexion noted. No definite sensory deficit or dermatomal distribution detected. There was questionably decreased sensation of the lower extremities in a stocking-type distribution. Knee was without increased warmth and erythema and was with negative anterior and posterior drawer signs without ballottement of the patella. Crepitus of the knee noted. There was negative clonus negative Homans. Abdomen nontender with no costovertebral tenderness noted       Assessment & Plan:     Diabetes mellitus with diabetic neuropathy  Diabetic foot ulcer  Degenerative disc disease lumbar spine Multilevel degenerative changes of the lumbar spine with L4-L5 level involvement most significantly  Lumbar facet syndrome  Status post right total hip replacement  Degenerative joint disease right knee      Plan   Continue present medications oxycodone and methadone  F/U PCP Dr. Venia Minks for evaliation of  BP and general medical  condition.  F/U wound care clinic evaluation as we discussed  Appointment already scheduled with podiatrist as we discussed today  F/U surgical evaluation  F/U neurological evaluation  May consider radiofrequency rhizolysis or intraspinal procedures pending response to present treatment and F/U evaluation.  Patient to call Pain Management Center should patient have  concerns prior to scheduled return  appointment

## 2015-06-22 NOTE — Patient Instructions (Addendum)
Plan   Continue present medications oxycodone and methadone  F/U PCP Dr. Venia Minks for evaliation of  BP and general medical  Condition.  F/U wound care clinic evaluation as we discussed  Appointment already scheduled with podiatrist as we discussed today  F/U surgical evaluation  F/U neurological evaluation  May consider radiofrequency rhizolysis or intraspinal procedures pending response to present treatment and F/U evaluation.  Patient to call Pain Management Center should patient have concerns prior to scheduled return appointment

## 2015-06-22 NOTE — Progress Notes (Signed)
Safety precautions to be maintained throughout the outpatient stay will include: orient to surroundings, keep bed in low position, maintain call bell within reach at all times, provide assistance with transfer out of bed and ambulation.  

## 2015-06-24 ENCOUNTER — Ambulatory Visit: Payer: Medicare Other | Admitting: Pain Medicine

## 2015-06-28 DIAGNOSIS — L97519 Non-pressure chronic ulcer of other part of right foot with unspecified severity: Secondary | ICD-10-CM | POA: Diagnosis not present

## 2015-06-28 DIAGNOSIS — L97522 Non-pressure chronic ulcer of other part of left foot with fat layer exposed: Secondary | ICD-10-CM | POA: Diagnosis not present

## 2015-06-28 DIAGNOSIS — Z794 Long term (current) use of insulin: Secondary | ICD-10-CM | POA: Diagnosis not present

## 2015-06-28 DIAGNOSIS — E08621 Diabetes mellitus due to underlying condition with foot ulcer: Secondary | ICD-10-CM | POA: Diagnosis not present

## 2015-06-28 DIAGNOSIS — E1142 Type 2 diabetes mellitus with diabetic polyneuropathy: Secondary | ICD-10-CM | POA: Diagnosis not present

## 2015-07-05 ENCOUNTER — Other Ambulatory Visit: Payer: Self-pay | Admitting: Pain Medicine

## 2015-07-12 ENCOUNTER — Encounter: Payer: Self-pay | Admitting: Emergency Medicine

## 2015-07-12 DIAGNOSIS — N10 Acute tubulo-interstitial nephritis: Secondary | ICD-10-CM | POA: Insufficient documentation

## 2015-07-12 DIAGNOSIS — E08621 Diabetes mellitus due to underlying condition with foot ulcer: Secondary | ICD-10-CM | POA: Diagnosis not present

## 2015-07-12 DIAGNOSIS — F17210 Nicotine dependence, cigarettes, uncomplicated: Secondary | ICD-10-CM | POA: Insufficient documentation

## 2015-07-12 DIAGNOSIS — L97519 Non-pressure chronic ulcer of other part of right foot with unspecified severity: Secondary | ICD-10-CM | POA: Diagnosis not present

## 2015-07-12 DIAGNOSIS — Z79899 Other long term (current) drug therapy: Secondary | ICD-10-CM | POA: Insufficient documentation

## 2015-07-12 DIAGNOSIS — E0840 Diabetes mellitus due to underlying condition with diabetic neuropathy, unspecified: Secondary | ICD-10-CM | POA: Diagnosis not present

## 2015-07-12 DIAGNOSIS — Z792 Long term (current) use of antibiotics: Secondary | ICD-10-CM | POA: Insufficient documentation

## 2015-07-12 DIAGNOSIS — Z794 Long term (current) use of insulin: Secondary | ICD-10-CM | POA: Diagnosis not present

## 2015-07-12 DIAGNOSIS — I10 Essential (primary) hypertension: Secondary | ICD-10-CM | POA: Insufficient documentation

## 2015-07-12 DIAGNOSIS — N12 Tubulo-interstitial nephritis, not specified as acute or chronic: Secondary | ICD-10-CM | POA: Diagnosis not present

## 2015-07-12 DIAGNOSIS — Z7982 Long term (current) use of aspirin: Secondary | ICD-10-CM | POA: Insufficient documentation

## 2015-07-12 DIAGNOSIS — R509 Fever, unspecified: Secondary | ICD-10-CM | POA: Diagnosis present

## 2015-07-12 NOTE — ED Notes (Addendum)
Pt presents to ED with fever, headache, and urinary pain and frequency. Also reports she has sharp left sided flank pain. Onset last night. reports having nausea but denies vomiting at this time. Advil taken about 2 hours ago.

## 2015-07-13 ENCOUNTER — Emergency Department
Admission: EM | Admit: 2015-07-13 | Discharge: 2015-07-13 | Disposition: A | Discharge status: Home / Self Care | Payer: Medicare Other | Attending: Emergency Medicine | Admitting: Emergency Medicine

## 2015-07-13 DIAGNOSIS — N10 Acute tubulo-interstitial nephritis: Secondary | ICD-10-CM

## 2015-07-13 HISTORY — DX: Unspecified staphylococcus as the cause of diseases classified elsewhere: B95.8

## 2015-07-13 LAB — COMPREHENSIVE METABOLIC PANEL
ALT: 24 U/L (ref 14–54)
ANION GAP: 6 (ref 5–15)
AST: 21 U/L (ref 15–41)
Albumin: 3.5 g/dL (ref 3.5–5.0)
Alkaline Phosphatase: 72 U/L (ref 38–126)
BUN: 16 mg/dL (ref 6–20)
CALCIUM: 8.9 mg/dL (ref 8.9–10.3)
CO2: 35 mmol/L — ABNORMAL HIGH (ref 22–32)
Chloride: 96 mmol/L — ABNORMAL LOW (ref 101–111)
Creatinine, Ser: 0.88 mg/dL (ref 0.44–1.00)
GFR calc non Af Amer: >60 mL/min (ref >60)
GLUCOSE: 141 mg/dL — AB (ref 65–99)
Potassium: 4.8 mmol/L (ref 3.5–5.1)
SODIUM: 137 mmol/L (ref 135–145)
TOTAL PROTEIN: 8 g/dL (ref 6.5–8.1)
Total Bilirubin: 0.8 mg/dL (ref 0.3–1.2)

## 2015-07-13 LAB — URINALYSIS COMPLETE WITH MICROSCOPIC (ARMC ONLY)
BILIRUBIN URINE: NEGATIVE
GLUCOSE, UA: NEGATIVE mg/dL
Ketones, ur: NEGATIVE mg/dL
NITRITE: NEGATIVE
Protein, ur: 100 mg/dL — AB
Specific Gravity, Urine: 1.004 — ABNORMAL LOW (ref 1.005–1.030)
pH: 6 (ref 5.0–8.0)

## 2015-07-13 LAB — CBC
HEMATOCRIT: 45.3 % (ref 35.0–47.0)
Hemoglobin: 14.9 g/dL (ref 12.0–16.0)
MCH: 27.5 pg (ref 26.0–34.0)
MCHC: 32.9 g/dL (ref 32.0–36.0)
MCV: 83.5 fL (ref 80.0–100.0)
Platelets: 123 10*3/uL — ABNORMAL LOW (ref 150–440)
RBC: 5.42 MIL/uL — ABNORMAL HIGH (ref 3.80–5.20)
RDW: 15.9 % — AB (ref 11.5–14.5)
WBC: 13 10*3/uL — AB (ref 3.6–11.0)

## 2015-07-13 MED ORDER — CIPROFLOXACIN HCL 500 MG PO TABS: 500.0000 mg | ORAL_TABLET | Freq: Two times a day (BID) | ORAL | Status: AC

## 2015-07-13 MED ORDER — DEXTROSE 5 % IV SOLN
1.0000 g | Freq: Once | INTRAVENOUS | Status: AC
  Administered 2015-07-13: 1 g via INTRAVENOUS
  Filled 2015-07-13: qty 10

## 2015-07-13 MED ORDER — IBUPROFEN 400 MG PO TABS
600.0000 mg | ORAL_TABLET | Freq: Once | ORAL | Status: AC
  Administered 2015-07-13: 600 mg via ORAL

## 2015-07-13 MED ORDER — IBUPROFEN 600 MG PO TABS
ORAL_TABLET | ORAL | Status: AC
  Administered 2015-07-13: 600 mg via ORAL
  Filled 2015-07-13: qty 1

## 2015-07-13 MED ORDER — CEFTRIAXONE SODIUM 1 G IJ SOLR
INTRAMUSCULAR | Status: AC
  Administered 2015-07-13: 1 g via INTRAVENOUS
  Filled 2015-07-13: qty 10

## 2015-07-13 NOTE — Discharge Instructions (Signed)

## 2015-07-13 NOTE — ED Provider Notes (Signed)
[facility] Emergency Department Provider Note  ____________________________________________  Time seen: 1:45 AM  I have reviewed the triage vital signs and the nursing notes.   HISTORY  Chief Complaint Fever; Urinary Frequency; Dysuria; and Headache      HPI Amy Meyers is a 55 y.o. female presents with fever and dysuria and urinary frequency accompanied by right flank pain and nausea 2 days. Patient also admits to subjective fevers temp chart arrival to emergency department 100.3 that is most taken Advil 2 hours before presentation.     Past Medical History  Diagnosis Date  . Open wound of right foot   . Diabetes mellitus without complication ([place])   . Anxiety   . Arthritis   . Staph infection     right foot    Patient Active Problem List   Diagnosis Date Noted  . Callous ulcer ([place]) 05/11/2015  . Anxiety 04/13/2015  . Arthritis, degenerative 04/06/2015  . Neuropathy ([place]) 04/06/2015  . Diabetic foot ulcer ([place]) 04/06/2015  . Back pain, chronic 04/06/2015  . Abnormal ECG 03/17/2015  . Hypertension 03/17/2015  . NASH (nonalcoholic steatohepatitis) 03/17/2015  . Obesity 03/17/2015  . Palpitations 03/17/2015  . Pulmonary edema 03/17/2015  . PVC's (premature ventricular contractions) 03/17/2015  . Breath shortness 03/17/2015  . Headache, tension-type 03/17/2015  . Compulsive tobacco user syndrome 03/17/2015  . Diabetes mellitus, type 2 ([place]) 03/11/2015  . Type 2 diabetes mellitus with right diabetic foot ulcer ([place]) 01/06/2015  . Facet syndrome, lumbar 11/26/2014  . Sacroiliac joint dysfunction 11/26/2014  . DDD (degenerative disc disease), thoracolumbar 09/29/2014  . Diabetes mellitus due to underlying condition with diabetic neuropathy ([place]) 09/29/2014  . 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 Osteomyelitis of lower leg ([place]) 03/11/2014  . Current tobacco use 12/19/2013  . Systemic  inflammatory response syndrome (SIRS) ([place]) 12/16/2013  . Arthritis 12/16/2013    Past Surgical History  Procedure Laterality Date  . Carpal tunnel release Bilateral   . Foot surgery Left   . Abdominal adhesion surgery    . Abdominal hysterectomy  1998    with oophorectomy due to adhesions  . Cholecystectomy  1981  . Tonsillectomy  1980  . Joint replacement      right hip replacement  . Cesarean section  1988 & 1989    x 2   . Upper gi x ray series  06/04/2001    Hiatal Hernia  . Tonsillectomy      Current Outpatient Rx  Name  Route  Sig  Dispense  Refill  . amoxicillin (AMOXIL) 500 MG capsule   Oral   Take 2 capsules (1,000 mg total) by mouth 2 (two) times daily.   120 capsule   1   . amoxicillin-clavulanate (AUGMENTIN) 875-125 MG tablet   Oral   Take 1 tablet by mouth 2 (two) times daily.   60 tablet   1   . aspirin 325 MG tablet   Oral   Take 325 mg by mouth daily.         . Garlic 10 MG CAPS   Oral   Take by mouth 2 (two) times daily.         . insulin aspart (NOVOLOG) 100 UNIT/ML injection   Subcutaneous   Inject 20 Units into the skin 3 (three) times daily with meals. Sliding scale. If blood sugar between 200-399 administer 20 units, 400 and over use 40 unitsInject into the skin. Sliding scale. If  blood sugar between 200-399 administer 20 units, 400 and over use 40 units   10 mL   3   . insulin glargine (LANTUS) 100 UNIT/ML injection   Subcutaneous   Inject 1 mL (100 Units total) into the skin 2 (two) times daily.   60 mL   3   . LORazepam (ATIVAN) 1 MG tablet   Oral   Take 1 tablet (1 mg total) by mouth 2 (two) times daily as needed for anxiety.   60 tablet   5   . methadone (DOLOPHINE) 10 MG tablet      Limit 5-7 tabs by mouth daily if tolerated   210 tablet   0   . Omega-3 Fatty Acids (FISH OIL) 1000 MG CAPS   Oral   Take by mouth 2 (two) times daily.         Marland Kitchen oxyCODONE (OXY IR/ROXICODONE) 5 MG immediate release tablet       Limit 4 - 6 tabs po / day if tolerated   180 tablet   0   . promethazine (PHENERGAN) 25 MG tablet   Oral   Take 1 tablet (25 mg total) by mouth every 6 (six) hours as needed for nausea or vomiting. 1-2 tabs bid prn   30 tablet   1     Allergies Meloxicam and Sulfa antibiotics  Family History  Problem Relation Age of Onset  . Alcohol abuse Mother   . Arthritis Mother   . COPD Mother   . Depression Mother   . Diabetes Mother   . Alcohol abuse Father   . Depression Father   . Early death Father   . Learning disabilities Father   . Alcohol abuse Brother   . Arthritis Brother   . Diabetes Maternal Grandmother   . Varicose Veins Paternal Grandmother   . Diabetes Maternal Grandfather   . Alcohol abuse Paternal Grandfather     Social History Social History  Substance Use Topics  . Smoking status: Current Every Day Smoker -- 1.00 packs/day for 44 years    Types: Cigarettes  . Smokeless tobacco: Not on file  . Alcohol Use: No    Review of Systems  Constitutional: Positive for fever. Eyes: Negative for visual changes. ENT: Negative for sore throat. Cardiovascular: Negative for chest pain. Respiratory: Negative for shortness of breath. Gastrointestinal: Negative for abdominal pain, vomiting and diarrhea. Genitourinary: Positive for dysuria, urinary frequency and left flank pain Musculoskeletal: Negative for back pain. Skin: Negative for rash. Neurological: Negative for headaches, focal weakness or numbness.   10-point ROS otherwise negative.  ____________________________________________   PHYSICAL EXAM:  VITAL SIGNS: ED Triage Vitals  Enc Vitals Group     BP 07/12/15 2325 149/61 mmHg     Pulse Rate 07/12/15 2325 94     Resp 07/12/15 2325 18     Temp 07/12/15 2325 100.3 F (37.9 C)     Temp Source 07/12/15 2325 Oral     SpO2 07/12/15 2325 96 %     Weight 07/12/15 2325 230 lb (104.327 kg)     Height 07/12/15 2325 5\' 6"  (1.676 m)     Head Cir --      Peak  Flow --      Pain Score 07/12/15 2325 5     Pain Loc --      Pain Edu? --      Excl. in [place]? --      Constitutional: Alert and oriented. Well appearing and in no distress.  Eyes: Conjunctivae are normal. PERRL. Normal extraocular movements. ENT   Head: Normocephalic and atraumatic.   Nose: No congestion/rhinnorhea.   Mouth/Throat: Mucous membranes are moist.   Neck: No stridor. Hematological/Lymphatic/Immunilogical: No cervical lymphadenopathy. Cardiovascular: Normal rate, regular rhythm. Normal and symmetric distal pulses are present in all extremities. No murmurs, rubs, or gallops. Respiratory: Normal respiratory effort without tachypnea nor retractions. Breath sounds are clear and equal bilaterally. No wheezes/rales/rhonchi. Gastrointestinal: Soft and nontender. No distention. Mild right CVA tenderness to percussion Genitourinary: deferred Musculoskeletal: Nontender with normal range of motion in all extremities. No joint effusions.  No lower extremity tenderness nor edema. Neurologic:  Normal speech and language. No gross focal neurologic deficits are appreciated. Speech is normal.  Skin:  Skin is warm, dry and intact. No rash noted. Psychiatric: Mood and affect are normal. Speech and behavior are normal. Patient exhibits appropriate insight and judgment.  ____________________________________________    LABS (pertinent positives/negatives)  Labs Reviewed  COMPREHENSIVE METABOLIC PANEL - Abnormal; Notable for the following:    Chloride 96 (*)    CO2 35 (*)    Glucose, Bld 141 (*)    All other components within normal limits  URINALYSIS COMPLETEWITH MICROSCOPIC ([facility] ONLY) - Abnormal; Notable for the following:    Color, Urine YELLOW (*)    APPearance CLOUDY (*)    Specific Gravity, Urine 1.004 (*)    Hgb urine dipstick 2+ (*)    Protein, ur 100 (*)    Leukocytes, UA 3+ (*)    Bacteria, UA RARE (*)    Squamous Epithelial / LPF 0-5 (*)    All other components  within normal limits  CBC - Abnormal; Notable for the following:    WBC 13.0 (*)    RBC 5.42 (*)    RDW 15.9 (*)    Platelets 123 (*)    All other components within normal limits      INITIAL IMPRESSION / ASSESSMENT AND PLAN / ED COURSE  Pertinent labs & imaging results that were available during my care of the patient were reviewed by me and considered in my medical decision making (see chart for details).  Patient received 1 g ceftriaxone emergency department will be prescribed Cipro for home I caution the patient and her daughter to return to the emergency department immediately if persistent fever or worsening pain or any other emergency medical concerns.  ____________________________________________   FINAL CLINICAL IMPRESSION(S) / ED DIAGNOSES  Final diagnoses:  Acute pyelonephritis      Gregor Hams, MD 07/13/15 [phone]

## 2015-07-15 LAB — URINE CULTURE: Culture: >=100000

## 2015-07-19 DIAGNOSIS — L97519 Non-pressure chronic ulcer of other part of right foot with unspecified severity: Secondary | ICD-10-CM | POA: Diagnosis not present

## 2015-07-19 DIAGNOSIS — L97511 Non-pressure chronic ulcer of other part of right foot limited to breakdown of skin: Secondary | ICD-10-CM | POA: Diagnosis not present

## 2015-07-19 DIAGNOSIS — E08621 Diabetes mellitus due to underlying condition with foot ulcer: Secondary | ICD-10-CM | POA: Diagnosis not present

## 2015-07-22 ENCOUNTER — Encounter: Payer: Self-pay | Admitting: Pain Medicine

## 2015-07-22 ENCOUNTER — Ambulatory Visit: Payer: Medicare Other | Attending: Pain Medicine | Admitting: Pain Medicine

## 2015-07-22 VITALS — BP 143/65 | HR 77 | Temp 98.6°F | Resp 16 | Ht 66.0 in | Wt 233.0 lb

## 2015-07-22 DIAGNOSIS — E11621 Type 2 diabetes mellitus with foot ulcer: Secondary | ICD-10-CM | POA: Diagnosis not present

## 2015-07-22 DIAGNOSIS — Z96641 Presence of right artificial hip joint: Secondary | ICD-10-CM | POA: Insufficient documentation

## 2015-07-22 DIAGNOSIS — E1140 Type 2 diabetes mellitus with diabetic neuropathy, unspecified: Secondary | ICD-10-CM | POA: Insufficient documentation

## 2015-07-22 DIAGNOSIS — M1711 Unilateral primary osteoarthritis, right knee: Secondary | ICD-10-CM | POA: Insufficient documentation

## 2015-07-22 DIAGNOSIS — M533 Sacrococcygeal disorders, not elsewhere classified: Secondary | ICD-10-CM | POA: Diagnosis not present

## 2015-07-22 DIAGNOSIS — M5136 Other intervertebral disc degeneration, lumbar region: Secondary | ICD-10-CM | POA: Diagnosis not present

## 2015-07-22 DIAGNOSIS — M47817 Spondylosis without myelopathy or radiculopathy, lumbosacral region: Secondary | ICD-10-CM | POA: Diagnosis not present

## 2015-07-22 DIAGNOSIS — M5416 Radiculopathy, lumbar region: Secondary | ICD-10-CM | POA: Diagnosis not present

## 2015-07-22 DIAGNOSIS — M79606 Pain in leg, unspecified: Secondary | ICD-10-CM | POA: Diagnosis present

## 2015-07-22 DIAGNOSIS — M1611 Unilateral primary osteoarthritis, right hip: Secondary | ICD-10-CM

## 2015-07-22 DIAGNOSIS — M47816 Spondylosis without myelopathy or radiculopathy, lumbar region: Secondary | ICD-10-CM | POA: Diagnosis not present

## 2015-07-22 DIAGNOSIS — M160 Bilateral primary osteoarthritis of hip: Secondary | ICD-10-CM

## 2015-07-22 DIAGNOSIS — M545 Low back pain: Secondary | ICD-10-CM | POA: Diagnosis present

## 2015-07-22 DIAGNOSIS — M5135 Other intervertebral disc degeneration, thoracolumbar region: Secondary | ICD-10-CM

## 2015-07-22 DIAGNOSIS — M791 Myalgia: Secondary | ICD-10-CM | POA: Diagnosis not present

## 2015-07-22 DIAGNOSIS — E0840 Diabetes mellitus due to underlying condition with diabetic neuropathy, unspecified: Secondary | ICD-10-CM

## 2015-07-22 DIAGNOSIS — L97519 Non-pressure chronic ulcer of other part of right foot with unspecified severity: Secondary | ICD-10-CM

## 2015-07-22 MED ORDER — OXYCODONE HCL 5 MG PO TABS: ORAL_TABLET | ORAL | Status: DC

## 2015-07-22 MED ORDER — METHADONE HCL 10 MG PO TABS: ORAL_TABLET | ORAL | Status: DC

## 2015-07-22 NOTE — Progress Notes (Signed)
Safety precautions to be maintained throughout the outpatient stay will include: orient to surroundings, keep bed in low position, maintain call bell within reach at all times, provide assistance with transfer out of bed and ambulation.  

## 2015-07-22 NOTE — Patient Instructions (Addendum)
Plan   Continue present medications oxycodone and methadone  F/U PCP Dr. Venia Minks for evaliation of  BP UTI diabetes mellitus and general medical  Condition.  F/U wound care clinic evaluation as we discussed  Appointment already scheduled with podiatrist as we discussed today  F/U surgical evaluation  F/U podiatrist as discussed  F/U neurological evaluation  May consider radiofrequency rhizolysis or intraspinal procedures pending response to present treatment and F/U evaluation.  Patient to call Pain Management Center should patient have concerns prior to scheduled return appointment

## 2015-07-22 NOTE — Progress Notes (Signed)
    Subjective:    Patient ID: Amy Meyers, female    DOB: [birth year], 55 y.o.   MRN: [id number]  HPI The patient is a 55 year old female who returns to pain management for further evaluation and treatment of pain involving the lower back and lower extremity region. The patient is status post right total hip replacement and is with known degenerative changes of the right knee. The patient also has pain involving the right foot with prior ulcers of the lower treatment including the foot and with recent evaluation and treatment by podiatrist for callous formation of the right foot. At the present time patient states that her pain is fairly well controlled. We'll continue patient's medication of oxycodone and methadone. We will avoid interventional treatment at this time. The patient is to call pain management should she develop any ulcers of the foot or should she have any other concerns regarding her condition prior to scheduled return appointment. The patient was in agreement with suggested treatment plan   Review of Systems     Objective:   Physical Exam  There was mild tenderness of the splenius capitis and occipitalis musculature regions. Palpation of these regions reproduces minimal discomfort. Palpation over the cervical facet cervical paraspinal musculature region was attends to palpation of mild degree. There appeared to be unremarkable Spurling's maneuver and there was minimal tenderness to palpation of the acromioclavicular and glenohumeral joint regions. The patient appeared to be with bilaterally equal grip strength and Tinel and Phalen's maneuver were without increase of pain of significant degree. Palpation of the thoracic paraspinal muscular treat and thoracic facet region was with minimal tenderness to palpation with no crepitus of the thoracic region noted. Palpation over the lower thoracic paraspinal musculature region was with evidence of muscle spasm of mild-to-moderate degree.  Palpation over the lumbar paraspinal musculatures and lumbar facet region was attends to palpation of moderate degree. There was moderate tenderness of the PSIS and PII S regions as well gluteal and piriformis musculature region with moderate tenderness of the greater trochanteric region and iliotibial band region. The right knee was attends to palpation of mild to moderate degree with negative anterior and posterior drawer signs without ballottement of the patella. EHL strength appeared to be slightly decreased. There was question decreased sensation of the lower extremities in a stocking-type distribution with no sensory deficit or dermatomal distribution detected. There appeared to be negative clonus negative Homans. DTRs were difficult to this patient had difficulty relaxing. Abdomen was nontender with no costovertebral tenderness noted.    Assessment & Plan:   Diabetes mellitus with diabetic neuropathy  Diabetic foot ulcer  Degenerative disc disease lumbar spine Multilevel degenerative changes of the lumbar spine with L4-L5 level involvement most significantly  Lumbar facet syndrome  Status post right total hip replacement  Degenerative joint disease right knee    Plan   Continue present medications oxycodone and methadone  F/U PCP Dr. Venia Minks for evaliation of  BP UTI diabetes mellitus and general medical  Condition.  F/U wound care clinic evaluation as we discussed  Appointment already scheduled with podiatrist as we discussed today  F/U surgical evaluation  F/U podiatrist as discussed  F/U neurological evaluation  May consider radiofrequency rhizolysis or intraspinal procedures pending response to present treatment and F/U evaluation.  Patient to call Pain Management Center should patient have concerns prior to scheduled return appointment

## 2015-07-30 ENCOUNTER — Ambulatory Visit (INDEPENDENT_AMBULATORY_CARE_PROVIDER_SITE_OTHER): Payer: Medicare Other | Admitting: Physician Assistant

## 2015-07-30 ENCOUNTER — Encounter: Payer: Self-pay | Admitting: Physician Assistant

## 2015-07-30 ENCOUNTER — Other Ambulatory Visit: Payer: Self-pay

## 2015-07-30 VITALS — BP 160/60 | HR 105 | Temp 99.2°F | Resp 16 | Wt 238.2 lb

## 2015-07-30 DIAGNOSIS — R509 Fever, unspecified: Secondary | ICD-10-CM

## 2015-07-30 DIAGNOSIS — N390 Urinary tract infection, site not specified: Secondary | ICD-10-CM

## 2015-07-30 DIAGNOSIS — E0840 Diabetes mellitus due to underlying condition with diabetic neuropathy, unspecified: Secondary | ICD-10-CM

## 2015-07-30 DIAGNOSIS — Z794 Long term (current) use of insulin: Principal | ICD-10-CM

## 2015-07-30 DIAGNOSIS — R52 Pain, unspecified: Secondary | ICD-10-CM

## 2015-07-30 LAB — POCT URINALYSIS DIPSTICK
BILIRUBIN UA: NEGATIVE
Glucose, UA: NEGATIVE
Ketones, UA: NEGATIVE
LEUKOCYTES UA: NEGATIVE
NITRITE UA: NEGATIVE
PH UA: 6.5
Protein, UA: 100
Spec Grav, UA: 1.02
Urobilinogen, UA: 0.2

## 2015-07-30 MED ORDER — INSULIN GLARGINE 100 UNIT/ML ~~LOC~~ SOLN: 100.0000 [IU] | Freq: Two times a day (BID) | SUBCUTANEOUS | Status: DC

## 2015-07-30 MED ORDER — CIPROFLOXACIN HCL 500 MG PO TABS: 500.0000 mg | ORAL_TABLET | Freq: Two times a day (BID) | ORAL | Status: DC

## 2015-07-30 NOTE — Patient Instructions (Signed)

## 2015-07-30 NOTE — Telephone Encounter (Signed)
Pharmacy requesting refill. Patient was last seen today. However, she was seen by Dr. Venia Minks in 04/2015. Med last written on 03/2015.

## 2015-07-30 NOTE — Progress Notes (Signed)
[name] Female    DOB: 21-Mar-1961   55 y.o.   MRN: [id number] Visit Date: 07/30/2015  Today's Provider: Mar Daring, [name]   Chief Complaint  Patient presents with  . Urinary Tract Infection   Subjective:    Urinary Tract Infection  This is a new problem. Episode onset: Per patient went to the hospital 2 weeks ago and was diagnosed with a kidney infection. The problem has been gradually worsening. Quality: just the frequency to go to the bathroom. The maximum temperature recorded prior to her arrival was 102 - 102.9 F (102.5 an hour ago and took Tylenol). Associated symptoms include chills, flank pain (Back hurts a lot), frequency, nausea and urgency. Pertinent negatives include no discharge, hematuria or vomiting. Associated symptoms comments: Body ache.. She has tried antibiotics (Finished antibiotic three days ago for a kidney infection. ) for the symptoms. The treatment provided mild relief.       Allergies  Allergen Reactions  . Meloxicam Itching, Swelling and Nausea And Vomiting  . Sulfa Antibiotics Other (See Comments)   Previous Medications   AMOXICILLIN (AMOXIL) 500 MG CAPSULE    Take 2 capsules (1,000 mg total) by mouth 2 (two) times daily.   AMOXICILLIN-CLAVULANATE (AUGMENTIN) 875-125 MG TABLET    Take 1 tablet by mouth 2 (two) times daily.   ASPIRIN 325 MG TABLET    Take 325 mg by mouth daily. Reported on [id number]   GARLIC 10 MG CAPS    Take by mouth 2 (two) times daily. Reported on 07/30/2015   INSULIN ASPART (NOVOLOG) 100 UNIT/ML INJECTION    Inject 20 Units into the skin 3 (three) times daily with meals. Sliding scale. If blood sugar between 200-399 administer 20 units, 400 and over use 40 unitsInject into the skin. Sliding scale. If blood sugar between 200-399 administer 20 units, 400 and over use 40 units   INSULIN GLARGINE (LANTUS) 100 UNIT/ML INJECTION    Inject 1 mL (100 Units total) into the skin 2 (two) times daily.   LORAZEPAM (ATIVAN) 1 MG  TABLET    Take 1 tablet (1 mg total) by mouth 2 (two) times daily as needed for anxiety.   METHADONE (DOLOPHINE) 10 MG TABLET    Limit 5-7 tabs by mouth daily if tolerated   OMEGA-3 FATTY ACIDS (FISH OIL) 1000 MG CAPS    Take by mouth 2 (two) times daily.   OXYCODONE (OXY IR/ROXICODONE) 5 MG IMMEDIATE RELEASE TABLET    Limit 4 - 6 tabs po / day for breakthrough pain while taking methadone if tolerated   PROMETHAZINE (PHENERGAN) 25 MG TABLET    Take 1 tablet (25 mg total) by mouth every 6 (six) hours as needed for nausea or vomiting. 1-2 tabs bid prn    Review of Systems  Constitutional: Positive for fever (has been off and on. Has not run a fever for the past three days until today.) and chills.  HENT: Positive for congestion, rhinorrhea, sinus pressure and sneezing. Negative for ear pain, postnasal drip and sore throat.   Respiratory: Positive for cough and wheezing (a little). Negative for chest tightness.   Cardiovascular: Negative for chest pain, palpitations and leg swelling.  Gastrointestinal: Positive for nausea. Negative for vomiting, abdominal pain, diarrhea and constipation.  Genitourinary: Positive for urgency, frequency and flank pain (Back hurts a lot). Negative for dysuria, hematuria, decreased urine volume, vaginal bleeding, vaginal discharge and vaginal pain.  Musculoskeletal: Positive for back pain.  Neurological: Positive for dizziness and headaches.    Social History  Substance Use Topics  . Smoking status: Current Every Day Smoker -- 1.00 packs/day for 44 years    Types: Cigarettes  . Smokeless tobacco: Not on file  . Alcohol Use: No   Objective:   BP 160/60 mmHg  Pulse 105  Temp(Src) 99.2 F (37.3 C) (Oral)  Resp 16  Wt 238 lb 3.2 oz (108.047 kg)  SpO2 96%  Physical Exam  Constitutional: She is oriented to person, place, and time. She appears well-developed and well-nourished. No distress.  HENT:  Head: Normocephalic and atraumatic.  Right Ear: Hearing,  tympanic membrane, external ear and ear canal normal.  Left Ear: Hearing, tympanic membrane, external ear and ear canal normal.  Nose: Nose normal. Right sinus exhibits no maxillary sinus tenderness and no frontal sinus tenderness. Left sinus exhibits no maxillary sinus tenderness and no frontal sinus tenderness.  Mouth/Throat: Uvula is midline, oropharynx is clear and moist and mucous membranes are normal. No oropharyngeal exudate, posterior oropharyngeal edema or posterior oropharyngeal erythema.  Eyes: Conjunctivae are normal. Pupils are equal, round, and reactive to light. Right eye exhibits no discharge. Left eye exhibits no discharge. No scleral icterus.  Neck: Normal range of motion. Neck supple. No tracheal deviation present. No thyromegaly present.  Cardiovascular: Normal rate, regular rhythm and normal heart sounds.  Exam reveals no gallop and no friction rub.   No murmur heard. Pulmonary/Chest: Effort normal and breath sounds normal. No stridor. No respiratory distress. She has no wheezes. She has no rales.  Abdominal: Soft. Normal appearance and bowel sounds are normal. She exhibits no distension and no mass. There is no hepatosplenomegaly. There is tenderness in the suprapubic area. There is no rebound, no guarding and no CVA tenderness.  Lymphadenopathy:    She has no cervical adenopathy.  Neurological: She is alert and oriented to person, place, and time.  Skin: Skin is warm and dry. She is not diaphoretic.  Vitals reviewed.       Assessment & Plan:     1. Urinary tract infection without hematuria, site unspecified UA was negative for nitrites and leukocytes but did have small amount of hematuria and protein.  Will treat empirically with cipro as below for symptoms as it is possible she may not have completely cleared the pyelonephritis she was diagnosed with less than 2 weeks ago. Will send urine for culture and adjust antibiotic therapy pending c&s results. She is to call the  office if symptoms worsen or fail to improve. - POCT urinalysis dipstick - Urine culture - ciprofloxacin (CIPRO) 500 MG tablet; Take 1 tablet (500 mg total) by mouth 2 (two) times daily.  Dispense: 20 tablet; Refill: 0  2. Fever, unspecified fever cause Flu negative in office.  See above medical treatment plan. - POCT Influenza A/B - ciprofloxacin (CIPRO) 500 MG tablet; Take 1 tablet (500 mg total) by mouth 2 (two) times daily.  Dispense: 20 tablet; Refill: 0  3. Body aches See above medical treatment plan for #2. - POCT Influenza A/B - ciprofloxacin (CIPRO) 500 MG tablet; Take 1 tablet (500 mg total) by mouth 2 (two) times daily.  Dispense: 20 tablet; Refill: 0       Mar Daring, [name]  [facility]

## 2015-08-01 LAB — PLEASE NOTE

## 2015-08-01 LAB — URINE CULTURE: Organism ID, Bacteria: NO GROWTH

## 2015-08-02 ENCOUNTER — Ambulatory Visit: Payer: Medicare Other | Admitting: Family Medicine

## 2015-08-02 ENCOUNTER — Telehealth: Payer: Self-pay

## 2015-08-02 NOTE — Telephone Encounter (Signed)
-----   Message from Mar Daring, [name] sent at 08/01/2015  6:26 PM EST ----- Urine culture was negative. Can we see how she is feeling also. Thanks!

## 2015-08-02 NOTE — Telephone Encounter (Signed)
Patient advised as directed below. Per patient she is okay. Her back and right side by the kidney area still hurts and the frequency and urgency to go to the bathroom still the same.  Thanks,  -Christop Hippert

## 2015-08-02 NOTE — Telephone Encounter (Signed)
Ok thanks. I do question if this may be a kidney stone.  I do not see that she has a history of this.  If symptoms continue may consider imaging to rule this out.

## 2015-08-02 NOTE — Telephone Encounter (Signed)
Patient she will call back if she is not doing any better.  Thanks,  -Colvin Blatt

## 2015-08-19 ENCOUNTER — Ambulatory Visit: Payer: Medicare Other | Attending: Pain Medicine | Admitting: Pain Medicine

## 2015-08-19 ENCOUNTER — Encounter: Payer: Self-pay | Admitting: Pain Medicine

## 2015-08-19 VITALS — BP 128/65 | HR 83 | Temp 97.9°F | Resp 16 | Ht 66.0 in | Wt 230.0 lb

## 2015-08-19 DIAGNOSIS — Z96641 Presence of right artificial hip joint: Secondary | ICD-10-CM | POA: Insufficient documentation

## 2015-08-19 DIAGNOSIS — M62830 Muscle spasm of back: Secondary | ICD-10-CM | POA: Insufficient documentation

## 2015-08-19 DIAGNOSIS — M160 Bilateral primary osteoarthritis of hip: Secondary | ICD-10-CM

## 2015-08-19 DIAGNOSIS — M5135 Other intervertebral disc degeneration, thoracolumbar region: Secondary | ICD-10-CM

## 2015-08-19 DIAGNOSIS — M5416 Radiculopathy, lumbar region: Secondary | ICD-10-CM | POA: Diagnosis not present

## 2015-08-19 DIAGNOSIS — L97519 Non-pressure chronic ulcer of other part of right foot with unspecified severity: Secondary | ICD-10-CM

## 2015-08-19 DIAGNOSIS — M533 Sacrococcygeal disorders, not elsewhere classified: Secondary | ICD-10-CM

## 2015-08-19 DIAGNOSIS — M545 Low back pain: Secondary | ICD-10-CM | POA: Diagnosis present

## 2015-08-19 DIAGNOSIS — E13621 Other specified diabetes mellitus with foot ulcer: Secondary | ICD-10-CM | POA: Insufficient documentation

## 2015-08-19 DIAGNOSIS — M5136 Other intervertebral disc degeneration, lumbar region: Secondary | ICD-10-CM | POA: Insufficient documentation

## 2015-08-19 DIAGNOSIS — E1140 Type 2 diabetes mellitus with diabetic neuropathy, unspecified: Secondary | ICD-10-CM | POA: Diagnosis not present

## 2015-08-19 DIAGNOSIS — M791 Myalgia: Secondary | ICD-10-CM | POA: Diagnosis not present

## 2015-08-19 DIAGNOSIS — M47817 Spondylosis without myelopathy or radiculopathy, lumbosacral region: Secondary | ICD-10-CM | POA: Diagnosis not present

## 2015-08-19 DIAGNOSIS — E11621 Type 2 diabetes mellitus with foot ulcer: Secondary | ICD-10-CM

## 2015-08-19 DIAGNOSIS — M1711 Unilateral primary osteoarthritis, right knee: Secondary | ICD-10-CM | POA: Insufficient documentation

## 2015-08-19 DIAGNOSIS — E0840 Diabetes mellitus due to underlying condition with diabetic neuropathy, unspecified: Secondary | ICD-10-CM

## 2015-08-19 DIAGNOSIS — M1611 Unilateral primary osteoarthritis, right hip: Secondary | ICD-10-CM

## 2015-08-19 DIAGNOSIS — M47816 Spondylosis without myelopathy or radiculopathy, lumbar region: Secondary | ICD-10-CM | POA: Diagnosis not present

## 2015-08-19 MED ORDER — OXYCODONE HCL 5 MG PO TABS: ORAL_TABLET | ORAL | Status: DC

## 2015-08-19 MED ORDER — METHADONE HCL 10 MG PO TABS: ORAL_TABLET | ORAL | Status: DC

## 2015-08-19 NOTE — Patient Instructions (Addendum)
Plan   Continue present medications oxycodone and methadone  F/U PCP Dr. Venia Minks for evaliation of  BP  diabetes mellitus and general medical  condition.  F/U wound care clinic evaluation   Appointment already scheduled with podiatrist as we discussed today  F/U surgical evaluation. Patient without plans for surgical intervention at this time  F/U podiatrist as discussed  F/U neurological evaluation. May consider  May consider radiofrequency rhizolysis or intraspinal procedures pending response to present treatment and F/U evaluation.. We will avoid such treatment at this time  Patient to call Pain Management Center should patient have concerns prior to scheduled return appointment

## 2015-08-19 NOTE — Progress Notes (Signed)
Safety precautions to be maintained throughout the outpatient stay will include: orient to surroundings, keep bed in low position, maintain call bell within reach at all times, provide assistance with transfer out of bed and ambulation.  

## 2015-08-19 NOTE — Progress Notes (Signed)
Subjective:    Patient ID: Amy Meyers, female    DOB: 1961/05/24, 55 y.o.   MRN: [id number]  HPI    The patient is a 55 year old female who returns to pain management for further evaluation and treatment of pain involving the lower back lower extremity region especially the feet. The patient is with history of pain involving the hip with prior total hip replacement on the right and is with known degenerative joint disease of the right knee greater than the left knee. The patient will continue undergo treatment by podiatrist at this time. 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 At the present time patient is without ulceration of the feet due to diabetic neuropathy vasculopathy. The patient states that she is benefiting from the treatment by podiatrist and states that he has been trimming callous formation of the foot. We remain available to consider patient for modification of treatment regimen including interventional treatment should there be change in patient's condition. The patient will continue medications as prescribed at this time consisting of methadone and oxycodone. We will remain available to modified treatment regimen pending response to treatment and follow-up evaluation. The patient denies any trauma change in events of daily living the call significant change in symptomatology.    Review of Systems     Objective:   Physical Exam   There was tenderness to palpation of the splenius capitis and occipitalis musculature region of minimal degree. There was minimal tenderness of the cervical facet cervical paraspinal musculature region. Palpation of the acromioclavicular and glenohumeral joint regions were with minimal tenderness to palpation. There was tenderness over the region of the thoracic facet thoracic paraspinal muscles regional mental degree. The  patient appeared to be with unremarkable Spurling's maneuver. Range of motion maneuvers of the cervical spine with without radiation of pain toward the upper extremities. The patient appeared to be with bilaterally equal grip strength and Tinel and Phalen's maneuver without increase of pain of significant degree. Palpation over the lower thoracic region was with evidence of muscle spasm of moderate degree. No crepitus of the thoracic region was noted. Palpation over the lumbar paraspinal must reason lumbar facet region was attends to palpation of moderate degree right greater than left with tenderness over the PSIS and PII S region a moderate degree. There was moderate tenderness along the greater trochanteric region and iliotibial band region. EHL strength appeared to be slightly decreased. No definite sensory deficit or dermatomal distribution of the lower extremities noted. There was crepitus of the knees noted with negative anterior and posterior drawer signs without ballottement of the patella. No increased warmth erythema was noted in the region of the knees. DTRs were difficult to elicit. No definite sensory deficit or dermatomal distribution of the lower extremities noted. There was negative clonus negative Homans. Abdomen nontender with no costovertebral tenderness noted.     Assessment & Plan:       Diabetes mellitus with diabetic neuropathy  Diabetic foot ulcer  Degenerative disc disease lumbar spine Multilevel degenerative changes of the lumbar spine with L4-L5 level involvement most significantly  Lumbar facet syndrome  Status post right total hip replacement  Degenerative joint disease right knee     Plan   Continue present medications oxycodone and methadone  F/U PCP Dr. Venia Minks for evaliation of  BP  diabetes mellitus and general medical  condition.  F/U wound care clinic  evaluation   Appointment already scheduled with podiatrist as we discussed today  F/U surgical  evaluation. Patient without plans for surgical intervention at this time  F/U podiatrist as discussed  F/U neurological evaluation. May consider  May consider radiofrequency rhizolysis or intraspinal procedures pending response to present treatment and F/U evaluation.. We will avoid such treatment at this time  Patient to call [facility] should patient have concerns prior to scheduled return appointment

## 2015-08-26 ENCOUNTER — Ambulatory Visit (INDEPENDENT_AMBULATORY_CARE_PROVIDER_SITE_OTHER): Payer: Medicare Other | Admitting: Physician Assistant

## 2015-08-26 ENCOUNTER — Encounter: Payer: Self-pay | Admitting: Physician Assistant

## 2015-08-26 VITALS — BP 160/80 | HR 88 | Temp 97.6°F | Resp 16 | Wt 235.4 lb

## 2015-08-26 DIAGNOSIS — R05 Cough: Secondary | ICD-10-CM | POA: Diagnosis not present

## 2015-08-26 DIAGNOSIS — J029 Acute pharyngitis, unspecified: Secondary | ICD-10-CM

## 2015-08-26 DIAGNOSIS — R110 Nausea: Secondary | ICD-10-CM | POA: Diagnosis not present

## 2015-08-26 DIAGNOSIS — B9689 Other specified bacterial agents as the cause of diseases classified elsewhere: Secondary | ICD-10-CM

## 2015-08-26 DIAGNOSIS — R059 Cough, unspecified: Secondary | ICD-10-CM

## 2015-08-26 DIAGNOSIS — J028 Acute pharyngitis due to other specified organisms: Principal | ICD-10-CM

## 2015-08-26 LAB — POCT RAPID STREP A (OFFICE): Rapid Strep A Screen: NEGATIVE

## 2015-08-26 MED ORDER — HYDROCODONE-HOMATROPINE 5-1.5 MG/5ML PO SYRP: 5.0000 mL | ORAL_SOLUTION | Freq: Three times a day (TID) | ORAL | Status: DC | PRN

## 2015-08-26 MED ORDER — PROMETHAZINE HCL 25 MG PO TABS
25.0000 mg | ORAL_TABLET | Freq: Four times a day (QID) | ORAL | Status: DC | PRN
Start: 2015-08-26 — End: 2015-11-13

## 2015-08-26 MED ORDER — PROMETHAZINE HCL 25 MG PO TABS: 25.0000 mg | ORAL_TABLET | Freq: Four times a day (QID) | ORAL | Status: DC | PRN

## 2015-08-26 MED ORDER — AMOXICILLIN 875 MG PO TABS: 875.0000 mg | ORAL_TABLET | Freq: Two times a day (BID) | ORAL | Status: DC

## 2015-08-26 NOTE — Progress Notes (Signed)
[name] Female    DOB: 1961-03-31   55 y.o.   MRN: [id number] Visit Date: 08/26/2015  Today's Provider: Mar Daring, [name]   Chief Complaint  Patient presents with  . Fever   Subjective:    Fever  This is a new problem. The current episode started in the past 7 days (Today is the third day). The problem has been gradually worsening (Cough). The maximum temperature noted was 100 to 100.9 F (100.5 was the highest.). The temperature was taken using an oral thermometer. Associated symptoms include congestion, coughing, headaches, nausea, a sore throat (White patches in the back of her throat.notice the patches yesterday on the left side of her throat.) and wheezing. Pertinent negatives include no chest pain, diarrhea, ear pain, muscle aches, rash or vomiting. She has tried fluids (Ibuprofen) for the symptoms. The treatment provided no relief.       Allergies  Allergen Reactions  . Meloxicam Itching, Swelling and Nausea And Vomiting  . Sulfa Antibiotics Other (See Comments)   Previous Medications   ASPIRIN 325 MG TABLET    Take 325 mg by mouth daily. Reported on 07/30/2015   CIPROFLOXACIN (CIPRO) 500 MG TABLET    Take 1 tablet (500 mg total) by mouth 2 (two) times daily.   GARLIC 10 MG CAPS    Take by mouth 2 (two) times daily. Reported on 07/30/2015   INSULIN ASPART (NOVOLOG) 100 UNIT/ML INJECTION    Inject 20 Units into the skin 3 (three) times daily with meals. Sliding scale. If blood sugar between 200-399 administer 20 units, 400 and over use 40 unitsInject into the skin. Sliding scale. If blood sugar between 200-399 administer 20 units, 400 and over use 40 units   INSULIN GLARGINE (LANTUS) 100 UNIT/ML INJECTION    Inject 1 mL (100 Units total) into the skin 2 (two) times daily.   LORAZEPAM (ATIVAN) 1 MG TABLET    Take 1 tablet (1 mg total) by mouth 2 (two) times daily as needed for anxiety.   METHADONE (DOLOPHINE) 10 MG TABLET    Limit 5-7 tabs by mouth daily if  tolerated   OMEGA-3 FATTY ACIDS (FISH OIL) 1000 MG CAPS    Take by mouth 2 (two) times daily.   OXYCODONE (OXY IR/ROXICODONE) 5 MG IMMEDIATE RELEASE TABLET    Limit 4 - 6 tabs po / day for breakthrough pain while taking methadone if tolerated   PROMETHAZINE (PHENERGAN) 25 MG TABLET    Take 1 tablet (25 mg total) by mouth every 6 (six) hours as needed for nausea or vomiting. 1-2 tabs bid prn    Review of Systems  Constitutional: Positive for fever and fatigue. Negative for chills.  HENT: Positive for congestion, postnasal drip, rhinorrhea, sinus pressure and sore throat (White patches in the back of her throat.notice the patches yesterday on the left side of her throat.). Negative for ear pain, sneezing and trouble swallowing.   Respiratory: Positive for cough, chest tightness, shortness of breath and wheezing.   Cardiovascular: Negative for chest pain, palpitations and leg swelling.       Feels like her hands are getting swollen. Started yesterday morning.  Gastrointestinal: Positive for nausea. Negative for vomiting and diarrhea.  Skin: Negative for rash.  Neurological: Positive for headaches. Negative for dizziness.    Social History  Substance Use Topics  . Smoking status: Current Every Day Smoker -- 1.00 packs/day for 44 years    Types: Cigarettes  .  Smokeless tobacco: Not on file  . Alcohol Use: No   Objective:   BP 160/80 mmHg  Pulse 88  Temp(Src) 97.6 F (36.4 C) (Oral)  Resp 16  Wt 235 lb 6.4 oz (106.777 kg)  SpO2 95%  Physical Exam  Constitutional: She appears well-developed and well-nourished. No distress.  HENT:  Head: Normocephalic and atraumatic.  Right Ear: Hearing, external ear and ear canal normal. Tympanic membrane is not erythematous and not bulging. A middle ear effusion is present.  Left Ear: Hearing, external ear and ear canal normal. Tympanic membrane is not erythematous and not bulging. A middle ear effusion is present.  Nose: Nose normal. Right sinus  exhibits no maxillary sinus tenderness and no frontal sinus tenderness. Left sinus exhibits no maxillary sinus tenderness and no frontal sinus tenderness.  Mouth/Throat: Uvula is midline and mucous membranes are normal. Posterior oropharyngeal edema and posterior oropharyngeal erythema present. No oropharyngeal exudate or tonsillar abscesses.  Eyes: Conjunctivae are normal. Pupils are equal, round, and reactive to light. Right eye exhibits no discharge. Left eye exhibits no discharge. No scleral icterus.  Neck: Normal range of motion. Neck supple. No tracheal deviation present. No thyromegaly present.  Cardiovascular: Normal rate, regular rhythm and normal heart sounds.  Exam reveals no gallop and no friction rub.   No murmur heard. Pulmonary/Chest: Effort normal and breath sounds normal. No stridor. No respiratory distress. She has no wheezes. She has no rales.  Lymphadenopathy:    She has no cervical adenopathy.  Skin: Skin is warm and dry. She is not diaphoretic.  Vitals reviewed.       Assessment & Plan:     1. Bacterial pharyngitis Worsening symptoms. I will give amoxicillin as below. I do feel this is most likely a bacterial pharyngitis secondary to high fever. Advised that she may benefit from salt water gargles or Chloraseptic spray. She may take Tylenol and/or ibuprofen as needed for body ache and fevers. She is to call the office if symptoms fail to improve or worsen. - amoxicillin (AMOXIL) 875 MG tablet; Take 1 tablet (875 mg total) by mouth 2 (two) times daily.  Dispense: 14 tablet; Refill: 0  2. Sore throat Rapid strep was negative today in the office. - POCT rapid strep A  3. Cough She is also having a cough and postnasal drainage with the symptoms. I will give Hycodan cough syrup as below. I did advise for her to make sure that she lets her pain doctor knows that she was given this medication. She voiced understanding and agrees to do so. She is to call the office if cough  persists. - HYDROcodone-homatropine (HYCODAN) 5-1.5 MG/5ML syrup; Take 5 mLs by mouth every 8 (eight) hours as needed.  Dispense: 180 mL; Refill: 0  4. Nausea Promethazine was given as below for nausea. She is to make sure to stay well-hydrated and get plenty of rest. She is to call symptoms fail to improve or worsen. - promethazine (PHENERGAN) 25 MG tablet; Take 1 tablet (25 mg total) by mouth every 6 (six) hours as needed for nausea or vomiting. 1-2 tabs bid prn  Dispense: 30 tablet; Refill: [place], [name]  [facility]

## 2015-08-26 NOTE — Patient Instructions (Signed)

## 2015-09-16 ENCOUNTER — Ambulatory Visit: Payer: Medicare Other | Attending: Pain Medicine | Admitting: Pain Medicine

## 2015-09-16 ENCOUNTER — Encounter: Payer: Self-pay | Admitting: Pain Medicine

## 2015-09-16 VITALS — BP 149/56 | HR 86 | Temp 98.5°F | Resp 14 | Ht 66.0 in | Wt 230.0 lb

## 2015-09-16 DIAGNOSIS — M47817 Spondylosis without myelopathy or radiculopathy, lumbosacral region: Secondary | ICD-10-CM | POA: Diagnosis not present

## 2015-09-16 DIAGNOSIS — Z96641 Presence of right artificial hip joint: Secondary | ICD-10-CM | POA: Insufficient documentation

## 2015-09-16 DIAGNOSIS — M1611 Unilateral primary osteoarthritis, right hip: Secondary | ICD-10-CM

## 2015-09-16 DIAGNOSIS — M47816 Spondylosis without myelopathy or radiculopathy, lumbar region: Secondary | ICD-10-CM | POA: Insufficient documentation

## 2015-09-16 DIAGNOSIS — M160 Bilateral primary osteoarthritis of hip: Secondary | ICD-10-CM

## 2015-09-16 DIAGNOSIS — E11621 Type 2 diabetes mellitus with foot ulcer: Secondary | ICD-10-CM | POA: Insufficient documentation

## 2015-09-16 DIAGNOSIS — M5135 Other intervertebral disc degeneration, thoracolumbar region: Secondary | ICD-10-CM | POA: Diagnosis not present

## 2015-09-16 DIAGNOSIS — M1711 Unilateral primary osteoarthritis, right knee: Secondary | ICD-10-CM | POA: Insufficient documentation

## 2015-09-16 DIAGNOSIS — M545 Low back pain: Secondary | ICD-10-CM | POA: Diagnosis present

## 2015-09-16 DIAGNOSIS — E0840 Diabetes mellitus due to underlying condition with diabetic neuropathy, unspecified: Secondary | ICD-10-CM | POA: Diagnosis not present

## 2015-09-16 DIAGNOSIS — L97519 Non-pressure chronic ulcer of other part of right foot with unspecified severity: Secondary | ICD-10-CM | POA: Diagnosis not present

## 2015-09-16 DIAGNOSIS — E1140 Type 2 diabetes mellitus with diabetic neuropathy, unspecified: Secondary | ICD-10-CM | POA: Insufficient documentation

## 2015-09-16 DIAGNOSIS — M79606 Pain in leg, unspecified: Secondary | ICD-10-CM | POA: Diagnosis present

## 2015-09-16 DIAGNOSIS — M533 Sacrococcygeal disorders, not elsewhere classified: Secondary | ICD-10-CM

## 2015-09-16 DIAGNOSIS — M5416 Radiculopathy, lumbar region: Secondary | ICD-10-CM | POA: Diagnosis not present

## 2015-09-16 DIAGNOSIS — M5136 Other intervertebral disc degeneration, lumbar region: Secondary | ICD-10-CM | POA: Diagnosis not present

## 2015-09-16 DIAGNOSIS — M791 Myalgia: Secondary | ICD-10-CM | POA: Diagnosis not present

## 2015-09-16 MED ORDER — METHADONE HCL 10 MG PO TABS: ORAL_TABLET | ORAL | Status: DC

## 2015-09-16 MED ORDER — OXYCODONE HCL 5 MG PO TABS: ORAL_TABLET | ORAL | Status: DC

## 2015-09-16 NOTE — Progress Notes (Signed)
Subjective:    Patient ID: Amy Meyers, female    DOB: Sep 07, 1960, 55 y.o.   MRN: [id number]  HPI  The patient is a 55 year old female who returns to pain management for further evaluation and treatment of pain involving the lower back and lower extremity region. The patient has history of diabetes mellitus and has had ulcerations of the feet of felt to be due to diabetic neuropathy/vasculopathy. At the present time patient continues to undergo evaluation and treatment by podiatrist who has been treated talus formation of the feet. At the present time patient is pain involving the lower extremity region is fairly well-controlled. The patient also is with history of total replacement on the right and feels that it may be time to have reevaluation of the hip after 17 years. The patient will undergo further evaluation of her right total hip as discussed. The patient has had right hip replacement for 17 years. We will continue medications methadone and oxycodone at this time and avoid interventional treatment. The patient continues to do rather well with the present treatment regimen and we will remain available to consider patient for interventional treatment should there be recurrence of ulcerations of the feet or increased pain of the lower back lower extremity region including the hip and knee. The patient will continue medication oxycodone and methadone and we will remain available to consider modifications of treatment pending follow-up evaluation all agreed to suggested treatment plan        Review of Systems     Objective:   Physical Exam  There was tends to palpation of paraspinal muscular treat the cervical region cervical facet region a mild degree with mild tenderness over the splenius capitis and occipitalis musculature regions. Palpation over the cervical facet cervical paraspinal musculature region was a tennis to palpation of moderate degree with palpation of the thoracic region  thoracic facet region reproducing pain of mild-to-moderate degree. No crepitus of the thoracic region was noted. The patient appeared to be with no increased pain with Tinel and Phalen's maneuver and was with bilaterally equal grip strength. Palpation over the region of the left hip was attends to palpation of minimal degree with mild to moderate tenderness over the region of the right hip. The right knee was attends to palpation with no increased warmth and erythema noted with crepitus of the knee and with EHL strength decreased on the right compared to the left. The right knee was attends to palpation with no increased warmth erythema of the knee. There was crepitus of the knee with negative anterior and posterior drawer signs without ballottement of the patella. There was decreased sensation of the lower extremities in a stocking-type distribution. There was negative clonus negative Homans. Straight leg raising was tolerates approximately 30 without increased pain with dorsiflexion noted. There was tenderness over the greater trochanteric region iliotibial band region a mild to moderate degree DTRs were difficult to elicit patient had difficulty relaxing. Abdomen was nontender with no costovertebral angle tenderness noted        Assessment & Plan:      Diabetes mellitus with diabetic neuropathy  Diabetic foot ulcer  Degenerative disc disease lumbar spine Multilevel degenerative changes of the lumbar spine with L4-L5 level involvement most significantly  Lumbar facet syndrome  Status post right total hip replacement  Degenerative joint disease right knee     Plan   Continue present medications oxycodone and methadone  F/U PCP Dr. Venia Minks for evaliation of  BP  diabetes  mellitus and general medical  condition.  F/U wound care clinic evaluation   F/U podiatrist as discussed  F/U surgical evaluation. Patient will consider surgical evaluation of right hip. Patient is status post  total replacement 17 years ago and will consider reevaluation of the right hip as discussed  F/U podiatrist as discussed  F/U neurological evaluation. May consider  May consider radiofrequency rhizolysis or intraspinal procedures pending response to present treatment and F/U evaluation.. We will avoid such treatment at this time  Patient to call [facility] should patient have concerns prior to scheduled return appointment

## 2015-09-16 NOTE — Patient Instructions (Addendum)
Plan   Continue present medications oxycodone and methadone  F/U PCP Dr. Venia Minks for evaliation of  BP  diabetes mellitus and general medical  condition.  F/U wound care clinic evaluation   F/U podiatrist  F/U surgical evaluation. Patient without plans for surgical intervention at this time  F/U podiatrist as discussed  F/U neurological evaluation. May consider  May consider radiofrequency rhizolysis or intraspinal procedures pending response to present treatment and F/U evaluation.. We will avoid such treatment at this time  Patient to call [facility] should patient have concerns prior to scheduled return appointment  A prescription for OXYCODONE and METHADONE was given to you today.

## 2015-09-23 LAB — TOXASSURE SELECT 13 (MW), URINE: PDF: 0

## 2015-09-30 ENCOUNTER — Other Ambulatory Visit: Payer: Self-pay

## 2015-09-30 DIAGNOSIS — Z794 Long term (current) use of insulin: Principal | ICD-10-CM

## 2015-09-30 DIAGNOSIS — E0840 Diabetes mellitus due to underlying condition with diabetic neuropathy, unspecified: Secondary | ICD-10-CM

## 2015-09-30 MED ORDER — INSULIN GLARGINE 100 UNIT/ML ~~LOC~~ SOLN: 100.0000 [IU] | Freq: Two times a day (BID) | SUBCUTANEOUS | Status: DC

## 2015-10-04 ENCOUNTER — Other Ambulatory Visit: Payer: Self-pay

## 2015-10-04 DIAGNOSIS — F419 Anxiety disorder, unspecified: Secondary | ICD-10-CM

## 2015-10-04 MED ORDER — LORAZEPAM 1 MG PO TABS: 1.0000 mg | ORAL_TABLET | Freq: Two times a day (BID) | ORAL | Status: DC | PRN

## 2015-10-04 NOTE — Telephone Encounter (Signed)
Prescription printed. Please notify patient it is ready for pick up. Thanks- Dr. Lorre Opdahl.  

## 2015-10-12 ENCOUNTER — Ambulatory Visit: Payer: Medicare Other | Attending: Pain Medicine | Admitting: Pain Medicine

## 2015-10-12 ENCOUNTER — Encounter: Payer: Self-pay | Admitting: Pain Medicine

## 2015-10-12 VITALS — BP 147/70 | HR 79 | Temp 98.6°F | Resp 16 | Ht 66.0 in | Wt 230.0 lb

## 2015-10-12 DIAGNOSIS — M5416 Radiculopathy, lumbar region: Secondary | ICD-10-CM | POA: Diagnosis not present

## 2015-10-12 DIAGNOSIS — M1611 Unilateral primary osteoarthritis, right hip: Secondary | ICD-10-CM

## 2015-10-12 DIAGNOSIS — M5136 Other intervertebral disc degeneration, lumbar region: Secondary | ICD-10-CM | POA: Diagnosis not present

## 2015-10-12 DIAGNOSIS — E11621 Type 2 diabetes mellitus with foot ulcer: Secondary | ICD-10-CM | POA: Diagnosis not present

## 2015-10-12 DIAGNOSIS — M1711 Unilateral primary osteoarthritis, right knee: Secondary | ICD-10-CM | POA: Diagnosis not present

## 2015-10-12 DIAGNOSIS — M79606 Pain in leg, unspecified: Secondary | ICD-10-CM | POA: Diagnosis present

## 2015-10-12 DIAGNOSIS — M533 Sacrococcygeal disorders, not elsewhere classified: Secondary | ICD-10-CM

## 2015-10-12 DIAGNOSIS — M25551 Pain in right hip: Secondary | ICD-10-CM | POA: Diagnosis present

## 2015-10-12 DIAGNOSIS — M545 Low back pain: Secondary | ICD-10-CM | POA: Diagnosis present

## 2015-10-12 DIAGNOSIS — Z96641 Presence of right artificial hip joint: Secondary | ICD-10-CM | POA: Insufficient documentation

## 2015-10-12 DIAGNOSIS — M47817 Spondylosis without myelopathy or radiculopathy, lumbosacral region: Secondary | ICD-10-CM | POA: Diagnosis not present

## 2015-10-12 DIAGNOSIS — E1140 Type 2 diabetes mellitus with diabetic neuropathy, unspecified: Secondary | ICD-10-CM | POA: Insufficient documentation

## 2015-10-12 DIAGNOSIS — L97519 Non-pressure chronic ulcer of other part of right foot with unspecified severity: Secondary | ICD-10-CM

## 2015-10-12 DIAGNOSIS — M160 Bilateral primary osteoarthritis of hip: Secondary | ICD-10-CM

## 2015-10-12 DIAGNOSIS — M5135 Other intervertebral disc degeneration, thoracolumbar region: Secondary | ICD-10-CM

## 2015-10-12 DIAGNOSIS — M47816 Spondylosis without myelopathy or radiculopathy, lumbar region: Secondary | ICD-10-CM

## 2015-10-12 DIAGNOSIS — M791 Myalgia: Secondary | ICD-10-CM | POA: Diagnosis not present

## 2015-10-12 DIAGNOSIS — E0840 Diabetes mellitus due to underlying condition with diabetic neuropathy, unspecified: Secondary | ICD-10-CM

## 2015-10-12 MED ORDER — METHADONE HCL 10 MG PO TABS: ORAL_TABLET | ORAL | Status: DC

## 2015-10-12 MED ORDER — OXYCODONE HCL 5 MG PO TABS: ORAL_TABLET | ORAL | Status: DC

## 2015-10-12 NOTE — Progress Notes (Signed)
   Subjective:    Patient ID: Amy Meyers, female    DOB: [birth year], 55 y.o.   MRN: [id number]  HPI    Review of Systems     Objective:   Physical Exam        Assessment & Plan:

## 2015-10-12 NOTE — Progress Notes (Signed)
Safety precautions to be maintained throughout the outpatient stay will include: orient to surroundings, keep bed in low position, maintain call bell within reach at all times, provide assistance with transfer out of bed and ambulation.  

## 2015-10-12 NOTE — Patient Instructions (Signed)
Plan   Continue present medications oxycodone and methadone  F/U PCP Dr. Venia Minks for evaliation of  BP  diabetes mellitus and general medical  condition.  F/U wound care clinic evaluation   F/U podiatrist  F/U surgical evaluation. Patient without plans for surgical intervention at this time  F/U podiatrist as discussed  F/U neurological evaluation. May consider  May consider radiofrequency rhizolysis or intraspinal procedures pending response to present treatment and F/U evaluation.. We will avoid such treatment at this time  Patient to call [facility] should patient have concerns prior to scheduled return appointment

## 2015-10-12 NOTE — Progress Notes (Signed)
Subjective:    Patient ID: Amy Meyers, female    DOB: 19-Oct-1960, 55 y.o.   MRN: [id number]  HPI  The patient is a 55 year old female who returns to pain management for further evaluation and treatment of pain involving the lower back and lower extremity region and region of the right hip and right knee. The patient is status post total hip replacement on the right and continues to be without significant pain of the right hip. The patient has had some pain involving the right knee at the present time patient is status post evaluation by Dr. Elvina Mattes for pain involving the right foot. 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 The patient is undergone interventional treatment in pain management and has had significant improvement of the ulcerations of the feet and is without any evidence of ulceration of the feet at this time. We will continue medications oxycodone and methadone and patient is to call pain management should they be change in condition prior to scheduled return appointment. All agreed to suggested treatment plan  Review of Systems     Objective:   Physical Exam  There was tenderness of the splenius capitis and occipitalis region of minimal degree with minimal tenderness of the cervical facet cervical paraspinal musculature region. Palpation over the region of the thoracic facet thoracic paraspinal must reason was with minimal tenderness to palpation and there was minimal tenderness of the acromioclavicular and glenohumeral joint regions. The patient appeared to be with bilaterally equal grip strength and Tinel and Phalen's maneuver were without increase of pain of significant degree. Palpation over the lumbar paraspinal must reason lumbar facet region was with moderate tenderness to palpation with lateral bending rotation extension and palpation of the lumbar facets  reproducing moderate discomfort. There was tenderness to palpation of the gluteal and piriformis musculature region of mild to moderate degree with mild to moderate tenderness of the PSIS and PII S region. There was mild tenderness along the greater trochanteric region iliotibial band region. The knee was attends to palpation of mild degree with negative anterior and posterior drawer signs without ballottement of the patella. There was crepitus of the knee. No increased warmth erythema of the knee was noted. There was no sensory deficit or dermatomal distribution of the lower extremities noted. There was no evidence of ulceration of the feet noted on today's evaluation There was negative clonus negative Homans. Abdomen nontender with no costovertebral tenderness noted      Assessment & Plan:    Diabetes mellitus with diabetic neuropathy  Diabetic foot ulcer  Degenerative disc disease lumbar spine Multilevel degenerative changes of the lumbar spine with L4-L5 level involvement most significantly  Lumbar facet syndrome  Status post right total hip replacement  Degenerative joint disease right knee      Plan   Continue present medications oxycodone and methadone  F/U PCP Dr. Venia Minks for evaliation of  BP  diabetes mellitus and general medical  condition.  F/U wound care clinic evaluation as needed  F/U podiatrist. Patient will follow-up with Dr. Elvina Mattes as discussed  F/U surgical evaluation. Patient without plans for surgical intervention at this time  F/U neurological evaluation. May consider  May consider radiofrequency rhizolysis or intraspinal procedures pending response to present treatment and F/U evaluation.. We will avoid such treatment at this time  Patient to call [facility] should patient have concerns prior to scheduled  return appointment

## 2015-10-13 ENCOUNTER — Ambulatory Visit: Payer: Medicare Other | Admitting: Pain Medicine

## 2015-10-18 ENCOUNTER — Ambulatory Visit: Payer: Medicare Other | Admitting: Pain Medicine

## 2015-11-03 ENCOUNTER — Other Ambulatory Visit: Payer: Self-pay

## 2015-11-03 DIAGNOSIS — Z794 Long term (current) use of insulin: Principal | ICD-10-CM

## 2015-11-03 DIAGNOSIS — E0840 Diabetes mellitus due to underlying condition with diabetic neuropathy, unspecified: Secondary | ICD-10-CM

## 2015-11-03 MED ORDER — INSULIN GLARGINE 100 UNIT/ML ~~LOC~~ SOLN: 100.0000 [IU] | Freq: Two times a day (BID) | SUBCUTANEOUS | Status: DC

## 2015-11-11 ENCOUNTER — Ambulatory Visit: Payer: Medicare Other | Admitting: Pain Medicine

## 2015-11-13 ENCOUNTER — Encounter: Payer: Self-pay | Admitting: *Deleted

## 2015-11-13 ENCOUNTER — Other Ambulatory Visit: Payer: Self-pay

## 2015-11-13 ENCOUNTER — Emergency Department
Admission: EM | Admit: 2015-11-13 | Discharge: 2015-11-13 | Disposition: A | Discharge status: Home / Self Care | Payer: Medicare Other | Attending: Emergency Medicine | Admitting: Emergency Medicine

## 2015-11-13 DIAGNOSIS — N390 Urinary tract infection, site not specified: Secondary | ICD-10-CM | POA: Insufficient documentation

## 2015-11-13 DIAGNOSIS — Z96641 Presence of right artificial hip joint: Secondary | ICD-10-CM | POA: Insufficient documentation

## 2015-11-13 DIAGNOSIS — R42 Dizziness and giddiness: Secondary | ICD-10-CM

## 2015-11-13 DIAGNOSIS — Z794 Long term (current) use of insulin: Secondary | ICD-10-CM | POA: Diagnosis not present

## 2015-11-13 DIAGNOSIS — I10 Essential (primary) hypertension: Secondary | ICD-10-CM | POA: Insufficient documentation

## 2015-11-13 DIAGNOSIS — E119 Type 2 diabetes mellitus without complications: Secondary | ICD-10-CM | POA: Insufficient documentation

## 2015-11-13 DIAGNOSIS — Z7982 Long term (current) use of aspirin: Secondary | ICD-10-CM | POA: Insufficient documentation

## 2015-11-13 DIAGNOSIS — Z79899 Other long term (current) drug therapy: Secondary | ICD-10-CM | POA: Diagnosis not present

## 2015-11-13 DIAGNOSIS — F17210 Nicotine dependence, cigarettes, uncomplicated: Secondary | ICD-10-CM | POA: Insufficient documentation

## 2015-11-13 DIAGNOSIS — M5135 Other intervertebral disc degeneration, thoracolumbar region: Secondary | ICD-10-CM | POA: Diagnosis not present

## 2015-11-13 DIAGNOSIS — M1990 Unspecified osteoarthritis, unspecified site: Secondary | ICD-10-CM | POA: Insufficient documentation

## 2015-11-13 LAB — CBC
HEMATOCRIT: 50.4 % — AB (ref 35.0–47.0)
Hemoglobin: 16.6 g/dL — ABNORMAL HIGH (ref 12.0–16.0)
MCH: 26.7 pg (ref 26.0–34.0)
MCHC: 32.9 g/dL (ref 32.0–36.0)
MCV: 81.3 fL (ref 80.0–100.0)
PLATELETS: 139 10*3/uL — AB (ref 150–440)
RBC: 6.2 MIL/uL — ABNORMAL HIGH (ref 3.80–5.20)
RDW: 17.2 % — AB (ref 11.5–14.5)
WBC: 11.3 10*3/uL — AB (ref 3.6–11.0)

## 2015-11-13 LAB — URINALYSIS COMPLETE WITH MICROSCOPIC (ARMC ONLY)
GLUCOSE, UA: NEGATIVE mg/dL
Hgb urine dipstick: NEGATIVE
NITRITE: NEGATIVE
Specific Gravity, Urine: 1.029 (ref 1.005–1.030)
pH: 5 (ref 5.0–8.0)

## 2015-11-13 LAB — BASIC METABOLIC PANEL
ANION GAP: 8 (ref 5–15)
BUN: 15 mg/dL (ref 6–20)
CO2: 32 mmol/L (ref 22–32)
CREATININE: 0.86 mg/dL (ref 0.44–1.00)
Calcium: 8.7 mg/dL — ABNORMAL LOW (ref 8.9–10.3)
Chloride: 97 mmol/L — ABNORMAL LOW (ref 101–111)
GLUCOSE: 219 mg/dL — AB (ref 65–99)
POTASSIUM: 4.2 mmol/L (ref 3.5–5.1)
SODIUM: 137 mmol/L (ref 135–145)

## 2015-11-13 MED ORDER — PROMETHAZINE HCL 25 MG PO TABS: 25.0000 mg | ORAL_TABLET | Freq: Four times a day (QID) | ORAL | Status: DC | PRN

## 2015-11-13 MED ORDER — DEXTROSE 5 % IV SOLN
1.0000 g | Freq: Once | INTRAVENOUS | Status: AC
  Administered 2015-11-13: 1 g via INTRAVENOUS
  Filled 2015-11-13: qty 10

## 2015-11-13 MED ORDER — SODIUM CHLORIDE 0.9 % IV BOLUS (SEPSIS)
1000.0000 mL | Freq: Once | INTRAVENOUS | Status: AC
  Administered 2015-11-13: 1000 mL via INTRAVENOUS

## 2015-11-13 MED ORDER — CEPHALEXIN 500 MG PO CAPS: 500.0000 mg | ORAL_CAPSULE | Freq: Three times a day (TID) | ORAL | Status: DC

## 2015-11-13 MED ORDER — ONDANSETRON 4 MG PO TBDP: 4.0000 mg | ORAL_TABLET | Freq: Three times a day (TID) | ORAL | Status: DC | PRN

## 2015-11-13 MED ORDER — ONDANSETRON HCL 4 MG/2ML IJ SOLN
4.0000 mg | Freq: Once | INTRAMUSCULAR | Status: AC
  Administered 2015-11-13: 4 mg via INTRAVENOUS
  Filled 2015-11-13: qty 2

## 2015-11-13 NOTE — ED Notes (Signed)
Pt complains of dizziness for the last three weeks with occasional headache and nausea, pt denies any other symptoms

## 2015-11-13 NOTE — ED Provider Notes (Signed)
[facility] Emergency Department Provider Note  Time seen: 7:26 PM  I have reviewed the triage vital signs and the nursing notes.   HISTORY  Chief Complaint Dizziness    HPI Amy Meyers is a 55 y.o. female with a past medical history of diabetes, anxiety, arthritis who presents to the emergency department for dizziness and intermittent headaches over the past 3 weeks. According to the patient for the past 3 weeks she'll intermittently become dizzy/lightheaded. Also states she is occasionally developed headaches although they tend to resolve with over-the-counter medications. Denies any headache at this time. Denies any focal weakness or numbness. Denies any fever. Patient states she has been intermittently nauseated and admits not eating and drinking as much is normal.     Past Medical History  Diagnosis Date  . Open wound of right foot   . Diabetes mellitus without complication ([place])   . Anxiety   . Arthritis   . Staph infection     right foot    Patient Active Problem List   Diagnosis Date Noted  . Callous ulcer ([place]) 05/11/2015  . Anxiety 04/13/2015  . Arthritis, degenerative 04/06/2015  . Neuropathy ([place]) 04/06/2015  . Diabetic foot ulcer ([place]) 04/06/2015  . Back pain, chronic 04/06/2015  . Abnormal ECG 03/17/2015  . Hypertension 03/17/2015  . NASH (nonalcoholic steatohepatitis) 03/17/2015  . Obesity 03/17/2015  . Palpitations 03/17/2015  . Pulmonary edema 03/17/2015  . PVC's (premature ventricular contractions) 03/17/2015  . Breath shortness 03/17/2015  . Headache, tension-type 03/17/2015  . Compulsive tobacco user syndrome 03/17/2015  . Diabetes mellitus, type 2 ([place]) 03/11/2015  . Type 2 diabetes mellitus with right diabetic foot ulcer ([place]) 01/06/2015  . Facet syndrome, lumbar 11/26/2014  . Sacroiliac joint dysfunction 11/26/2014  . DDD (degenerative disc disease), thoracolumbar 09/29/2014  . Diabetes mellitus due to underlying  condition with diabetic neuropathy ([place]) 09/29/2014  . 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 Osteomyelitis of lower leg ([place]) 03/11/2014  . Current tobacco use 12/19/2013  . Systemic inflammatory response syndrome (SIRS) ([place]) 12/16/2013  . Arthritis 12/16/2013    Past Surgical History  Procedure Laterality Date  . Carpal tunnel release Bilateral   . Foot surgery Left   . Abdominal adhesion surgery    . Abdominal hysterectomy  1998    with oophorectomy due to adhesions  . Cholecystectomy  1981  . Tonsillectomy  1980  . Joint replacement      right hip replacement  . Cesarean section  1988 & 1989    x 2   . Upper gi x ray series  06/04/2001    Hiatal Hernia  . Tonsillectomy      Current Outpatient Rx  Name  Route  Sig  Dispense  Refill  . aspirin 325 MG tablet   Oral   Take 325 mg by mouth daily. Reported on 07/30/2015         . insulin aspart (NOVOLOG) 100 UNIT/ML injection   Subcutaneous   Inject 20 Units into the skin 3 (three) times daily with meals. Sliding scale. If blood sugar between 200-399 administer 20 units, 400 and over use 40 unitsInject into the skin. Sliding scale. If blood sugar between 200-399 administer 20 units, 400 and over use 40 units   10 mL   3   . insulin glargine (LANTUS) 100 UNIT/ML injection   Subcutaneous   Inject 1 mL (100 Units total) into the skin 2 (two) times  daily.   60 mL   5   . LORazepam (ATIVAN) 1 MG tablet   Oral   Take 1 tablet (1 mg total) by mouth 2 (two) times daily as needed for anxiety.   60 tablet   5   . methadone (DOLOPHINE) 10 MG tablet      Limit 5-7 tabs by mouth daily if tolerated   210 tablet   0   . Omega-3 Fatty Acids (FISH OIL) 1000 MG CAPS   Oral   Take by mouth 2 (two) times daily.         Marland Kitchen oxyCODONE (OXY IR/ROXICODONE) 5 MG immediate release tablet      Limit 4 - 6 tabs po / day for breakthrough pain while taking methadone if tolerated   180  tablet   0   . promethazine (PHENERGAN) 25 MG tablet   Oral   Take 1 tablet (25 mg total) by mouth every 6 (six) hours as needed for nausea or vomiting. 1-2 tabs bid prn   30 tablet   1     Allergies Meloxicam and Sulfa antibiotics  Family History  Problem Relation Age of Onset  . Alcohol abuse Mother   . Arthritis Mother   . COPD Mother   . Depression Mother   . Diabetes Mother   . Alcohol abuse Father   . Depression Father   . Early death Father   . Learning disabilities Father   . Alcohol abuse Brother   . Arthritis Brother   . Diabetes Maternal Grandmother   . Varicose Veins Paternal Grandmother   . Diabetes Maternal Grandfather   . Alcohol abuse Paternal Grandfather     Social History Social History  Substance Use Topics  . Smoking status: Current Every Day Smoker -- 1.00 packs/day for 44 years    Types: Cigarettes  . Smokeless tobacco: None  . Alcohol Use: No    Review of Systems Constitutional: Negative for fever. Cardiovascular: Negative for chest pain. Respiratory: Negative for shortness of breath. Gastrointestinal: Negative for abdominal pain, vomiting and diarrhea.Positive for nausea. Genitourinary: Negative for dysuria. Positive for cloudy urine. Neurological: Intermittent headaches but denies focal weakness or numbness. 10-point ROS otherwise negative.  ____________________________________________   PHYSICAL EXAM:  VITAL SIGNS: ED Triage Vitals  Enc Vitals Group     BP 11/13/15 1520 174/94 mmHg     Pulse Rate 11/13/15 1520 95     Resp 11/13/15 1520 20     Temp 11/13/15 1520 98.1 F (36.7 C)     Temp Source 11/13/15 1520 Oral     SpO2 11/13/15 1520 96 %     Weight 11/13/15 1520 220 lb (99.791 kg)     Height 11/13/15 1520 5\' 6"  (1.676 m)     Head Cir --      Peak Flow --      Pain Score --      Pain Loc --      Pain Edu? --      Excl. in [place]? --     Constitutional: Alert and oriented. Well appearing and in no distress.Sitting in  bed reading a book, appears very well. Eyes: Normal exam ENT   Head: Normocephalic and atraumatic.   Mouth/Throat: Mucous membranes are moist. Cardiovascular: Normal rate, regular rhythm. No murmur Respiratory: Normal respiratory effort without tachypnea nor retractions. Breath sounds are clear  Gastrointestinal: Soft and nontender. No distention.   Musculoskeletal: Nontender with normal range of motion in all extremities. Neurologic:  Normal speech and language. No gross focal neurologic deficits  Skin:  Skin is warm, dry and intact.  Psychiatric: Mood and affect are normal. Speech and behavior are normal.   ____________________________________________    EKG  EKG reviewed and interpreted by myself shows normal sinus rhythm at 94 bpm, widened QRS, normal axis, RSR pattern most consistent with right bundle branch block, nonspecific but no concerning ST changes.  ____________________________________________    INITIAL IMPRESSION / ASSESSMENT AND PLAN / ED COURSE  Pertinent labs & imaging results that were available during my care of the patient were reviewed by me and considered in my medical decision making (see chart for details).  Patient presents with intermittent dizziness over the past 3 weeks. She states she has noted increased cloudiness of the urine but denies dysuria. Patient has urinalysis shows too numerous to count white blood cells, 1+ leukocyte esterase. We will treat with Rocephin. Overall the patient appears very well, denies any chest pain at any point. We will dose IV fluids, IV Rocephin, and IV Zofran for nausea.  Patient states she is feeling much better, we'll discharge home with Zofran and antibiotics. Patient is follow up with her primary care doctor. I discussed return precautions for any increasing dizziness/lightheadedness, or any chest pain.  ____________________________________________   FINAL CLINICAL IMPRESSION(S) / ED DIAGNOSES  Urinary tract  infection Dizziness   Harvest Dark, MD 11/13/15 1931

## 2015-11-13 NOTE — Discharge Instructions (Signed)
Urinary Tract Infection Urinary tract infections (UTIs) can develop anywhere along your urinary tract. Your urinary tract is your body's drainage system for removing wastes and extra water. Your urinary tract includes two kidneys, two ureters, a bladder, and a urethra. Your kidneys are a pair of bean-shaped organs. Each kidney is about the size of your fist. They are located below your ribs, one on each side of your spine. CAUSES Infections are caused by microbes, which are microscopic organisms, including fungi, viruses, and bacteria. These organisms are so small that they can only be seen through a microscope. Bacteria are the microbes that most commonly cause UTIs. SYMPTOMS  Symptoms of UTIs may vary by age and gender of the patient and by the location of the infection. Symptoms in young women typically include a frequent and intense urge to urinate and a painful, burning feeling in the bladder or urethra during urination. Older women and men are more likely to be tired, shaky, and weak and have muscle aches and abdominal pain. A fever may mean the infection is in your kidneys. Other symptoms of a kidney infection include pain in your back or sides below the ribs, nausea, and vomiting. DIAGNOSIS To diagnose a UTI, your caregiver will ask you about your symptoms. Your caregiver will also ask you to provide a urine sample. The urine sample will be tested for bacteria and white blood cells. White blood cells are made by your body to help fight infection. TREATMENT  Typically, UTIs can be treated with medication. Because most UTIs are caused by a bacterial infection, they usually can be treated with the use of antibiotics. The choice of antibiotic and length of treatment depend on your symptoms and the type of bacteria causing your infection. HOME CARE INSTRUCTIONS  If you were prescribed antibiotics, take them exactly as your caregiver instructs you. Finish the medication even if you feel better after  you have only taken some of the medication.  Drink enough water and fluids to keep your urine clear or pale yellow.  Avoid caffeine, tea, and carbonated beverages. They tend to irritate your bladder.  Empty your bladder often. Avoid holding urine for long periods of time.  Empty your bladder before and after sexual intercourse.  After a bowel movement, women should cleanse from front to back. Use each tissue only once. SEEK MEDICAL CARE IF:   You have back pain.  You develop a fever.  Your symptoms do not begin to resolve within 3 days. SEEK IMMEDIATE MEDICAL CARE IF:   You have severe back pain or lower abdominal pain.  You develop chills.  You have nausea or vomiting.  You have continued burning or discomfort with urination. MAKE SURE YOU:   Understand these instructions.  Will watch your condition.  Will get help right away if you are not doing well or get worse.   This information is not intended to replace advice given to you by your health care provider. Make sure you discuss any questions you have with your health care provider.   Document Released: 02/22/2005 Document Revised: 02/03/2015 Document Reviewed: 06/23/2011 Elsevier Interactive Patient Education 2016 Elsevier Inc[employer]  Dizziness Dizziness is a common problem. It is a feeling of unsteadiness or light-headedness. You may feel like you are about to faint. Dizziness can lead to injury if you stumble or fall. Anyone can become dizzy, but dizziness is more common in older adults. This condition can be caused by a number of things, including medicines, dehydration, or illness.  HOME CARE INSTRUCTIONS Taking these steps may help with your condition: Eating and Drinking  Drink enough fluid to keep your urine clear or pale yellow. This helps to keep you from becoming dehydrated. Try to drink more clear fluids, such as water.  Do not drink alcohol.  Limit your caffeine intake if directed by your health care  provider.  Limit your salt intake if directed by your health care provider. Activity  Avoid making quick movements.  Rise slowly from chairs and steady yourself until you feel okay.  In the morning, first sit up on the side of the bed. When you feel okay, stand slowly while you hold onto something until you know that your balance is fine.  Move your legs often if you need to stand in one place for a long time. Tighten and relax your muscles in your legs while you are standing.  Do not drive or operate heavy machinery if you feel dizzy.  Avoid bending down if you feel dizzy. Place items in your home so that they are easy for you to reach without leaning over. Lifestyle  Do not use any tobacco products, including cigarettes, chewing tobacco, or electronic cigarettes. If you need help quitting, ask your health care provider.  Try to reduce your stress level, such as with yoga or meditation. Talk with your health care provider if you need help. General Instructions  Watch your dizziness for any changes.  Take medicines only as directed by your health care provider. Talk with your health care provider if you think that your dizziness is caused by a medicine that you are taking.  Tell a friend or a family member that you are feeling dizzy. If he or she notices any changes in your behavior, have this person call your health care provider.  Keep all follow-up visits as directed by your health care provider. This is important. SEEK MEDICAL CARE IF:  Your dizziness does not go away.  Your dizziness or light-headedness gets worse.  You feel nauseous.  You have reduced hearing.  You have new symptoms.  You are unsteady on your feet or you feel like the room is spinning. SEEK IMMEDIATE MEDICAL CARE IF:  You vomit or have diarrhea and are unable to eat or drink anything.  You have problems talking, walking, swallowing, or using your arms, hands, or legs.  You feel generally  weak.  You are not thinking clearly or you have trouble forming sentences. It may take a friend or family member to notice this.  You have chest pain, abdominal pain, shortness of breath, or sweating.  Your vision changes.  You notice any bleeding.  You have a headache.  You have neck pain or a stiff neck.  You have a fever.   This information is not intended to replace advice given to you by your health care provider. Make sure you discuss any questions you have with your health care provider.   Document Released: 11/08/2000 Document Revised: 09/29/2014 Document Reviewed: 05/11/2014 Elsevier Interactive Patient Education [employer].

## 2015-11-16 ENCOUNTER — Ambulatory Visit: Payer: Medicare Other | Attending: Pain Medicine | Admitting: Pain Medicine

## 2015-11-16 ENCOUNTER — Encounter: Payer: Self-pay | Admitting: Pain Medicine

## 2015-11-16 VITALS — BP 139/75 | HR 84 | Temp 98.5°F | Resp 16 | Ht 66.0 in | Wt 220.0 lb

## 2015-11-16 DIAGNOSIS — Z96641 Presence of right artificial hip joint: Secondary | ICD-10-CM | POA: Insufficient documentation

## 2015-11-16 DIAGNOSIS — M47817 Spondylosis without myelopathy or radiculopathy, lumbosacral region: Secondary | ICD-10-CM | POA: Diagnosis not present

## 2015-11-16 DIAGNOSIS — M5136 Other intervertebral disc degeneration, lumbar region: Secondary | ICD-10-CM | POA: Insufficient documentation

## 2015-11-16 DIAGNOSIS — M533 Sacrococcygeal disorders, not elsewhere classified: Secondary | ICD-10-CM

## 2015-11-16 DIAGNOSIS — M791 Myalgia: Secondary | ICD-10-CM | POA: Diagnosis not present

## 2015-11-16 DIAGNOSIS — E11621 Type 2 diabetes mellitus with foot ulcer: Secondary | ICD-10-CM | POA: Insufficient documentation

## 2015-11-16 DIAGNOSIS — E0840 Diabetes mellitus due to underlying condition with diabetic neuropathy, unspecified: Secondary | ICD-10-CM

## 2015-11-16 DIAGNOSIS — M5416 Radiculopathy, lumbar region: Secondary | ICD-10-CM | POA: Diagnosis not present

## 2015-11-16 DIAGNOSIS — M1711 Unilateral primary osteoarthritis, right knee: Secondary | ICD-10-CM | POA: Diagnosis not present

## 2015-11-16 DIAGNOSIS — E1140 Type 2 diabetes mellitus with diabetic neuropathy, unspecified: Secondary | ICD-10-CM | POA: Insufficient documentation

## 2015-11-16 DIAGNOSIS — M1611 Unilateral primary osteoarthritis, right hip: Secondary | ICD-10-CM

## 2015-11-16 DIAGNOSIS — M5135 Other intervertebral disc degeneration, thoracolumbar region: Secondary | ICD-10-CM

## 2015-11-16 DIAGNOSIS — M160 Bilateral primary osteoarthritis of hip: Secondary | ICD-10-CM

## 2015-11-16 DIAGNOSIS — M47816 Spondylosis without myelopathy or radiculopathy, lumbar region: Secondary | ICD-10-CM | POA: Insufficient documentation

## 2015-11-16 DIAGNOSIS — M545 Low back pain: Secondary | ICD-10-CM | POA: Diagnosis present

## 2015-11-16 DIAGNOSIS — L97519 Non-pressure chronic ulcer of other part of right foot with unspecified severity: Secondary | ICD-10-CM

## 2015-11-16 LAB — URINE CULTURE

## 2015-11-16 MED ORDER — METHADONE HCL 10 MG PO TABS: ORAL_TABLET | ORAL | Status: DC

## 2015-11-16 MED ORDER — OXYCODONE HCL 5 MG PO TABS: ORAL_TABLET | ORAL | Status: DC

## 2015-11-16 NOTE — Progress Notes (Signed)
Subjective:    Patient ID: Amy Meyers, female    DOB: [birth year], 58 y.o.   MRN: [id number]  HPI  The patient is a 55 year old female who returns to pain management for further evaluation and treatment of pain involving the region of the lower back lower extremity region with pain which is involving the region of the right hip and right knee. The patient is status post right total hip replacement and is with degenerative changes of the right knee. Present time patient will follow-up with Amy Meyers further evaluation of her diabetes mellitus kidney infection and general medical condition. We will continue oxycodone and methadone and we will remain available to consider patient for additional modifications of treatment regimen pending follow-up evaluation. The patient also has history of diabetes mellitus ulceration of the feet which is improved significantly with prior interventional treatment performed in pain management Center. The patient is without evidence of ulceration or lesions of the treat this time. We remain available to consider modification of treatment regimen pending response to treatment and follow-up evaluation. All agreed to suggested treatment plan.  Review of Systems     Objective:   Physical Exam  There was tenderness to palpation of the paraspinal muscular treat and cervical region cervical facet region palpation which reproduces mild discomfort with mild tenderness of the splenius capitis and occipitalis regions with mild tenderness of the acromioclavicular and glenohumeral joint region and with bilaterally equal grip strength with Tinel and Phalen's maneuver reproducing minimal discomfort. Palpation over the thoracic region was with no crepitus of the thoracic region noted. There was evidence of mild muscle spasm involving thoracic paraspinal musculature region. Palpation over the lumbar paraspinal musculatures and lumbar facet region was with mild to moderate muscle spasm  and tenderness to palpation.. Palpation over the PSIS and PII S region reproduced mild to moderate discomfort. Lateral bending rotation extension and palpation of the lumbar facets reproduce mild to moderate discomfort. There was mild to moderate tenderness along the PSIS and PII S region a greater trochanteric region iliotibial band region. The knee was with mild tenderness to palpation of moderate tenderness to palpation with no increased warmth and erythema in the region of the knee. There was crepitus of the knees. There appeared to be negative anterior and posterior drawer signs without ballottement of the patella. No increased laxity of the joint of the knee was noted. There was negative clonus negative Homans. No definite sensory deficit or dermatomal distribution of the lower extremity is noted. EHL strength appeared to be slightly decreased. Abdomen nontender with mild costovertebral tenderness noted.      Assessment & Plan:       Diabetes mellitus with diabetic neuropathy  Diabetic foot ulcer  Degenerative disc disease lumbar spine Multilevel degenerative changes of the lumbar spine with L4-L5 level involvement most significantly  Lumbar facet syndrome  Status post right total hip replacement  Degenerative joint disease right knee     Plan   Continue present medications oxycodone and methadone  F/U PCP Amy Meyers or other PCP for evaliation of  BP kidney infection  diabetes mellitus and general medical  condition.  F/U wound care clinic evaluation as needed  F/U surgical evaluation. Patient without plans for surgical intervention at this time  F/U podiatrist as discussed  F/U neurological evaluation. May consider  May consider radiofrequency rhizolysis or intraspinal procedures pending response to present treatment and F/U evaluation.. We will avoid such treatment at this time  Patient to call Pain  Management Center should patient have concerns prior to scheduled  return appointment

## 2015-11-16 NOTE — Patient Instructions (Addendum)
Plan   Continue present medications oxycodone and methadone  F/U PCP Dr. Venia Minks or other PCP for evaliation of  BP kidney infection  diabetes mellitus and general medical  condition.  F/U wound care clinic evaluation as needed  F/U surgical evaluation. Patient without plans for surgical intervention at this time  F/U podiatrist as discussed  F/U neurological evaluation. May consider  May consider radiofrequency rhizolysis or intraspinal procedures pending response to present treatment and F/U evaluation.. We will avoid such treatment at this time  Patient to call Pain Management Center should patient have concerns prior to scheduled return appointment

## 2015-11-16 NOTE — Progress Notes (Signed)
Safety precautions to be maintained throughout the outpatient stay will include: orient to surroundings, keep bed in low position, maintain call bell within reach at all times, provide assistance with transfer out of bed and ambulation.  Patient went to ED last week and was treated for kidney infection.

## 2015-12-15 ENCOUNTER — Ambulatory Visit: Payer: Medicare Other | Attending: Pain Medicine | Admitting: Pain Medicine

## 2015-12-15 ENCOUNTER — Encounter: Payer: Self-pay | Admitting: Pain Medicine

## 2015-12-15 VITALS — BP 140/70 | HR 72 | Temp 98.0°F | Resp 18 | Ht 66.0 in | Wt 220.0 lb

## 2015-12-15 DIAGNOSIS — E11621 Type 2 diabetes mellitus with foot ulcer: Secondary | ICD-10-CM | POA: Insufficient documentation

## 2015-12-15 DIAGNOSIS — L97519 Non-pressure chronic ulcer of other part of right foot with unspecified severity: Secondary | ICD-10-CM

## 2015-12-15 DIAGNOSIS — M1611 Unilateral primary osteoarthritis, right hip: Secondary | ICD-10-CM

## 2015-12-15 DIAGNOSIS — Z96641 Presence of right artificial hip joint: Secondary | ICD-10-CM | POA: Diagnosis not present

## 2015-12-15 DIAGNOSIS — M5135 Other intervertebral disc degeneration, thoracolumbar region: Secondary | ICD-10-CM

## 2015-12-15 DIAGNOSIS — M5136 Other intervertebral disc degeneration, lumbar region: Secondary | ICD-10-CM | POA: Insufficient documentation

## 2015-12-15 DIAGNOSIS — M47816 Spondylosis without myelopathy or radiculopathy, lumbar region: Secondary | ICD-10-CM | POA: Diagnosis not present

## 2015-12-15 DIAGNOSIS — M47817 Spondylosis without myelopathy or radiculopathy, lumbosacral region: Secondary | ICD-10-CM | POA: Diagnosis not present

## 2015-12-15 DIAGNOSIS — M62830 Muscle spasm of back: Secondary | ICD-10-CM | POA: Diagnosis not present

## 2015-12-15 DIAGNOSIS — M545 Low back pain: Secondary | ICD-10-CM | POA: Diagnosis present

## 2015-12-15 DIAGNOSIS — M533 Sacrococcygeal disorders, not elsewhere classified: Secondary | ICD-10-CM | POA: Diagnosis not present

## 2015-12-15 DIAGNOSIS — M25551 Pain in right hip: Secondary | ICD-10-CM | POA: Diagnosis present

## 2015-12-15 DIAGNOSIS — E0840 Diabetes mellitus due to underlying condition with diabetic neuropathy, unspecified: Secondary | ICD-10-CM

## 2015-12-15 DIAGNOSIS — E1140 Type 2 diabetes mellitus with diabetic neuropathy, unspecified: Secondary | ICD-10-CM | POA: Insufficient documentation

## 2015-12-15 DIAGNOSIS — M1711 Unilateral primary osteoarthritis, right knee: Secondary | ICD-10-CM | POA: Diagnosis not present

## 2015-12-15 DIAGNOSIS — M160 Bilateral primary osteoarthritis of hip: Secondary | ICD-10-CM

## 2015-12-15 DIAGNOSIS — M5416 Radiculopathy, lumbar region: Secondary | ICD-10-CM | POA: Diagnosis not present

## 2015-12-15 DIAGNOSIS — M791 Myalgia: Secondary | ICD-10-CM | POA: Diagnosis not present

## 2015-12-15 DIAGNOSIS — M79606 Pain in leg, unspecified: Secondary | ICD-10-CM | POA: Diagnosis present

## 2015-12-15 MED ORDER — METHADONE HCL 10 MG PO TABS: ORAL_TABLET | ORAL | Status: DC

## 2015-12-15 MED ORDER — OXYCODONE HCL 5 MG PO TABS: ORAL_TABLET | ORAL | Status: DC

## 2015-12-15 NOTE — Progress Notes (Signed)
Safety precautions to be maintained throughout the outpatient stay will include: orient to surroundings, keep bed in low position, maintain call bell within reach at all times, provide assistance with transfer out of bed and ambulation.  

## 2015-12-15 NOTE — Progress Notes (Signed)
   Subjective:    Patient ID: Amy Meyers, female    DOB: 31-Dec-1960, 55 y.o.   MRN: [id number]  HPI  The patient is a 55 year old female who returns to pain management for further evaluation and treatment of pain involving the lower back lower extremity region especially the region of the right hip and right knee. The patient is with history of diabetes mellitus and has had development of ulcerations of the feet which is being treated with lumbar sympathetic blocks and other treatment in pain management Center with significant improvement with resolution of ulcerations of the feet. The patient also is with history of prior total hip replacement as well as degenerative joint disease of the knee especially the right knee which has improved with treatment in pain management as well. At the present time we will avoid interventional treatment and we'll continue medications consisting of oxycodone and methadone. The patient denies any trauma change in events of daily living the call significant change in symptomatology. All agreed to suggested treatment plan  Review of Systems     Objective:   Physical Exam   There was tenderness of the splenius capitis and occipitalis region palpation which be produced pain of minimal degree with minimal tenderness over the cervical facet cervical paraspinal musculature region. The patient was with unremarkable Spurling's maneuver. The patient was able to perform drop test with mild to moderate difficulty. The patient was with bilaterally equal grip strength without increased pain with Tinel and Phalen's. Palpation over the thoracic region was without crepitus of the thoracic region. Palpation over the lumbar region was attends to palpation with evidence of mild muscle spasm to moderate muscle spasm with lateral bending rotation extension and palpation of the lumbar facets reproducing discomfort of moderate degree. There was tenderness of the gluteal and piriformis  musculature region as well as the PSIS and PII S region a moderate degree. Straight leg raising was limited to approximately 20 without increase of pain with dorsiflexion noted. The knee wants was negative anterior and posterior drawer signs without ballottement of the patella. There was no increased warmth and erythema in the region of the knees. There was questionably decreased sensation of the lower extremities in a stocking-type distribution. There was negative clonus negative Homans. Abdomen was without tenderness to palpation and no costovertebral tenderness was noted     Assessment & Plan:     Diabetes mellitus with diabetic neuropathy  Diabetic foot ulcer  Degenerative disc disease lumbar spine Multilevel degenerative changes of the lumbar spine with L4-L5 level involvement most significantly  Lumbar facet syndrome  Status post right total hip replacement  Degenerative joint disease right knee     Plan   Continue present medications oxycodone and methadone  F/U PCP Dr. Venia Minks or other PCP for evaluation of  BP kidney infection  diabetes mellitus and general medical  condition.  F/U wound care clinic evaluation as needed  F/U surgical evaluation. Patient without plans for surgical intervention at this time  F/U with Dr. Venia Minks regarding index finger of the right hand we may obtain x-ray, MRI and other studies pending follow-up evaluation. We may also consider rheumatological evaluation  F/U podiatrist as discussed  F/U neurological evaluation. May consider  May consider radiofrequency rhizolysis or intraspinal procedures pending response to present treatment and F/U evaluation.. We will avoid such treatment at this time  Patient to call Pain Management Center should patient have concerns prior to scheduled return appointment

## 2015-12-15 NOTE — Patient Instructions (Addendum)
Plan   Continue present medications oxycodone and methadone  F/U PCP Dr. Venia Minks or other PCP for evaluation of  BP kidney infection  diabetes mellitus and general medical  condition.  F/U wound care clinic evaluation as needed  F/U surgical evaluation. Patient without plans for surgical intervention at this time  F/U with Dr. Venia Minks regarding index finger of the right hand we may obtain x-ray, MRI and other studies pending follow-up evaluation. We may also consider rheumatological evaluation  F/U podiatrist as discussed  F/U neurological evaluation. May consider  May consider radiofrequency rhizolysis or intraspinal procedures pending response to present treatment and F/U evaluation.. We will avoid such treatment at this time  Patient to call Pain Management Center should patient have concerns prior to scheduled return appointmentPain Management Discharge Instructions  General Discharge Instructions :  If you need to reach your doctor call: Monday-Friday 8:00 am - 4:00 pm at [phone] or toll free [phone].  After clinic hours [phone] to have operator reach doctor.  Bring all of your medication bottles to all your appointments in the pain clinic.  To cancel or reschedule your appointment with Pain Management please remember to call 24 hours in advance to avoid a fee.  Refer to the educational materials which you have been given on: General Risks, I had my Procedure. Discharge Instructions, Post Sedation.  Post Procedure Instructions:  The drugs you were given will stay in your system until tomorrow, so for the next 24 hours you should not drive, make any legal decisions or drink any alcoholic beverages.  You may eat anything you prefer, but it is better to start with liquids then soups and crackers, and gradually work up to solid foods.  Please notify your doctor immediately if you have any unusual bleeding, trouble breathing or pain that is not related to your  normal pain.  Depending on the type of procedure that was done, some parts of your body may feel week and/or numb.  This usually clears up by tonight or the next day.  Walk with the use of an assistive device or accompanied by an adult for the 24 hours.  You may use ice on the affected area for the first 24 hours.  Put ice in a Ziploc bag and cover with a towel and place against area 15 minutes on 15 minutes off.  You may switch to heat after 24 hours.

## 2015-12-16 ENCOUNTER — Telehealth: Payer: Self-pay | Admitting: Family Medicine

## 2015-12-16 DIAGNOSIS — Z794 Long term (current) use of insulin: Principal | ICD-10-CM

## 2015-12-16 DIAGNOSIS — E0840 Diabetes mellitus due to underlying condition with diabetic neuropathy, unspecified: Secondary | ICD-10-CM

## 2015-12-16 NOTE — Telephone Encounter (Signed)
Pt contacted office for refill request on the following medications:  insulin glargine (LANTUS) 100 UNIT/ML injection.  [id number]  Pt daughter states pt son froze her extra bottles of insulin and the pharmacy states pt can not use them now.  Pt daughter is requesting samples if possible/MW

## 2015-12-17 MED ORDER — INSULIN GLARGINE 100 UNIT/ML ~~LOC~~ SOLN: 100.0000 [IU] | Freq: Two times a day (BID) | SUBCUTANEOUS | Status: DC

## 2015-12-21 ENCOUNTER — Telehealth: Payer: Self-pay

## 2015-12-21 ENCOUNTER — Telehealth: Payer: Self-pay | Admitting: Family Medicine

## 2015-12-21 NOTE — Telephone Encounter (Signed)
She can have a sample pen of Basaglar, or Toujeo...these are the same as Lantus and can take the same number of units per day. Needs follow up o.v. Within a month.

## 2015-12-21 NOTE — Telephone Encounter (Signed)
Patient's daughter called saying that patient seems to have some shortness of breath that is worse with exertion. After speaking directly to the patient, she reports that she has had a "chest cold" for the last 2 days. She has congestion in her chest, and her voice is hoarse. Patient has not been taking anything OTC for symptoms. Patient denies fever or sore throat. Denies any wheezing. She does have a productive cough. Her cough is the same throughout the day. Patient is requesting something to help with her symptoms. (Patient's daughter was concerned about the shortness of breath. However, patient did not think that her symptoms were emergent).   Patient also mentions that her son accidentally put her insulin in the freezer and her pharmacist told her that it is no good. She has been out of insulin for the last 2 days. Patient wanted to know if we had any samples of Lantus available? She reports that she can not get a refill until the 28th of this month. Please advise. Thanks!

## 2015-12-21 NOTE — Telephone Encounter (Signed)
Pt called back to give additional contact # to be reached [phone]

## 2015-12-21 NOTE — Telephone Encounter (Signed)
Advised patient as below. Left samples up front for pick up.

## 2016-01-05 ENCOUNTER — Ambulatory Visit: Payer: Medicare Other | Admitting: Family Medicine

## 2016-01-13 ENCOUNTER — Encounter: Payer: Self-pay | Admitting: Pain Medicine

## 2016-01-13 ENCOUNTER — Ambulatory Visit: Payer: Medicare Other | Attending: Pain Medicine | Admitting: Pain Medicine

## 2016-01-13 VITALS — BP 129/60 | HR 79 | Temp 98.5°F | Resp 16 | Ht 66.0 in | Wt 220.0 lb

## 2016-01-13 DIAGNOSIS — M5416 Radiculopathy, lumbar region: Secondary | ICD-10-CM | POA: Diagnosis not present

## 2016-01-13 DIAGNOSIS — M47816 Spondylosis without myelopathy or radiculopathy, lumbar region: Secondary | ICD-10-CM | POA: Diagnosis not present

## 2016-01-13 DIAGNOSIS — M791 Myalgia: Secondary | ICD-10-CM | POA: Diagnosis not present

## 2016-01-13 DIAGNOSIS — M5136 Other intervertebral disc degeneration, lumbar region: Secondary | ICD-10-CM | POA: Diagnosis not present

## 2016-01-13 DIAGNOSIS — E1140 Type 2 diabetes mellitus with diabetic neuropathy, unspecified: Secondary | ICD-10-CM | POA: Diagnosis not present

## 2016-01-13 DIAGNOSIS — M79606 Pain in leg, unspecified: Secondary | ICD-10-CM | POA: Diagnosis present

## 2016-01-13 DIAGNOSIS — Z96641 Presence of right artificial hip joint: Secondary | ICD-10-CM | POA: Insufficient documentation

## 2016-01-13 DIAGNOSIS — E11621 Type 2 diabetes mellitus with foot ulcer: Secondary | ICD-10-CM | POA: Diagnosis not present

## 2016-01-13 DIAGNOSIS — M5135 Other intervertebral disc degeneration, thoracolumbar region: Secondary | ICD-10-CM

## 2016-01-13 DIAGNOSIS — M160 Bilateral primary osteoarthritis of hip: Secondary | ICD-10-CM

## 2016-01-13 DIAGNOSIS — M47817 Spondylosis without myelopathy or radiculopathy, lumbosacral region: Secondary | ICD-10-CM | POA: Diagnosis not present

## 2016-01-13 DIAGNOSIS — M545 Low back pain: Secondary | ICD-10-CM | POA: Diagnosis present

## 2016-01-13 DIAGNOSIS — M1711 Unilateral primary osteoarthritis, right knee: Secondary | ICD-10-CM | POA: Insufficient documentation

## 2016-01-13 DIAGNOSIS — M533 Sacrococcygeal disorders, not elsewhere classified: Secondary | ICD-10-CM | POA: Diagnosis not present

## 2016-01-13 DIAGNOSIS — L97519 Non-pressure chronic ulcer of other part of right foot with unspecified severity: Secondary | ICD-10-CM

## 2016-01-13 DIAGNOSIS — E0840 Diabetes mellitus due to underlying condition with diabetic neuropathy, unspecified: Secondary | ICD-10-CM

## 2016-01-13 MED ORDER — METHADONE HCL 10 MG PO TABS: ORAL_TABLET | ORAL | 0 refills | Status: DC

## 2016-01-13 MED ORDER — OXYCODONE HCL 5 MG PO TABS: ORAL_TABLET | ORAL | 0 refills | Status: DC

## 2016-01-13 NOTE — Progress Notes (Signed)
     The patient is a 55 year old female who returns to pain management for further evaluation and treatment of pain involving the lower back and lower extremity region predominantly with pain occurring in the region of the right hip and the right knee. The patient also has pain involving the hands. The patient is status post right total hip replacement and has significant degenerative changes of the right knee. At the present time patient states the pain is well-controlled and denies any trauma change in events of daily living the call significant change in symptomatology. We will continue presently prescribed medications and we will remain available to consider modification of treatment regimen including interventional treatment should they be significant change in patient's condition. The patient is able to perform activities of daily living as well as obtaining restful sleep without significant pain the patient denies any ulcerations of the feet of significant pain of the right total hip replacement or the degenerative right knee. We will continue medications and we will remain available to consider modifications of treatment regimen pending follow-up evaluation. All agreed with suggested treatment plan.     Assessment   Diabetes mellitus with diabetic neuropathy  Diabetic foot ulcer  Degenerative disc disease lumbar spine Multilevel degenerative changes of the lumbar spine with L4-L5 level involvement most significantly  Lumbar facet syndrome  Status post right total hip replacement  Degenerative joint disease right knee    Plan   Continue present medications oxycodone and methadone  F/U PCP Dr. Caryn Section for evaluation of  BP kidney infection  diabetes mellitus and general medical  condition.. As we discussed you may wish to mention medication for arthritis of the hands when you see Dr. Caryn Section  F/U wound care clinic evaluation as needed  F/U surgical evaluation. Patient without  plans for surgical intervention at this time  F/U with Dr. Venia Minks regarding index finger of the right hand we may obtain x-ray, MRI and other studies pending follow-up evaluation. We may also consider rheumatological evaluation  F/U podiatrist as discussed  F/U neurological evaluation. May consider  May consider radiofrequency rhizolysis or intraspinal procedures pending response to present treatment and F/U evaluation.. We will avoid such treatment at this time  Patient to call Pain Management Center should patient have concerns prior to scheduled return appointment

## 2016-01-13 NOTE — Patient Instructions (Addendum)
Plan   Continue present medications oxycodone and methadone  F/U PCP Dr. Caryn Section for evaluation of  BP kidney infection  diabetes mellitus and general medical  condition.. As we discussed you may wish to mention medication for arthritis of the hands when you see Dr. Caryn Section  F/U wound care clinic evaluation as needed  F/U surgical evaluation. Patient without plans for surgical intervention at this time  F/U with Dr. Venia Minks regarding index finger of the right hand we may obtain x-ray, MRI and other studies pending follow-up evaluation. We may also consider rheumatological evaluation  F/U podiatrist as discussed  F/U neurological evaluation. May consider  May consider radiofrequency rhizolysis or intraspinal procedures pending response to present treatment and F/U evaluation.. We will avoid such treatment at this time  Patient to call Pain Management Center should patient have concerns prior to scheduled return appointment

## 2016-01-13 NOTE — Progress Notes (Signed)
Safety precautions to be maintained throughout the outpatient stay will include: orient to surroundings, keep bed in low position, maintain call bell within reach at all times, provide assistance with transfer out of bed and ambulation.  

## 2016-02-01 ENCOUNTER — Telehealth: Payer: Self-pay | Admitting: *Deleted

## 2016-02-13 ENCOUNTER — Other Ambulatory Visit: Payer: Self-pay | Admitting: Pain Medicine

## 2016-02-14 ENCOUNTER — Ambulatory Visit: Payer: Medicare Other | Admitting: Pain Medicine

## 2016-02-14 DIAGNOSIS — M47817 Spondylosis without myelopathy or radiculopathy, lumbosacral region: Secondary | ICD-10-CM | POA: Diagnosis not present

## 2016-02-14 DIAGNOSIS — M791 Myalgia: Secondary | ICD-10-CM | POA: Diagnosis not present

## 2016-02-14 DIAGNOSIS — M533 Sacrococcygeal disorders, not elsewhere classified: Secondary | ICD-10-CM | POA: Diagnosis not present

## 2016-02-14 DIAGNOSIS — M5416 Radiculopathy, lumbar region: Secondary | ICD-10-CM | POA: Diagnosis not present

## 2016-03-21 ENCOUNTER — Telehealth: Payer: Self-pay | Admitting: Family Medicine

## 2016-03-21 DIAGNOSIS — M47817 Spondylosis without myelopathy or radiculopathy, lumbosacral region: Secondary | ICD-10-CM | POA: Diagnosis not present

## 2016-03-21 DIAGNOSIS — M791 Myalgia: Secondary | ICD-10-CM | POA: Diagnosis not present

## 2016-03-21 DIAGNOSIS — M5416 Radiculopathy, lumbar region: Secondary | ICD-10-CM | POA: Diagnosis not present

## 2016-03-21 DIAGNOSIS — M533 Sacrococcygeal disorders, not elsewhere classified: Secondary | ICD-10-CM | POA: Diagnosis not present

## 2016-03-21 NOTE — Telephone Encounter (Signed)
Called Pt to schedule AWV with [facility] - knb °

## 2016-04-03 ENCOUNTER — Telehealth: Payer: Self-pay | Admitting: Physician Assistant

## 2016-04-03 DIAGNOSIS — F419 Anxiety disorder, unspecified: Secondary | ICD-10-CM

## 2016-04-03 MED ORDER — LORAZEPAM 1 MG PO TABS: 1.0000 mg | ORAL_TABLET | Freq: Two times a day (BID) | ORAL | 5 refills | Status: DC | PRN

## 2016-04-03 NOTE — Telephone Encounter (Signed)
Rx for Lorazepam was called in to [facility].

## 2016-04-03 NOTE — Telephone Encounter (Signed)
Ok to phone in Lorazepam 1mg  Take 1 tab PO BID prn anxiety #60 5 RF

## 2016-04-11 DIAGNOSIS — M533 Sacrococcygeal disorders, not elsewhere classified: Secondary | ICD-10-CM | POA: Diagnosis not present

## 2016-04-11 DIAGNOSIS — M5416 Radiculopathy, lumbar region: Secondary | ICD-10-CM | POA: Diagnosis not present

## 2016-04-11 DIAGNOSIS — M47817 Spondylosis without myelopathy or radiculopathy, lumbosacral region: Secondary | ICD-10-CM | POA: Diagnosis not present

## 2016-04-11 DIAGNOSIS — M791 Myalgia: Secondary | ICD-10-CM | POA: Diagnosis not present

## 2016-05-09 DIAGNOSIS — M791 Myalgia: Secondary | ICD-10-CM | POA: Diagnosis not present

## 2016-05-09 DIAGNOSIS — M533 Sacrococcygeal disorders, not elsewhere classified: Secondary | ICD-10-CM | POA: Diagnosis not present

## 2016-05-09 DIAGNOSIS — M5416 Radiculopathy, lumbar region: Secondary | ICD-10-CM | POA: Diagnosis not present

## 2016-05-09 DIAGNOSIS — M47817 Spondylosis without myelopathy or radiculopathy, lumbosacral region: Secondary | ICD-10-CM | POA: Diagnosis not present

## 2016-06-12 ENCOUNTER — Other Ambulatory Visit: Payer: Self-pay

## 2016-06-12 ENCOUNTER — Other Ambulatory Visit: Payer: Self-pay | Admitting: Pain Medicine

## 2016-06-12 DIAGNOSIS — M47817 Spondylosis without myelopathy or radiculopathy, lumbosacral region: Secondary | ICD-10-CM | POA: Diagnosis not present

## 2016-06-12 DIAGNOSIS — M533 Sacrococcygeal disorders, not elsewhere classified: Secondary | ICD-10-CM | POA: Diagnosis not present

## 2016-06-12 DIAGNOSIS — E0840 Diabetes mellitus due to underlying condition with diabetic neuropathy, unspecified: Secondary | ICD-10-CM

## 2016-06-12 DIAGNOSIS — M791 Myalgia: Secondary | ICD-10-CM | POA: Diagnosis not present

## 2016-06-12 DIAGNOSIS — M5416 Radiculopathy, lumbar region: Secondary | ICD-10-CM | POA: Diagnosis not present

## 2016-06-12 DIAGNOSIS — Z794 Long term (current) use of insulin: Principal | ICD-10-CM

## 2016-06-12 MED ORDER — INSULIN GLARGINE 100 UNIT/ML ~~LOC~~ SOLN: 100.0000 [IU] | Freq: Two times a day (BID) | SUBCUTANEOUS | 0 refills | Status: DC

## 2016-06-12 NOTE — Telephone Encounter (Signed)
Pharmacy requesting refills. Patient is DUE for an OV. Last OV was 05/11/2015 for DM with Dr. Venia Minks. Thanks!

## 2016-06-17 ENCOUNTER — Emergency Department
Admission: EM | Admit: 2016-06-17 | Discharge: 2016-06-17 | Disposition: A | Discharge status: Home / Self Care | Payer: Medicare Other | Attending: Student in an Organized Health Care Education/Training Program | Admitting: Student in an Organized Health Care Education/Training Program

## 2016-06-17 ENCOUNTER — Emergency Department: Payer: Medicare Other

## 2016-06-17 DIAGNOSIS — E119 Type 2 diabetes mellitus without complications: Secondary | ICD-10-CM | POA: Diagnosis not present

## 2016-06-17 DIAGNOSIS — Z794 Long term (current) use of insulin: Secondary | ICD-10-CM | POA: Insufficient documentation

## 2016-06-17 DIAGNOSIS — I10 Essential (primary) hypertension: Secondary | ICD-10-CM | POA: Diagnosis not present

## 2016-06-17 DIAGNOSIS — R109 Unspecified abdominal pain: Secondary | ICD-10-CM | POA: Diagnosis not present

## 2016-06-17 DIAGNOSIS — R350 Frequency of micturition: Secondary | ICD-10-CM | POA: Diagnosis not present

## 2016-06-17 DIAGNOSIS — Z79899 Other long term (current) drug therapy: Secondary | ICD-10-CM | POA: Diagnosis not present

## 2016-06-17 DIAGNOSIS — R300 Dysuria: Secondary | ICD-10-CM | POA: Diagnosis not present

## 2016-06-17 DIAGNOSIS — Z7982 Long term (current) use of aspirin: Secondary | ICD-10-CM | POA: Insufficient documentation

## 2016-06-17 DIAGNOSIS — F17210 Nicotine dependence, cigarettes, uncomplicated: Secondary | ICD-10-CM | POA: Insufficient documentation

## 2016-06-17 LAB — URINALYSIS, COMPLETE (UACMP) WITH MICROSCOPIC
Bacteria, UA: NONE SEEN
Bilirubin Urine: NEGATIVE
Glucose, UA: 50 mg/dL — AB
KETONES UR: NEGATIVE mg/dL
Leukocytes, UA: NEGATIVE
Nitrite: NEGATIVE
Protein, ur: 100 mg/dL — AB
Specific Gravity, Urine: 1.01 (ref 1.005–1.030)
pH: 5 (ref 5.0–8.0)

## 2016-06-17 LAB — COMPREHENSIVE METABOLIC PANEL
ALBUMIN: 3.5 g/dL (ref 3.5–5.0)
ALT: 18 U/L (ref 14–54)
AST: 17 U/L (ref 15–41)
Alkaline Phosphatase: 62 U/L (ref 38–126)
Anion gap: 4 — ABNORMAL LOW (ref 5–15)
BUN: 17 mg/dL (ref 6–20)
CHLORIDE: 96 mmol/L — AB (ref 101–111)
CO2: 34 mmol/L — ABNORMAL HIGH (ref 22–32)
Calcium: 8.7 mg/dL — ABNORMAL LOW (ref 8.9–10.3)
Creatinine, Ser: 0.67 mg/dL (ref 0.44–1.00)
GFR calc Af Amer: >60 mL/min (ref >60)
GFR calc non Af Amer: >60 mL/min (ref >60)
GLUCOSE: 151 mg/dL — AB (ref 65–99)
POTASSIUM: 4.5 mmol/L (ref 3.5–5.1)
SODIUM: 134 mmol/L — AB (ref 135–145)
Total Bilirubin: 0.6 mg/dL (ref 0.3–1.2)
Total Protein: 7.6 g/dL (ref 6.5–8.1)

## 2016-06-17 LAB — CBC WITH DIFFERENTIAL/PLATELET
BASOS ABS: 0.1 10*3/uL (ref 0–0.1)
BASOS PCT: 1 %
EOS PCT: 2 %
Eosinophils Absolute: 0.3 10*3/uL (ref 0–0.7)
HCT: 48.4 % — ABNORMAL HIGH (ref 35.0–47.0)
Hemoglobin: 16.2 g/dL — ABNORMAL HIGH (ref 12.0–16.0)
Lymphocytes Relative: 37 %
Lymphs Abs: 4.2 10*3/uL — ABNORMAL HIGH (ref 1.0–3.6)
MCH: 27.5 pg (ref 26.0–34.0)
MCHC: 33.5 g/dL (ref 32.0–36.0)
MCV: 82.2 fL (ref 80.0–100.0)
MONO ABS: 0.9 10*3/uL (ref 0.2–0.9)
Monocytes Relative: 8 %
NEUTROS ABS: 5.8 10*3/uL (ref 1.4–6.5)
Neutrophils Relative %: 52 %
PLATELETS: 146 10*3/uL — AB (ref 150–440)
RBC: 5.89 MIL/uL — ABNORMAL HIGH (ref 3.80–5.20)
RDW: 16.2 % — AB (ref 11.5–14.5)
WBC: 11.3 10*3/uL — ABNORMAL HIGH (ref 3.6–11.0)

## 2016-06-17 MED ORDER — CEPHALEXIN 500 MG PO CAPS
500.0000 mg | ORAL_CAPSULE | Freq: Three times a day (TID) | ORAL | 0 refills | Status: DC
Start: 2016-06-17 — End: 2016-06-23

## 2016-06-17 MED ORDER — SODIUM CHLORIDE 0.9 % IV BOLUS (SEPSIS)
1000.0000 mL | Freq: Once | INTRAVENOUS | Status: AC
  Administered 2016-06-17: 1000 mL via INTRAVENOUS

## 2016-06-17 MED ORDER — HYDROCODONE-ACETAMINOPHEN 5-325 MG PO TABS
2.0000 | ORAL_TABLET | Freq: Once | ORAL | Status: AC
  Administered 2016-06-17: 2 via ORAL
  Filled 2016-06-17: qty 2

## 2016-06-17 MED ORDER — CEPHALEXIN 500 MG PO CAPS
500.0000 mg | ORAL_CAPSULE | Freq: Once | ORAL | Status: AC
  Administered 2016-06-17: 500 mg via ORAL
  Filled 2016-06-17: qty 1

## 2016-06-17 NOTE — ED Notes (Signed)
Report given to Rachel, RN.

## 2016-06-17 NOTE — ED Provider Notes (Signed)
[facility] Emergency Department Provider Note    First MD Initiated Contact with Patient 06/17/16 1702     (approximate)  I have reviewed the triage vital signs and the nursing notes.   HISTORY  Chief Complaint Flank Pain and Urinary Frequency    HPI Amy Meyers is a 56 y.o. female with history of diabetes presents with 4 days of right flank pain and burning with urination and crit increased urinary frequency. States the pain is colicky in nature. Currently states that it's 3 out of 10 in severity. States that during episodes will come up to 10 out of 10 in severity. Has felt febrile but no measured temperatures. No associated nausea or vomiting. Denies any abdominal pain. No shortness of breath or chest pain. Denies any hematuria but states that the urine is darker in color. No previous history of kidney stones.   Past Medical History:  Diagnosis Date  . Anxiety   . Arthritis   . Diabetes mellitus without complication ([place])   . Open wound of right foot   . Staph infection    right foot   Family History  Problem Relation Age of Onset  . Alcohol abuse Mother   . Arthritis Mother   . COPD Mother   . Depression Mother   . Diabetes Mother   . Alcohol abuse Father   . Depression Father   . Early death Father   . Learning disabilities Father   . Alcohol abuse Brother   . Arthritis Brother   . Diabetes Maternal Grandmother   . Varicose Veins Paternal Grandmother   . Diabetes Maternal Grandfather   . Alcohol abuse Paternal Grandfather    Past Surgical History:  Procedure Laterality Date  . ABDOMINAL ADHESION SURGERY    . ABDOMINAL HYSTERECTOMY  1998   with oophorectomy due to adhesions  . CARPAL TUNNEL RELEASE Bilateral   . [place]   x 2   . CHOLECYSTECTOMY  1981  . FOOT SURGERY Left   . JOINT REPLACEMENT     right hip replacement  . TONSILLECTOMY  1980  . TONSILLECTOMY    . upper GI X ray series  06/04/2001   Hiatal Hernia   Patient Active Problem List   Diagnosis Date Noted  . Callous ulcer ([place]) 05/11/2015  . Anxiety 04/13/2015  . Arthritis, degenerative 04/06/2015  . Neuropathy ([place]) 04/06/2015  . Diabetic foot ulcer ([place]) 04/06/2015  . Back pain, chronic 04/06/2015  . Abnormal ECG 03/17/2015  . Hypertension 03/17/2015  . NASH (nonalcoholic steatohepatitis) 03/17/2015  . Obesity 03/17/2015  . Palpitations 03/17/2015  . Pulmonary edema 03/17/2015  . PVC's (premature ventricular contractions) 03/17/2015  . Breath shortness 03/17/2015  . Headache, tension-type 03/17/2015  . Compulsive tobacco user syndrome 03/17/2015  . Diabetes mellitus, type 2 ([place]) 03/11/2015  . Type 2 diabetes mellitus with right diabetic foot ulcer ([place]) 01/06/2015  . Facet syndrome, lumbar 11/26/2014  . Sacroiliac joint dysfunction 11/26/2014  . DDD (degenerative disc disease), thoracolumbar 09/29/2014  . Diabetes mellitus due to underlying condition with diabetic neuropathy ([place]) 09/29/2014  . 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 Osteomyelitis of lower leg ([place]) 03/11/2014  . Current tobacco use 12/19/2013  . Systemic inflammatory response syndrome (SIRS) ([place]) 12/16/2013  . Arthritis 12/16/2013      Prior to Admission medications   Medication Sig Start Date End Date Taking? Authorizing Provider  aspirin 325 MG tablet Take  325 mg by mouth daily. Reported on 07/30/2015    Historical Provider, MD  cephALEXin (KEFLEX) 500 MG capsule Take 1 capsule (500 mg total) by mouth 3 (three) times daily. Patient not taking: Reported on 12/15/2015 11/13/15   Harvest Dark, MD  cephALEXin (KEFLEX) 500 MG capsule Take 1 capsule (500 mg total) by mouth 3 (three) times daily. 06/17/16 06/27/16  Merlyn Lot, MD  insulin aspart (NOVOLOG) 100 UNIT/ML injection Inject 20 Units into the skin 3 (three) times daily with meals. Sliding scale. If blood sugar between 200-399 administer  20 units, 400 and over use 40 unitsInject into the skin. Sliding scale. If blood sugar between 200-399 administer 20 units, 400 and over use 40 units 04/06/15   Margarita Rana, MD  insulin glargine (LANTUS) 100 UNIT/ML injection Inject 1 mL (100 Units total) into the skin 2 (two) times daily. PATIENT NEEDS TO SCHEDULE OFFICE VISIT FOR FOLLOW UP 06/12/16   Birdie Sons, MD  LORazepam (ATIVAN) 1 MG tablet Take 1 tablet (1 mg total) by mouth 2 (two) times daily as needed for anxiety. 04/03/16   Mar Daring, [name]  methadone (DOLOPHINE) 10 MG tablet Limit 5-7 tabs by mouth per day if tolerated 01/13/16   Mohammed Kindle, MD  Omega-3 Fatty Acids (FISH OIL) 1000 MG CAPS Take by mouth 2 (two) times daily.    Historical Provider, MD  ondansetron (ZOFRAN ODT) 4 MG disintegrating tablet Take 1 tablet (4 mg total) by mouth every 8 (eight) hours as needed for nausea or vomiting. 11/13/15   Harvest Dark, MD  oxyCODONE (OXY IR/ROXICODONE) 5 MG immediate release tablet Limit 4 - 6 tabs po / day for breakthrough pain while taking methadone if tolerated 01/13/16   Mohammed Kindle, MD  promethazine (PHENERGAN) 25 MG tablet Take 1 tablet (25 mg total) by mouth every 6 (six) hours as needed for nausea or vomiting. 11/13/15   Harvest Dark, MD    Allergies Meloxicam and Sulfa antibiotics    Social History Social History  Substance Use Topics  . Smoking status: Current Every Day Smoker    Packs/day: 1.00    Years: 44.00    Types: Cigarettes  . Smokeless tobacco: Current User  . Alcohol use No    Review of Systems Patient denies headaches, rhinorrhea, blurry vision, numbness, shortness of breath, chest pain, edema, cough, abdominal pain, nausea, vomiting, diarrhea, dysuria, fevers, rashes or hallucinations unless otherwise stated above in HPI. ____________________________________________   PHYSICAL EXAM:  VITAL SIGNS: Vitals:   06/17/16 1429 06/17/16 1927  BP: (!) 148/73 109/68  Pulse: 96 88    Resp: 16 16  Temp: 98.4 F (36.9 C) 98.3 F (36.8 C)    Constitutional: Alert and oriented. Well appearing and in no acute distress. Eyes: Conjunctivae are normal. PERRL. EOMI. Head: Atraumatic. Nose: No congestion/rhinnorhea. Mouth/Throat: Mucous membranes are moist.  Oropharynx non-erythematous. Neck: No stridor. Painless ROM. No cervical spine tenderness to palpation Hematological/Lymphatic/Immunilogical: No cervical lymphadenopathy. Cardiovascular: Normal rate, regular rhythm. Grossly normal heart sounds.  Good peripheral circulation. Respiratory: Normal respiratory effort.  No retractions. Lungs CTAB. Gastrointestinal: Soft and nontender. No distention. No abdominal bruits. + right CVA tenderness. Genitourinary:  Musculoskeletal: No lower extremity tenderness nor edema.  No joint effusions. Neurologic:  Normal speech and language. No gross focal neurologic deficits are appreciated. No gait instability. Skin:  Skin is warm, dry and intact. No rash noted. Psychiatric: Mood and affect are normal. Speech and behavior are normal.  ____________________________________________   LABS (all labs  ordered are listed, but only abnormal results are displayed)  Results for orders placed or performed during the hospital encounter of 06/17/16 (from the past 24 hour(s))  Urinalysis, Complete w Microscopic     Status: Abnormal   Collection Time: 06/17/16  2:38 PM  Result Value Ref Range   Color, Urine YELLOW (A) YELLOW   APPearance CLEAR (A) CLEAR   Specific Gravity, Urine 1.010 1.005 - 1.030   pH 5.0 5.0 - 8.0   Glucose, UA 50 (A) NEGATIVE mg/dL   Hgb urine dipstick SMALL (A) NEGATIVE   Bilirubin Urine NEGATIVE NEGATIVE   Ketones, ur NEGATIVE NEGATIVE mg/dL   Protein, ur 100 (A) NEGATIVE mg/dL   Nitrite NEGATIVE NEGATIVE   Leukocytes, UA NEGATIVE NEGATIVE   RBC / HPF 0-5 0 - 5 RBC/hpf   WBC, UA 0-5 0 - 5 WBC/hpf   Bacteria, UA NONE SEEN NONE SEEN   Squamous Epithelial / LPF 0-5 (A)  NONE SEEN   Hyaline Casts, UA PRESENT   CBC with Differential/Platelet     Status: Abnormal   Collection Time: 06/17/16  5:55 PM  Result Value Ref Range   WBC 11.3 (H) 3.6 - 11.0 K/uL   RBC 5.89 (H) 3.80 - 5.20 MIL/uL   Hemoglobin 16.2 (H) 12.0 - 16.0 g/dL   HCT 48.4 (H) 35.0 - 47.0 %   MCV 82.2 80.0 - 100.0 fL   MCH 27.5 26.0 - 34.0 pg   MCHC 33.5 32.0 - 36.0 g/dL   RDW 16.2 (H) 11.5 - 14.5 %   Platelets 146 (L) 150 - 440 K/uL   Neutrophils Relative % 52 %   Neutro Abs 5.8 1.4 - 6.5 K/uL   Lymphocytes Relative 37 %   Lymphs Abs 4.2 (H) 1.0 - 3.6 K/uL   Monocytes Relative 8 %   Monocytes Absolute 0.9 0.2 - 0.9 K/uL   Eosinophils Relative 2 %   Eosinophils Absolute 0.3 0 - 0.7 K/uL   Basophils Relative 1 %   Basophils Absolute 0.1 0 - 0.1 K/uL  Comprehensive metabolic panel     Status: Abnormal   Collection Time: 06/17/16  5:55 PM  Result Value Ref Range   Sodium 134 (L) 135 - 145 mmol/L   Potassium 4.5 3.5 - 5.1 mmol/L   Chloride 96 (L) 101 - 111 mmol/L   CO2 34 (H) 22 - 32 mmol/L   Glucose, Bld 151 (H) 65 - 99 mg/dL   BUN 17 6 - 20 mg/dL   Creatinine, Ser 0.67 0.44 - 1.00 mg/dL   Calcium 8.7 (L) 8.9 - 10.3 mg/dL   Total Protein 7.6 6.5 - 8.1 g/dL   Albumin 3.5 3.5 - 5.0 g/dL   AST 17 15 - 41 U/L   ALT 18 14 - 54 U/L   Alkaline Phosphatase 62 38 - 126 U/L   Total Bilirubin 0.6 0.3 - 1.2 mg/dL   GFR calc non Af Amer >60 >60 mL/min   GFR calc Af Amer >60 >60 mL/min   Anion gap 4 (L) 5 - 15   ____________________________________________  EKG____________________________________________  RADIOLOGY  I personally reviewed all radiographic images ordered to evaluate for the above acute complaints and reviewed radiology reports and findings.  These findings were personally discussed with the patient.  Please see medical record for radiology report.  ____________________________________________   PROCEDURES  Procedure(s) performed:  Procedures    Critical Care  performed: no ____________________________________________   INITIAL IMPRESSION / ASSESSMENT AND PLAN / ED COURSE  Pertinent labs & imaging results that were available during my care of the patient were reviewed by me and considered in my medical decision making (see chart for details).  DDX: stone, pyelo, diverticulitis, uti, msk strain  TEE NEVE is a 56 y.o. who presents to the ED p/w righ flank pain. + fevers and chills.  + urinary symptoms. Denies trauma or injury. Afebrile in ED. Exam as above. Flank TTP, otherwise abdominal exam is benign. No peritoneal signs. Possible kidney stone, cystitis, or pyelonephritis.   Checking urine. UA with no evidence of significant bacturia or blood CT Stone with no hydronephrosis or stone.  Clinical picture is not consistent with appendicitis, diverticulitis, pancreatitis, cholecystitis, bowel perforation, aortic dissection, splenic injury or acute abdominal process at this time.   Clinical Course as of Jun 17 2005  Sat Jun 17, 2016  [place] Blood work is reassuring. CT scan shows no evidence of acute stone or hydronephrosis. Discussed the incidental findings with patient. We'll treat for pyelonephritis.  Pain improved, tolerating PO. Repeat ABD exam benign, will plan supportive treatment and early follow up for recheck.  [PR]    Clinical Course User Index [PR] Merlyn Lot, MD     ____________________________________________   FINAL CLINICAL IMPRESSION(S) / ED DIAGNOSES  Final diagnoses:  Right flank pain  Dysuria  Urinary frequency      NEW MEDICATIONS STARTED DURING THIS VISIT:  Discharge Medication List as of 06/17/2016  6:53 PM    START taking these medications   Details  !! cephALEXin (KEFLEX) 500 MG capsule Take 1 capsule (500 mg total) by mouth 3 (three) times daily., Starting Sat 06/17/2016, Until Tue 06/27/2016, Print     !! - Potential duplicate medications found. Please discuss with provider.       Note:   This document was prepared using Dragon voice recognition software and may include unintentional dictation errors.    Merlyn Lot, MD 06/17/16 2009

## 2016-06-17 NOTE — ED Triage Notes (Signed)
Pt reports urinary frequency, burning and right sided lower back/flank pain X 4 days. Pt alert and oriented X4, active, cooperative, pt in NAD. RR even and unlabored, color WNL.

## 2016-06-17 NOTE — ED Notes (Signed)
Pt noted to be resting in bed in hallway at this time. NAD Noted. Pt's daughter remains at bedside at this time. Will continue to monitor for further patient needs at this time.

## 2016-06-23 ENCOUNTER — Ambulatory Visit (INDEPENDENT_AMBULATORY_CARE_PROVIDER_SITE_OTHER): Payer: Medicare Other

## 2016-06-23 VITALS — BP 130/82 | HR 88 | Temp 99.1°F | Ht 66.0 in | Wt 229.6 lb

## 2016-06-23 DIAGNOSIS — Z13820 Encounter for screening for osteoporosis: Secondary | ICD-10-CM

## 2016-06-23 DIAGNOSIS — Z0000 Encounter for general adult medical examination without abnormal findings: Secondary | ICD-10-CM

## 2016-06-23 NOTE — Patient Instructions (Signed)

## 2016-06-23 NOTE — Progress Notes (Signed)
Subjective:   Amy Meyers is a 56 y.o. female who presents for Medicare Annual (Subsequent) preventive examination.  Review of Systems:  N/A  Cardiac Risk Factors include: diabetes mellitus;hypertension;obesity (BMI >30kg/m2);smoking/ tobacco exposure     Objective:     Vitals: BP 130/82 (BP Location: Right Arm)   Pulse 88   Temp 99.1 F (37.3 C) (Oral)   Ht 5\' 6"  (1.676 m)   Wt 229 lb 9.6 oz (104.1 kg)   BMI 37.06 kg/m   Body mass index is 37.06 kg/m.   Tobacco History  Smoking Status  . Current Every Day Smoker  . Packs/day: 1.00  . Years: 44.00  . Types: Cigarettes  Smokeless Tobacco  . Current User  . Types: Chew     Ready to quit: Not Answered Counseling given: Not Answered   Past Medical History:  Diagnosis Date  . Anxiety   . Arthritis   . Diabetes mellitus without complication ([place])   . Hypertension   . Kidney infection   . Open wound of right foot   . Staph infection    right foot   Past Surgical History:  Procedure Laterality Date  . ABDOMINAL ADHESION SURGERY    . ABDOMINAL HYSTERECTOMY  1998   with oophorectomy due to adhesions  . CARPAL TUNNEL RELEASE Bilateral   . High [place]   x 2   . CHOLECYSTECTOMY  1981  . FOOT SURGERY Left   . JOINT REPLACEMENT     right hip replacement  . TONSILLECTOMY  1980  . TONSILLECTOMY    . upper GI X ray series  06/04/2001   Hiatal Hernia   Family History  Problem Relation Age of Onset  . Alcohol abuse Mother   . Arthritis Mother   . COPD Mother   . Depression Mother   . Diabetes Mother   . Alcohol abuse Father   . Depression Father   . Early death Father   . Learning disabilities Father   . Alcohol abuse Brother   . Arthritis Brother   . Diabetes Maternal Grandmother   . Varicose Veins Paternal Grandmother   . Diabetes Maternal Grandfather   . Alcohol abuse Paternal Grandfather    History  Sexual Activity  . Sexual activity: Not on file    Outpatient Encounter  Prescriptions as of 06/23/2016  Medication Sig  . aspirin 325 MG tablet Take 325 mg by mouth daily. Reported on 07/30/2015  . cephALEXin (KEFLEX) 500 MG capsule Take 1 capsule (500 mg total) by mouth 3 (three) times daily.  . insulin aspart (NOVOLOG) 100 UNIT/ML injection Inject 20 Units into the skin 3 (three) times daily with meals. Sliding scale. If blood sugar between 200-399 administer 20 units, 400 and over use 40 unitsInject into the skin. Sliding scale. If blood sugar between 200-399 administer 20 units, 400 and over use 40 units  . insulin glargine (LANTUS) 100 UNIT/ML injection Inject 1 mL (100 Units total) into the skin 2 (two) times daily. PATIENT NEEDS TO SCHEDULE OFFICE VISIT FOR FOLLOW UP  . LORazepam (ATIVAN) 1 MG tablet Take 1 tablet (1 mg total) by mouth 2 (two) times daily as needed for anxiety.  . methadone (DOLOPHINE) 10 MG tablet Limit 5-7 tabs by mouth per day if tolerated (Patient taking differently: Take 10 mg by mouth. Limit 5-7 tabs by mouth per day if tolerated)  . ondansetron (ZOFRAN ODT) 4 MG disintegrating tablet Take 1 tablet (4 mg total) by  mouth every 8 (eight) hours as needed for nausea or vomiting.  Marland Kitchen oxyCODONE (OXY IR/ROXICODONE) 5 MG immediate release tablet Limit 4 - 6 tabs po / day for breakthrough pain while taking methadone if tolerated  . promethazine (PHENERGAN) 25 MG tablet Take 1 tablet (25 mg total) by mouth every 6 (six) hours as needed for nausea or vomiting.  . Omega-3 Fatty Acids (FISH OIL) 1000 MG CAPS Take by mouth 2 (two) times daily.  . [DISCONTINUED] cephALEXin (KEFLEX) 500 MG capsule Take 1 capsule (500 mg total) by mouth 3 (three) times daily. (Patient not taking: Reported on 06/23/2016)   No facility-administered encounter medications on file as of 06/23/2016.     Activities of Daily Living In your present state of health, do you have any difficulty performing the following activities: 06/23/2016  Hearing? N  Vision? Y  Difficulty  concentrating or making decisions? N  Walking or climbing stairs? Y  Dressing or bathing? N  Doing errands, shopping? Y  Preparing Food and eating ? N  Using the Toilet? N  In the past six months, have you accidently leaked urine? Y  Do you have problems with loss of bowel control? N  Managing your Medications? N  Managing your Finances? N  Housekeeping or managing your Housekeeping? N  Some recent data might be hidden    Patient Care Team: Birdie Sons, MD as PCP - General (Family Medicine) Mohammed Kindle, MD as Attending Physician (Pain Medicine) Samara Deist, DPM as Referring Physician (Podiatry)    Assessment:     Exercise Activities and Dietary recommendations Current Exercise Habits: The patient does not participate in regular exercise at present, Exercise limited by: None identified (dont have time)  Goals    . Increase water intake          Starting 2018, I will continue to drink 5 glasses of water a day.      Fall Risk Fall Risk  06/23/2016 01/13/2016 12/15/2015 11/16/2015 10/12/2015  Falls in the past year? No No No No No   Depression Screen PHQ 2/9 Scores 06/23/2016 01/13/2016 11/16/2015 10/12/2015  PHQ - 2 Score 3 0 0 0  PHQ- 9 Score 12 - - -  Exception Documentation - - - -     Cognitive Function     6CIT Screen 06/23/2016  What Year? 0 points  What month? 0 points  What time? 0 points  Count back from 20 0 points  Months in reverse 0 points  Repeat phrase 2 points  Total Score 2     There is no immunization history on file for this patient. Screening Tests Health Maintenance  Topic Date Due  . PNEUMOCOCCAL POLYSACCHARIDE VACCINE (1) 06/29/2016 (Originally 09/04/1962)  . INFLUENZA VACCINE  06/29/2016 (Originally 12/28/2015)  . PAP SMEAR  06/29/2016 (Originally 09/05/1979)  . HEMOGLOBIN A1C  06/29/2016 (Originally 10/04/2015)  . URINE MICROALBUMIN  06/29/2016 (Originally 09/04/1968)  . MAMMOGRAM  10/27/2016 (Originally 09/04/2008)  . OPHTHALMOLOGY EXAM   10/27/2016 (Originally 09/04/1968)  . COLONOSCOPY  10/27/2016 (Originally 09/04/2008)  . TETANUS/TDAP  05/29/2017 (Originally 09/04/1977)  . FOOT EXAM  06/27/2016  . Hepatitis C Screening  Completed  . HIV Screening  Completed      Plan:  I have personally reviewed and addressed the Medicare Annual Wellness questionnaire and have noted the following in the patient's chart:  A. Medical and social history B. Use of alcohol, tobacco or illicit drugs  C. Current medications and supplements D. Functional ability and status  E.  Nutritional status F.  Physical activity G. Advance directives H. List of other physicians I.  Hospitalizations, surgeries, and ER visits in previous 12 months J.  [place] such as hearing and vision if needed, cognitive and depression L. Referrals and appointments - none  In addition, I have reviewed and discussed with patient certain preventive protocols, quality metrics, and best practice recommendations. A written personalized care plan for preventive services as well as general preventive health recommendations were provided to patient.  See attached scanned questionnaire for additional information.   Signed,  Fabio Neighbors, LPN Nurse Health Advisor   MD Recommendations: Follow up on results from colonoscopy. Needs blood work (urine and A1c). Pt declined vaccines today due to not feeling well. Pt to schedule eye exam.   I have reviewed the documentation and information obtained by Fabio Neighbors, LPN in the above chart and agree as above. I was available for consultation if any questions or issues arose.  Fenton Malling, [name]

## 2016-06-24 ENCOUNTER — Encounter: Payer: Self-pay | Admitting: Family Medicine

## 2016-06-24 ENCOUNTER — Ambulatory Visit (INDEPENDENT_AMBULATORY_CARE_PROVIDER_SITE_OTHER): Payer: Medicare Other | Admitting: Family Medicine

## 2016-06-24 VITALS — BP 130/72 | HR 87 | Temp 98.2°F | Resp 16 | Wt 232.0 lb

## 2016-06-24 DIAGNOSIS — R319 Hematuria, unspecified: Secondary | ICD-10-CM | POA: Diagnosis not present

## 2016-06-24 DIAGNOSIS — N390 Urinary tract infection, site not specified: Secondary | ICD-10-CM

## 2016-06-24 LAB — POCT URINALYSIS DIPSTICK
BILIRUBIN UA: NEGATIVE
GLUCOSE UA: NEGATIVE
KETONES UA: NEGATIVE
LEUKOCYTES UA: NEGATIVE
Nitrite, UA: NEGATIVE
PH UA: 6
Protein, UA: 100
Spec Grav, UA: 1.01
Urobilinogen, UA: 0.2

## 2016-06-24 MED ORDER — DOXYCYCLINE MONOHYDRATE 100 MG PO CAPS: 100.0000 mg | ORAL_CAPSULE | Freq: Two times a day (BID) | ORAL | 0 refills | Status: DC

## 2016-06-24 NOTE — Patient Instructions (Signed)
We will call you with the culture result. Try Uristat or AZO for urinary discomfort.

## 2016-06-24 NOTE — Progress Notes (Signed)
Subjective:     Patient ID: Amy Meyers, female   DOB: 04-19-1961, 56 y.o.   MRN: [id number]  HPI  Chief Complaint  Patient presents with  . Urinary Tract Infection    x 1 week  Here in f/u of ER visit 06/17/16 for right flank pain. CT negative for stone and U/A with evidence of infection. Was treated for pyelonephritis. Patient reports mild improvement on keflex but continues to have urinary urgency and flank pain. Accompanied by her son today.   Review of Systems  Respiratory: Positive for cough.   Genitourinary: Negative for vaginal discharge.       Objective:   Physical Exam  Constitutional: She appears well-developed and well-nourished. No distress.  Pulmonary/Chest: Breath sounds normal.  Abdominal: Soft. There is no tenderness.  Genitourinary:  Genitourinary Comments: No cva tenderness       Assessment:    1. Urinary tract infection with hematuria, site unspecified - POCT urinalysis dipstick - Urine culture - doxycycline (MONODOX) 100 MG capsule; Take 1 capsule (100 mg total) by mouth 2 (two) times daily.  Dispense: 14 capsule; Refill: 0    Plan:    Further f/u pending urine culture. Discussed use of AZO or Uristat.

## 2016-06-26 ENCOUNTER — Telehealth: Payer: Self-pay

## 2016-06-26 LAB — URINE CULTURE

## 2016-06-26 NOTE — Telephone Encounter (Signed)
Would want to hear from the patient herself. Not sure I wish to rely on her family's perception.

## 2016-06-26 NOTE — Telephone Encounter (Signed)
-----   Message from Carmon Ginsberg, [place] sent at 06/26/2016  7:32 AM EST ----- No specific organism detected. Is she feeling better on the doxycycline?

## 2016-06-26 NOTE — Telephone Encounter (Signed)
Patient has been advised. KW 

## 2016-06-26 NOTE — Telephone Encounter (Signed)
Spoke with patient she states that she is feeling a little better, she complains of lower back pain on the right side, frequency of urination and odor. Patient states that she is still taking doxycycline. Please advise. KW

## 2016-06-26 NOTE — Telephone Encounter (Signed)
Spoke with patients daughter Latigra who patient lives with she states that patient has been very fatigued, complaining of back pain, dysuria and urinary odor.

## 2016-06-26 NOTE — Telephone Encounter (Signed)
Let's go a little longer on the doxycycline

## 2016-07-03 ENCOUNTER — Encounter: Payer: Self-pay | Admitting: Family Medicine

## 2016-07-03 ENCOUNTER — Ambulatory Visit (INDEPENDENT_AMBULATORY_CARE_PROVIDER_SITE_OTHER): Payer: Medicare Other | Admitting: Family Medicine

## 2016-07-03 VITALS — BP 120/68 | Temp 99.1°F | Resp 20 | Wt 235.0 lb

## 2016-07-03 DIAGNOSIS — K047 Periapical abscess without sinus: Secondary | ICD-10-CM

## 2016-07-03 MED ORDER — AMOXICILLIN-POT CLAVULANATE 875-125 MG PO TABS: 1.0000 | ORAL_TABLET | Freq: Two times a day (BID) | ORAL | 0 refills | Status: DC

## 2016-07-03 NOTE — Progress Notes (Signed)
[name] Female    DOB: 10/02/1960   56 y.o.   MRN: [id number] Visit Date: 07/03/2016  Today's Provider: Lelon Huh, MD   Chief Complaint  Patient presents with  . tooth infection   Subjective:    HPI Patient reports that she has an abscess tooth that has worsened over the weekend. She reports that she has pain radiating down her left jaw, and she has been taking Tylenol with no relief.     Allergies  Allergen Reactions  . Meloxicam Itching, Swelling and Nausea And Vomiting  . Sulfa Antibiotics Other (See Comments)     Current Outpatient Prescriptions:  .  aspirin 325 MG tablet, Take 325 mg by mouth daily. Reported on 07/30/2015, Disp: , Rfl:  .  doxycycline (MONODOX) 100 MG capsule, Take 1 capsule (100 mg total) by mouth 2 (two) times daily., Disp: 14 capsule, Rfl: 0 .  insulin aspart (NOVOLOG) 100 UNIT/ML injection, Inject 20 Units into the skin 3 (three) times daily with meals. Sliding scale. If blood sugar between 200-399 administer 20 units, 400 and over use 40 unitsInject into the skin. Sliding scale. If blood sugar between 200-399 administer 20 units, 400 and over use 40 units, Disp: 10 mL, Rfl: 3 .  insulin glargine (LANTUS) 100 UNIT/ML injection, Inject 1 mL (100 Units total) into the skin 2 (two) times daily. PATIENT NEEDS TO SCHEDULE OFFICE VISIT FOR FOLLOW UP, Disp: 30 mL, Rfl: 0 .  LORazepam (ATIVAN) 1 MG tablet, Take 1 tablet (1 mg total) by mouth 2 (two) times daily as needed for anxiety., Disp: 60 tablet, Rfl: 5 .  methadone (DOLOPHINE) 10 MG tablet, Limit 5-7 tabs by mouth per day if tolerated (Patient taking differently: Take 10 mg by mouth. Limit 5-7 tabs by mouth per day if tolerated), Disp: 210 tablet, Rfl: 0 .  Omega-3 Fatty Acids (FISH OIL) 1000 MG CAPS, Take by mouth 2 (two) times daily., Disp: , Rfl:  .  ondansetron (ZOFRAN ODT) 4 MG disintegrating tablet, Take 1 tablet (4 mg total) by mouth every 8 (eight) hours as needed for nausea or  vomiting., Disp: 20 tablet, Rfl: 0 .  oxyCODONE (OXY IR/ROXICODONE) 5 MG immediate release tablet, Limit 4 - 6 tabs po / day for breakthrough pain while taking methadone if tolerated, Disp: 180 tablet, Rfl: 0 .  promethazine (PHENERGAN) 25 MG tablet, Take 1 tablet (25 mg total) by mouth every 6 (six) hours as needed for nausea or vomiting., Disp: 20 tablet, Rfl: 0 .  cephALEXin (KEFLEX) 500 MG capsule, Take 1 capsule (500 mg total) by mouth 3 (three) times daily. (Patient not taking: Reported on 07/03/2016), Disp: 30 capsule, Rfl: 0  Review of Systems  Constitutional: Positive for appetite change, chills, fatigue and fever. Negative for activity change, diaphoresis and unexpected weight change.  Cardiovascular: Positive for leg swelling.  Musculoskeletal: Positive for arthralgias, myalgias and neck pain.  Neurological: Positive for headaches.    Social History  Substance Use Topics  . Smoking status: Current Every Day Smoker    Packs/day: 1.00    Years: 44.00    Types: Cigarettes  . Smokeless tobacco: Current User    Types: Chew  . Alcohol use No   Objective:   BP 120/68 (BP Location: Left Arm, Patient Position: Sitting, Cuff Size: Normal)   Temp 99.1 F (37.3 C)   Resp 20   Wt 235 lb (106.6 kg)   BMI 37.93 kg/m   Physical  Exam  General Appearance:    Alert, cooperative, no distress  HENT:   ENT exam normal, no neck nodes or sinus tenderness. Moderate swelling left upper and right lower gingiva. No discharge.   Eyes:    PERRL, conjunctiva/corneas clear, EOM's intact       Lungs:     Clear to auscultation bilaterally, respirations unlabored  Heart:    Regular rate and rhythm  Neurologic:   Awake, alert, oriented x 3. No apparent focal neurological           defect.           Assessment & Plan:     1. Dental infection  - amoxicillin-clavulanate (AUGMENTIN) 875-125 MG tablet; Take 1 tablet by mouth 2 (two) times daily.  Dispense: 20 tablet; Refill: 0 Advised to follow up  with dentist ASAP.       Lelon Huh, MD  [facility]

## 2016-07-10 DIAGNOSIS — M47817 Spondylosis without myelopathy or radiculopathy, lumbosacral region: Secondary | ICD-10-CM | POA: Diagnosis not present

## 2016-07-10 DIAGNOSIS — M5416 Radiculopathy, lumbar region: Secondary | ICD-10-CM | POA: Diagnosis not present

## 2016-07-10 DIAGNOSIS — M791 Myalgia: Secondary | ICD-10-CM | POA: Diagnosis not present

## 2016-07-10 DIAGNOSIS — M533 Sacrococcygeal disorders, not elsewhere classified: Secondary | ICD-10-CM | POA: Diagnosis not present

## 2016-07-12 ENCOUNTER — Encounter: Payer: Self-pay | Admitting: Physician Assistant

## 2016-07-25 ENCOUNTER — Telehealth: Payer: Self-pay | Admitting: Family Medicine

## 2016-07-25 NOTE — Telephone Encounter (Signed)
Please review. Thanks!  

## 2016-07-25 NOTE — Telephone Encounter (Signed)
Pt called saying her grandchildren have been diagnosed with scabies.  She wants to know if we can send her a rx to her pharmacy.  She uses [facility]

## 2016-07-26 MED ORDER — PERMETHRIN 5 % EX CREA: TOPICAL_CREAM | CUTANEOUS | 1 refills | Status: AC

## 2016-07-26 NOTE — Telephone Encounter (Signed)
Patient states she has some bites on her shoulder and itching there. NO other symptoms so far. Does she need to be seen?-aa

## 2016-07-26 NOTE — Telephone Encounter (Signed)
Does patient have any symptoms?

## 2016-07-26 NOTE — Telephone Encounter (Signed)
Called but no room on voicemail to leave a message-aa

## 2016-07-26 NOTE — Telephone Encounter (Signed)
Have sent rx permethrin to [facility].

## 2016-07-31 ENCOUNTER — Other Ambulatory Visit: Payer: Self-pay

## 2016-07-31 DIAGNOSIS — E0840 Diabetes mellitus due to underlying condition with diabetic neuropathy, unspecified: Secondary | ICD-10-CM

## 2016-07-31 DIAGNOSIS — Z794 Long term (current) use of insulin: Principal | ICD-10-CM

## 2016-07-31 MED ORDER — INSULIN GLARGINE 100 UNIT/ML ~~LOC~~ SOLN: 100.0000 [IU] | Freq: Two times a day (BID) | SUBCUTANEOUS | 0 refills | Status: DC

## 2016-07-31 NOTE — Telephone Encounter (Signed)
Pharmacy requesting refills. Thanks!  

## 2016-08-07 DIAGNOSIS — M5416 Radiculopathy, lumbar region: Secondary | ICD-10-CM | POA: Diagnosis not present

## 2016-08-07 DIAGNOSIS — M47817 Spondylosis without myelopathy or radiculopathy, lumbosacral region: Secondary | ICD-10-CM | POA: Diagnosis not present

## 2016-08-07 DIAGNOSIS — Z79891 Long term (current) use of opiate analgesic: Secondary | ICD-10-CM | POA: Diagnosis not present

## 2016-08-07 DIAGNOSIS — M533 Sacrococcygeal disorders, not elsewhere classified: Secondary | ICD-10-CM | POA: Diagnosis not present

## 2016-08-07 DIAGNOSIS — M791 Myalgia: Secondary | ICD-10-CM | POA: Diagnosis not present

## 2016-08-07 DIAGNOSIS — G894 Chronic pain syndrome: Secondary | ICD-10-CM | POA: Diagnosis not present

## 2016-08-30 ENCOUNTER — Other Ambulatory Visit: Payer: Self-pay | Admitting: Physician Assistant

## 2016-08-30 DIAGNOSIS — F419 Anxiety disorder, unspecified: Secondary | ICD-10-CM

## 2016-08-30 MED ORDER — LORAZEPAM 1 MG PO TABS: 1.0000 mg | ORAL_TABLET | Freq: Two times a day (BID) | ORAL | 5 refills | Status: DC | PRN

## 2016-08-30 NOTE — Telephone Encounter (Signed)
Last refill given 04/03/2016 qty 60 with 5 refills

## 2016-08-30 NOTE — Telephone Encounter (Signed)
RX called in at [facility].

## 2016-08-30 NOTE — Telephone Encounter (Signed)
[place] faxed a request for the following medication for pt Qty 60. Thanks CC  LORazepam (ATIVAN) 1 MG tablet Take one tablet by mouth twice (2) daily as needed for anxiety.

## 2016-09-05 DIAGNOSIS — M5418 Radiculopathy, sacral and sacrococcygeal region: Secondary | ICD-10-CM | POA: Diagnosis not present

## 2016-09-05 DIAGNOSIS — M791 Myalgia: Secondary | ICD-10-CM | POA: Diagnosis not present

## 2016-09-05 DIAGNOSIS — Z79891 Long term (current) use of opiate analgesic: Secondary | ICD-10-CM | POA: Diagnosis not present

## 2016-09-05 DIAGNOSIS — M5441 Lumbago with sciatica, right side: Secondary | ICD-10-CM | POA: Diagnosis not present

## 2016-09-05 DIAGNOSIS — M533 Sacrococcygeal disorders, not elsewhere classified: Secondary | ICD-10-CM | POA: Diagnosis not present

## 2016-09-05 DIAGNOSIS — M5416 Radiculopathy, lumbar region: Secondary | ICD-10-CM | POA: Diagnosis not present

## 2016-09-05 DIAGNOSIS — M47817 Spondylosis without myelopathy or radiculopathy, lumbosacral region: Secondary | ICD-10-CM | POA: Diagnosis not present

## 2016-09-05 DIAGNOSIS — M5442 Lumbago with sciatica, left side: Secondary | ICD-10-CM | POA: Diagnosis not present

## 2016-09-05 DIAGNOSIS — M25561 Pain in right knee: Secondary | ICD-10-CM | POA: Diagnosis not present

## 2016-09-05 DIAGNOSIS — Z96641 Presence of right artificial hip joint: Secondary | ICD-10-CM | POA: Diagnosis not present

## 2016-09-05 DIAGNOSIS — M47816 Spondylosis without myelopathy or radiculopathy, lumbar region: Secondary | ICD-10-CM | POA: Diagnosis not present

## 2016-09-12 ENCOUNTER — Other Ambulatory Visit: Payer: Self-pay | Admitting: *Deleted

## 2016-09-12 DIAGNOSIS — E0840 Diabetes mellitus due to underlying condition with diabetic neuropathy, unspecified: Secondary | ICD-10-CM

## 2016-09-12 DIAGNOSIS — Z794 Long term (current) use of insulin: Principal | ICD-10-CM

## 2016-09-12 MED ORDER — INSULIN GLARGINE 100 UNIT/ML ~~LOC~~ SOLN: 100.0000 [IU] | Freq: Two times a day (BID) | SUBCUTANEOUS | 0 refills | Status: DC

## 2016-09-12 NOTE — Telephone Encounter (Addendum)
No answer and unable to leave message. Will try again later.

## 2016-09-12 NOTE — Telephone Encounter (Signed)
Please advise patient she needs to schedule diabetes follow up, has not had follow up for diabetes since 2016. Have sent prescription for one weeks worth of lantus pen to get by until she can be seen.

## 2016-09-13 NOTE — Telephone Encounter (Signed)
Unable to reach pt. 3 of the contact numbers in her chart are invalid and 4th number has vm that is full. Did advise pharmacy that pt is to schedule appt before next refill.

## 2016-10-10 DIAGNOSIS — M5418 Radiculopathy, sacral and sacrococcygeal region: Secondary | ICD-10-CM | POA: Diagnosis not present

## 2016-10-10 DIAGNOSIS — M791 Myalgia: Secondary | ICD-10-CM | POA: Diagnosis not present

## 2016-10-10 DIAGNOSIS — M5416 Radiculopathy, lumbar region: Secondary | ICD-10-CM | POA: Diagnosis not present

## 2016-10-10 DIAGNOSIS — Z79891 Long term (current) use of opiate analgesic: Secondary | ICD-10-CM | POA: Diagnosis not present

## 2016-10-10 DIAGNOSIS — M5441 Lumbago with sciatica, right side: Secondary | ICD-10-CM | POA: Diagnosis not present

## 2016-10-10 DIAGNOSIS — M5442 Lumbago with sciatica, left side: Secondary | ICD-10-CM | POA: Diagnosis not present

## 2016-10-10 DIAGNOSIS — M47817 Spondylosis without myelopathy or radiculopathy, lumbosacral region: Secondary | ICD-10-CM | POA: Diagnosis not present

## 2016-10-10 DIAGNOSIS — M25561 Pain in right knee: Secondary | ICD-10-CM | POA: Diagnosis not present

## 2016-10-10 DIAGNOSIS — M533 Sacrococcygeal disorders, not elsewhere classified: Secondary | ICD-10-CM | POA: Diagnosis not present

## 2016-10-10 DIAGNOSIS — Z96641 Presence of right artificial hip joint: Secondary | ICD-10-CM | POA: Diagnosis not present

## 2016-10-10 DIAGNOSIS — M47816 Spondylosis without myelopathy or radiculopathy, lumbar region: Secondary | ICD-10-CM | POA: Diagnosis not present

## 2016-10-17 IMAGING — CR DG CHEST 2V
1 series · 2 of 2 positions shown · non-contrast
Comparison: August 01, 2013.

CLINICAL DATA: Hemoptysis.

EXAM:
CHEST  2 VIEW

[Series 1: w chest pa · 0.14mm/px · 2 of 2 slices shown]
[im 1/2]
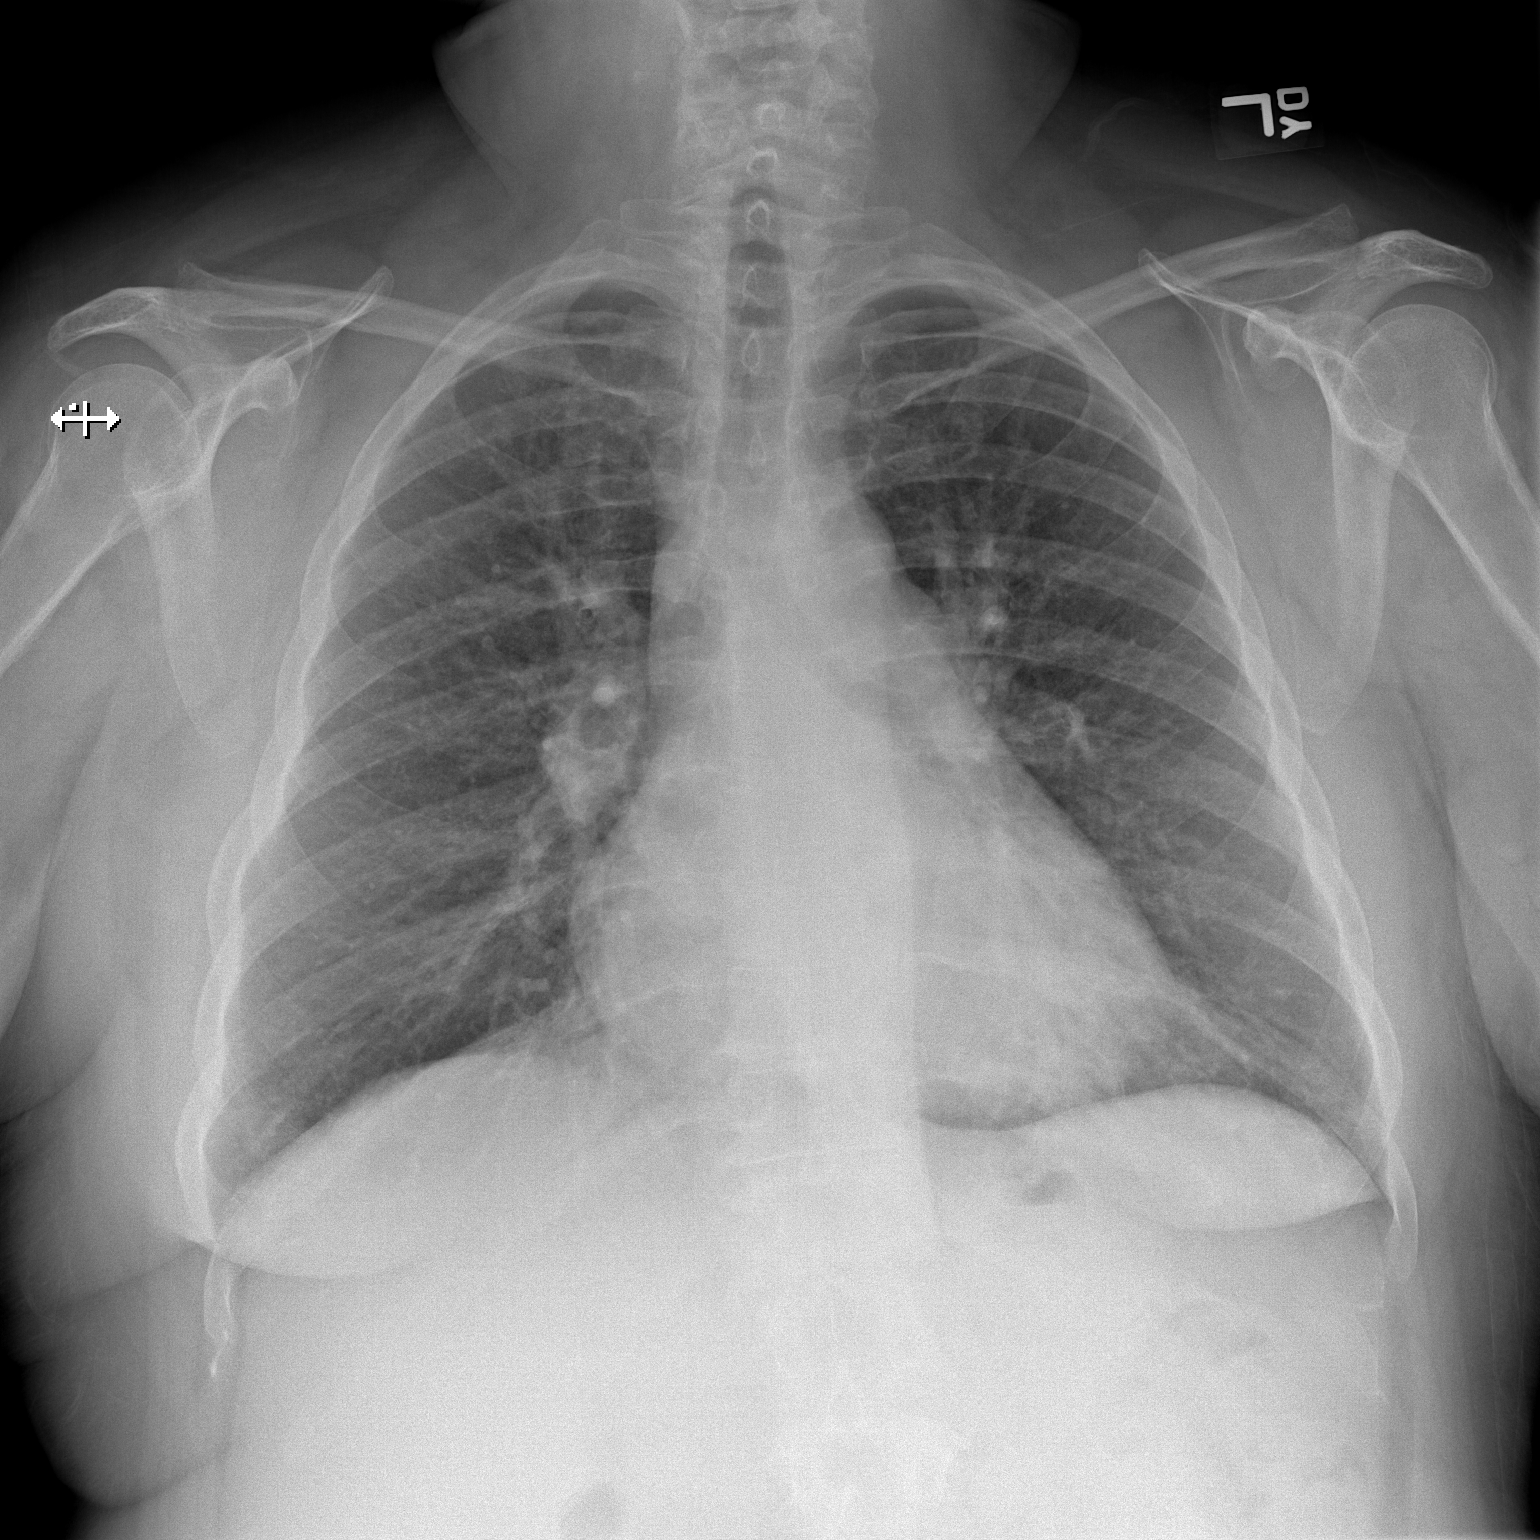
[im 2/2]
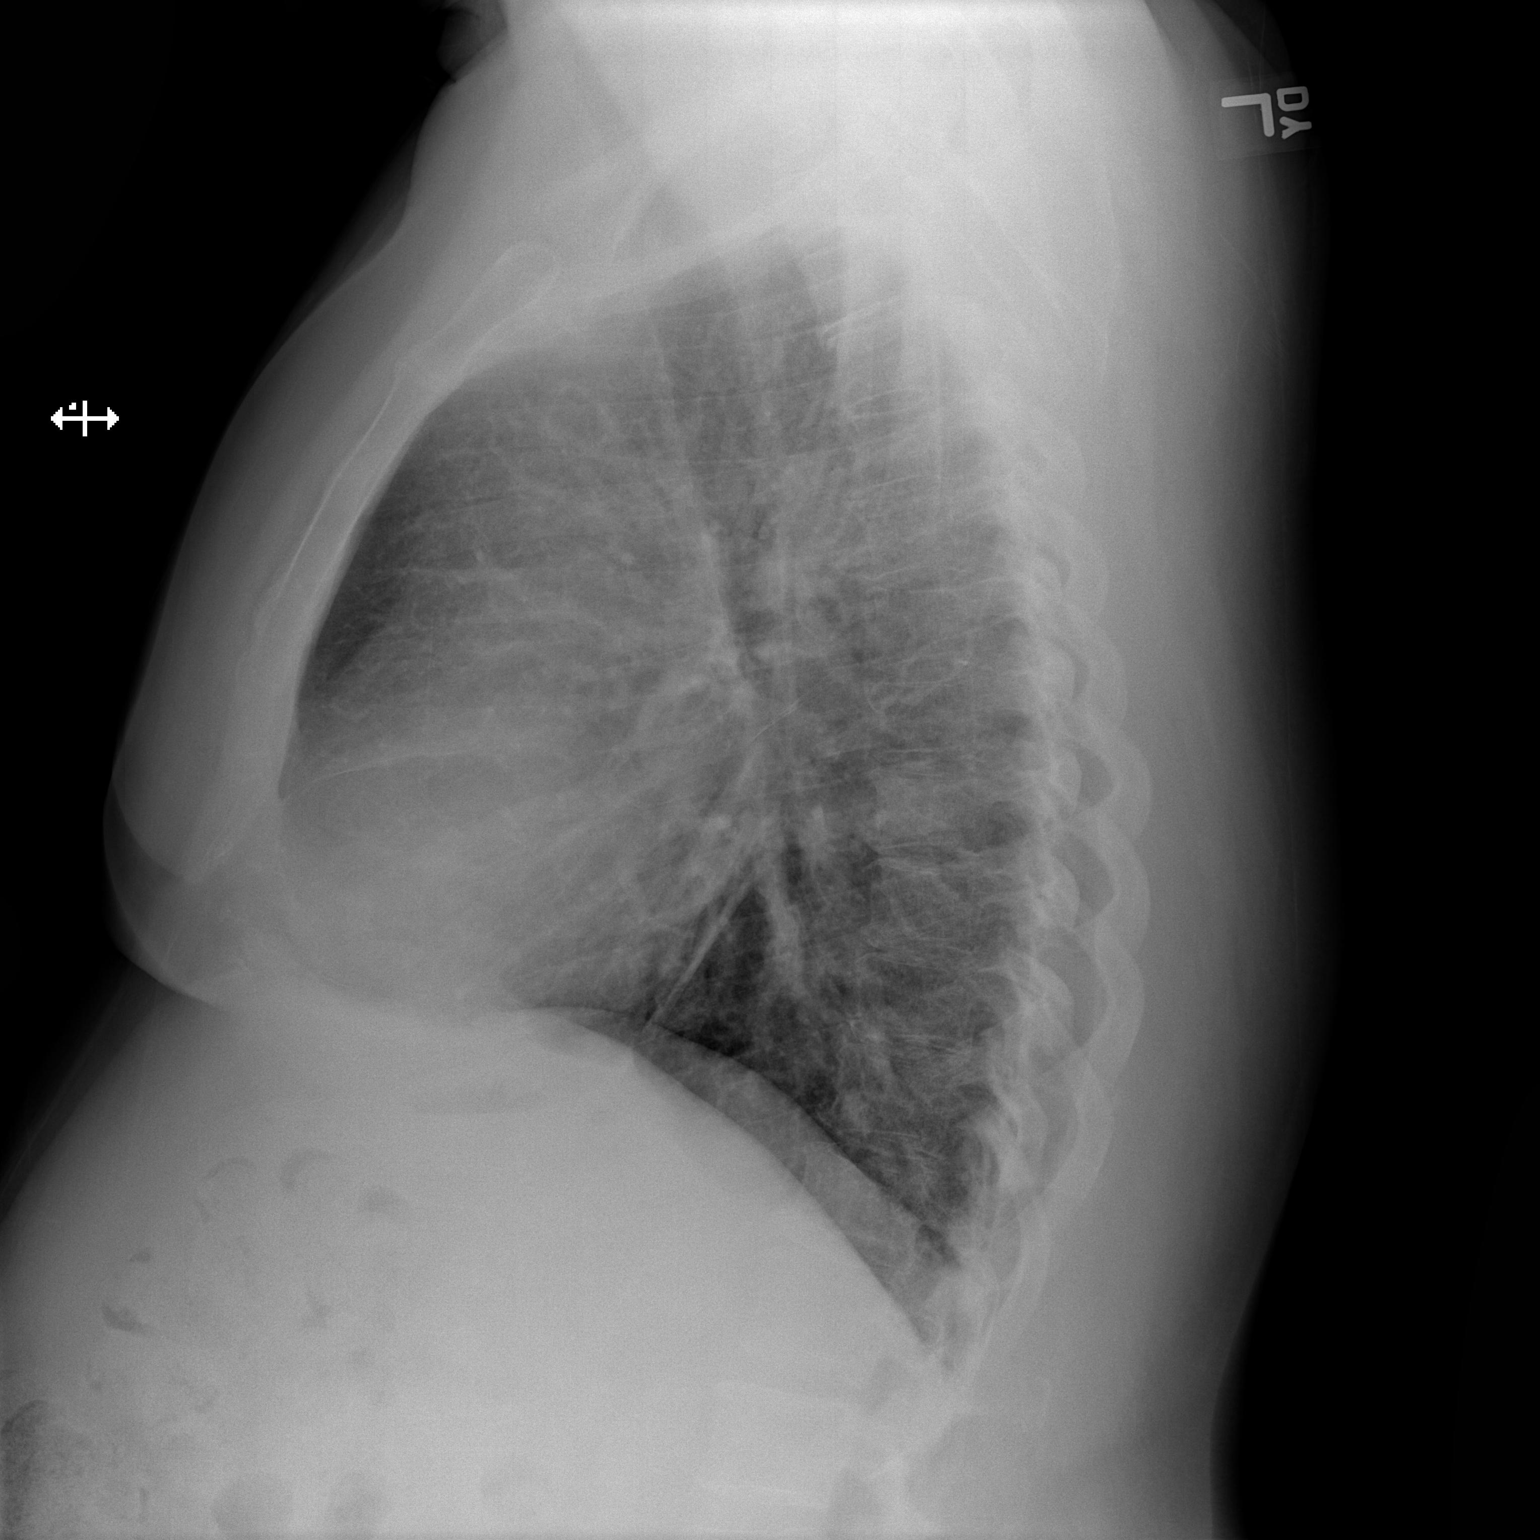

[2 of 2 positions shown; findings below may reference images not displayed]

FINDINGS: The heart size and mediastinal contours are within normal limits.
Both lungs are clear. No pneumothorax or pleural effusion is noted.
The visualized skeletal structures are unremarkable.
IMPRESSION: No acute cardiopulmonary abnormality seen.

## 2016-10-24 DIAGNOSIS — Z96641 Presence of right artificial hip joint: Secondary | ICD-10-CM | POA: Diagnosis not present

## 2016-10-24 DIAGNOSIS — M533 Sacrococcygeal disorders, not elsewhere classified: Secondary | ICD-10-CM | POA: Diagnosis not present

## 2016-10-24 DIAGNOSIS — M5416 Radiculopathy, lumbar region: Secondary | ICD-10-CM | POA: Diagnosis not present

## 2016-10-24 DIAGNOSIS — M5418 Radiculopathy, sacral and sacrococcygeal region: Secondary | ICD-10-CM | POA: Diagnosis not present

## 2016-10-24 DIAGNOSIS — M791 Myalgia: Secondary | ICD-10-CM | POA: Diagnosis not present

## 2016-10-24 DIAGNOSIS — M47817 Spondylosis without myelopathy or radiculopathy, lumbosacral region: Secondary | ICD-10-CM | POA: Diagnosis not present

## 2016-10-24 DIAGNOSIS — M25561 Pain in right knee: Secondary | ICD-10-CM | POA: Diagnosis not present

## 2016-10-24 DIAGNOSIS — M5442 Lumbago with sciatica, left side: Secondary | ICD-10-CM | POA: Diagnosis not present

## 2016-10-24 DIAGNOSIS — M47816 Spondylosis without myelopathy or radiculopathy, lumbar region: Secondary | ICD-10-CM | POA: Diagnosis not present

## 2016-10-24 DIAGNOSIS — Z79891 Long term (current) use of opiate analgesic: Secondary | ICD-10-CM | POA: Diagnosis not present

## 2016-10-24 DIAGNOSIS — M5441 Lumbago with sciatica, right side: Secondary | ICD-10-CM | POA: Diagnosis not present

## 2016-10-25 DIAGNOSIS — E119 Type 2 diabetes mellitus without complications: Secondary | ICD-10-CM | POA: Diagnosis not present

## 2016-10-25 DIAGNOSIS — G8929 Other chronic pain: Secondary | ICD-10-CM | POA: Diagnosis not present

## 2016-10-25 DIAGNOSIS — Z96641 Presence of right artificial hip joint: Secondary | ICD-10-CM | POA: Diagnosis not present

## 2016-10-25 DIAGNOSIS — M7989 Other specified soft tissue disorders: Secondary | ICD-10-CM | POA: Diagnosis not present

## 2016-10-25 DIAGNOSIS — M1612 Unilateral primary osteoarthritis, left hip: Secondary | ICD-10-CM | POA: Diagnosis not present

## 2016-10-25 DIAGNOSIS — M19071 Primary osteoarthritis, right ankle and foot: Secondary | ICD-10-CM | POA: Diagnosis not present

## 2016-10-25 DIAGNOSIS — M25551 Pain in right hip: Secondary | ICD-10-CM | POA: Diagnosis not present

## 2016-10-27 ENCOUNTER — Other Ambulatory Visit: Payer: Self-pay | Admitting: Family Medicine

## 2016-10-27 DIAGNOSIS — E0840 Diabetes mellitus due to underlying condition with diabetic neuropathy, unspecified: Secondary | ICD-10-CM

## 2016-10-27 DIAGNOSIS — Z794 Long term (current) use of insulin: Principal | ICD-10-CM

## 2016-10-27 NOTE — Telephone Encounter (Signed)
We don't have any samples of Lantus. Please verify what dose she is taking. We might have something to substitute for Lantus.

## 2016-10-27 NOTE — Telephone Encounter (Signed)
We don't have samples of anything that will last more than a day. Can send in prescription for 5 lantus pens. She should move her appointment up to next week.

## 2016-10-27 NOTE — Telephone Encounter (Signed)
Please review-aa 

## 2016-10-27 NOTE — Telephone Encounter (Signed)
Pt contacted office for refill request on the following medications:  insulin glargine (LANTUS) 100 UNIT/ML injection.  [facility].  UD#[phone]/[id number]  Pt took her last dosage today and is requesting a sample to be picked up today if possible.    Pt has an appointment for Monday 11/09/16/MW

## 2016-10-27 NOTE — Telephone Encounter (Signed)
Attempted to contact patient. No answer and voicemail has not been set up. 

## 2016-10-27 NOTE — Telephone Encounter (Signed)
Patient's daughter advised. Patient is taking Lantus 100 units BID.

## 2016-10-28 MED ORDER — INSULIN GLARGINE 100 UNIT/ML ~~LOC~~ SOLN: 100.0000 [IU] | Freq: Two times a day (BID) | SUBCUTANEOUS | 0 refills | Status: DC

## 2016-10-28 NOTE — Telephone Encounter (Signed)
Pens sent in and appointment made. Renaldo Fiddler, CMA

## 2016-10-30 ENCOUNTER — Ambulatory Visit: Payer: Medicare Other | Admitting: Family Medicine

## 2016-11-02 ENCOUNTER — Other Ambulatory Visit
Admission: RE | Admit: 2016-11-02 | Discharge: 2016-11-02 | Disposition: A | Discharge status: Home / Self Care | Payer: Medicare Other | Source: Ambulatory Visit | Attending: Family Medicine | Admitting: Family Medicine

## 2016-11-02 ENCOUNTER — Encounter: Payer: Self-pay | Admitting: Family Medicine

## 2016-11-02 ENCOUNTER — Ambulatory Visit (INDEPENDENT_AMBULATORY_CARE_PROVIDER_SITE_OTHER): Payer: Medicare Other | Admitting: Family Medicine

## 2016-11-02 ENCOUNTER — Telehealth: Payer: Self-pay | Admitting: Family Medicine

## 2016-11-02 VITALS — BP 130/70 | HR 85 | Temp 97.8°F | Resp 16 | Ht 66.0 in | Wt 224.0 lb

## 2016-11-02 DIAGNOSIS — I10 Essential (primary) hypertension: Secondary | ICD-10-CM

## 2016-11-02 DIAGNOSIS — Z794 Long term (current) use of insulin: Secondary | ICD-10-CM

## 2016-11-02 DIAGNOSIS — K7581 Nonalcoholic steatohepatitis (NASH): Secondary | ICD-10-CM

## 2016-11-02 DIAGNOSIS — E1140 Type 2 diabetes mellitus with diabetic neuropathy, unspecified: Secondary | ICD-10-CM

## 2016-11-02 DIAGNOSIS — Z1211 Encounter for screening for malignant neoplasm of colon: Secondary | ICD-10-CM

## 2016-11-02 DIAGNOSIS — Z720 Tobacco use: Secondary | ICD-10-CM | POA: Diagnosis not present

## 2016-11-02 DIAGNOSIS — E0840 Diabetes mellitus due to underlying condition with diabetic neuropathy, unspecified: Secondary | ICD-10-CM | POA: Diagnosis not present

## 2016-11-02 LAB — COMPREHENSIVE METABOLIC PANEL
ALBUMIN: 3.4 g/dL — AB (ref 3.5–5.0)
ALT: 18 U/L (ref 14–54)
ANION GAP: 7 (ref 5–15)
AST: 20 U/L (ref 15–41)
Alkaline Phosphatase: 70 U/L (ref 38–126)
BILIRUBIN TOTAL: 0.4 mg/dL (ref 0.3–1.2)
BUN: 9 mg/dL (ref 6–20)
CALCIUM: 8.5 mg/dL — AB (ref 8.9–10.3)
CO2: 31 mmol/L (ref 22–32)
Chloride: 98 mmol/L — ABNORMAL LOW (ref 101–111)
Creatinine, Ser: 0.69 mg/dL (ref 0.44–1.00)
Glucose, Bld: 168 mg/dL — ABNORMAL HIGH (ref 65–99)
POTASSIUM: 4.1 mmol/L (ref 3.5–5.1)
Sodium: 136 mmol/L (ref 135–145)
TOTAL PROTEIN: 7.2 g/dL (ref 6.5–8.1)

## 2016-11-02 LAB — LIPID PANEL
CHOL/HDL RATIO: 6.1 ratio
CHOLESTEROL: 158 mg/dL (ref 0–200)
HDL: 26 mg/dL — AB (ref >40)
LDL Cholesterol: 104 mg/dL — ABNORMAL HIGH (ref 0–99)
Triglycerides: 139 mg/dL (ref <150)
VLDL: 28 mg/dL (ref 0–40)

## 2016-11-02 LAB — POCT GLYCOSYLATED HEMOGLOBIN (HGB A1C)
Est. average glucose Bld gHb Est-mCnc: 174
Hemoglobin A1C: 7.7

## 2016-11-02 MED ORDER — INSULIN GLARGINE 100 UNIT/ML ~~LOC~~ SOLN: 100.0000 [IU] | Freq: Two times a day (BID) | SUBCUTANEOUS | 3 refills | Status: DC

## 2016-11-02 MED ORDER — INSULIN ASPART 100 UNIT/ML FLEXPEN: PEN_INJECTOR | SUBCUTANEOUS | 11 refills | Status: DC

## 2016-11-02 NOTE — Telephone Encounter (Signed)
She is on a sliding scale, so it ranges anywhere from 0 to 20 units three times a day.

## 2016-11-02 NOTE — Telephone Encounter (Signed)
[facility] called needing clarification on Mrs. Aristizabal insulin.    insulin aspart (NOVOLOG) 100 UNIT/ML FlexPen  Thanks teri

## 2016-11-02 NOTE — Telephone Encounter (Signed)
Medication updated in chart. Pharmacy notified.

## 2016-11-02 NOTE — Telephone Encounter (Signed)
Called the pharmacy and they wanted you to specify how many units of Novolog the patient is supposed to be taking. Thanks!

## 2016-11-02 NOTE — Patient Instructions (Addendum)
Check blood sugar before each meal      If blood sugar is >=150, but <200, take 8 units Novolog insulin before eating     If blood sugar is >=200, but <250, take 10 units Novolog insulin     If blood sugar is >=250, but <300, take 12 units Novolog insulin     If blood sugar is >=300, but <350, take 14 units Novolog insulin     If blood sugar is >=350, but <400  take 16 units Novolog insulin     If blood sugar is >=400  take 20 units Novolog insulin   Please record all blood sugars and bring record to your next appointment.   Please call the [facility] [phone]) to schedule a routine screening mammogram.   Please contact your eyecare professional to schedule a routine eye exam    .

## 2016-11-02 NOTE — Progress Notes (Signed)
[name] Female    DOB: 1961-05-12   56 y.o.   MRN: [id number] Visit Date: 11/02/2016  Today's Provider: Lelon Huh, MD   Chief Complaint  Patient presents with  . Follow-up  . Diabetes   Subjective:    HPI   Diabetes Mellitus Type II, Follow-up:   Lab Results  Component Value Date   HGBA1C 7.7 11/02/2016   HGBA1C 7.6 04/06/2015   HGBA1C 6.7 (A) 01/28/2014   Last seen for diabetes 04/06/2015. Management since then includes; no changes made. She reports good compliance with treatment. She is not having side effects. none Current symptoms include none and have been unchanged. Home blood sugar records: fasting range: 150  Episodes of hypoglycemia? no   Current Insulin Regimen: Lantus 100 units every morning and 50-100 at night. Taking Novolog before each meal, but has been out for the last few months. Was taking  Most Recent Eye Exam: none Weight trend: stable Prior visit with dietician: no Current diet: in general, an "unhealthy" diet Current exercise: none  ----------------------------------------------------------------     Allergies  Allergen Reactions  . Meloxicam Itching, Swelling and Nausea And Vomiting  . Sulfa Antibiotics Other (See Comments)     Current Outpatient Prescriptions:  .  aspirin 325 MG tablet, Take 325 mg by mouth daily. Reported on 07/30/2015, Disp: , Rfl:  .  insulin glargine (LANTUS) 100 UNIT/ML injection, Inject 1 mL (100 Units total) into the skin 2 (two) times daily., Disp: 15 mL, Rfl: 0 .  LORazepam (ATIVAN) 1 MG tablet, Take 1 tablet (1 mg total) by mouth 2 (two) times daily as needed for anxiety., Disp: 60 tablet, Rfl: 5 .  methadone (DOLOPHINE) 10 MG tablet, Limit 5-7 tabs by mouth per day if tolerated (Patient taking differently: Take 10 mg by mouth. Limit 5-7 tabs by mouth per day if tolerated), Disp: 210 tablet, Rfl: 0 .  ondansetron (ZOFRAN ODT) 4 MG disintegrating tablet, Take 1 tablet (4 mg total) by mouth  every 8 (eight) hours as needed for nausea or vomiting., Disp: 20 tablet, Rfl: 0 .  oxyCODONE (OXY IR/ROXICODONE) 5 MG immediate release tablet, Limit 4 - 6 tabs po / day for breakthrough pain while taking methadone if tolerated, Disp: 180 tablet, Rfl: 0 .  promethazine (PHENERGAN) 25 MG tablet, Take 1 tablet (25 mg total) by mouth every 6 (six) hours as needed for nausea or vomiting., Disp: 20 tablet, Rfl: 0 .  insulin aspart (NOVOLOG) 100 UNIT/ML injection, Inject 20 Units into the skin 3 (three) times daily with meals. Sliding scale. If blood sugar between 200-399 administer 20 units, 400 and over use 40 unitsInject into the skin. Sliding scale. If blood sugar between 200-399 administer 20 units, 400 and over use 40 units (Patient not taking: Reported on 11/02/2016), Disp: 10 mL, Rfl: 3  Review of Systems  Constitutional: Negative for appetite change, chills, fatigue and fever.  Respiratory: Negative for chest tightness and shortness of breath.   Cardiovascular: Negative for chest pain and palpitations.  Gastrointestinal: Negative for abdominal pain, nausea and vomiting.  Neurological: Negative for dizziness and weakness.    Social History  Substance Use Topics  . Smoking status: Current Every Day Smoker    Packs/day: 1.00    Years: 44.00    Types: Cigarettes  . Smokeless tobacco: Current User    Types: Chew  . Alcohol use No   Objective:   BP 130/70 (BP Location: Right Arm, Patient Position:  Sitting, Cuff Size: Large)   Pulse 85   Temp 97.8 F (36.6 C) (Oral)   Resp 16   Ht 5\' 6"  (1.676 m)   Wt 224 lb (101.6 kg)   SpO2 95%   BMI 36.15 kg/m  Vitals:   11/02/16 1134  BP: 130/70  Pulse: 85  Resp: 16  Temp: 97.8 F (36.6 C)  TempSrc: Oral  SpO2: 95%  Weight: 224 lb (101.6 kg)  Height: 5\' 6"  (1.676 m)     Physical Exam  General Appearance:    Alert, cooperative, no distress, obese  Eyes:    PERRL, conjunctiva/corneas clear, EOM's intact       Lungs:     Clear to  auscultation bilaterally, respirations unlabored  Heart:    Regular rate and rhythm  Neurologic:   Awake, alert, oriented x 3. No apparent focal neurological           defect.           Assessment & Plan:     1. Type 2 diabetes mellitus with diabetic neuropathy, with long-term current use of insulin ([place]) Fairly well controlled despite being out of mealtime Novolog for the past few months. Advised that she check blood sugar and inject Novolog per SS just before meals instead of after. Continue current basal insulin.   Counseled of cardiac benefits of statins in diabetics and we will likely start statin unless her cholesterol is very low.  - POCT glycosylated hemoglobin (Hb A1C) - Lipid panel - Comprehensive metabolic panel - insulin glargine (LANTUS) 100 UNIT/ML injection; Inject 1 mL (100 Units total) into the skin 2 (two) times daily.  Dispense: 4500 mL; Refill: 3 - insulin aspart (NOVOLOG) 100 UNIT/ML FlexPen; Inject before meals as directed by physican  Dispense: 15 mL; Refill: 11 2. Colon cancer screening  - Ambulatory referral to Gastroenterology  3. Essential hypertension Stable Continue current medications.    4. NASH (nonalcoholic steatohepatitis)   5. Current tobacco use Counseled health benefits of tobacco cessation.          Lelon Huh, MD  [facility]

## 2016-11-03 ENCOUNTER — Telehealth: Payer: Self-pay | Admitting: Family Medicine

## 2016-11-03 DIAGNOSIS — Z794 Long term (current) use of insulin: Principal | ICD-10-CM

## 2016-11-03 DIAGNOSIS — E1140 Type 2 diabetes mellitus with diabetic neuropathy, unspecified: Secondary | ICD-10-CM

## 2016-11-03 MED ORDER — INSULIN ASPART 100 UNIT/ML FLEXPEN: PEN_INJECTOR | SUBCUTANEOUS | 11 refills | Status: DC

## 2016-11-03 NOTE — Telephone Encounter (Signed)
[place] with [facility] called to get directions for the Rx insulin aspart (NOVOLOG) 100 UNIT/ML FlexPen.  CB#[id number]

## 2016-11-03 NOTE — Telephone Encounter (Signed)
Advised pharmacy about Novolog.

## 2016-11-07 DIAGNOSIS — M5418 Radiculopathy, sacral and sacrococcygeal region: Secondary | ICD-10-CM | POA: Diagnosis not present

## 2016-11-07 DIAGNOSIS — M47817 Spondylosis without myelopathy or radiculopathy, lumbosacral region: Secondary | ICD-10-CM | POA: Diagnosis not present

## 2016-11-07 DIAGNOSIS — M533 Sacrococcygeal disorders, not elsewhere classified: Secondary | ICD-10-CM | POA: Diagnosis not present

## 2016-11-07 DIAGNOSIS — M47816 Spondylosis without myelopathy or radiculopathy, lumbar region: Secondary | ICD-10-CM | POA: Diagnosis not present

## 2016-11-07 DIAGNOSIS — M5441 Lumbago with sciatica, right side: Secondary | ICD-10-CM | POA: Diagnosis not present

## 2016-11-07 DIAGNOSIS — M791 Myalgia: Secondary | ICD-10-CM | POA: Diagnosis not present

## 2016-11-07 DIAGNOSIS — M25561 Pain in right knee: Secondary | ICD-10-CM | POA: Diagnosis not present

## 2016-11-07 DIAGNOSIS — M5416 Radiculopathy, lumbar region: Secondary | ICD-10-CM | POA: Diagnosis not present

## 2016-11-07 DIAGNOSIS — M5442 Lumbago with sciatica, left side: Secondary | ICD-10-CM | POA: Diagnosis not present

## 2016-11-07 DIAGNOSIS — Z79891 Long term (current) use of opiate analgesic: Secondary | ICD-10-CM | POA: Diagnosis not present

## 2016-11-07 DIAGNOSIS — Z96641 Presence of right artificial hip joint: Secondary | ICD-10-CM | POA: Diagnosis not present

## 2016-11-20 ENCOUNTER — Other Ambulatory Visit: Payer: Self-pay

## 2016-11-20 ENCOUNTER — Telehealth: Payer: Self-pay

## 2016-11-20 DIAGNOSIS — Z1211 Encounter for screening for malignant neoplasm of colon: Secondary | ICD-10-CM

## 2016-11-20 NOTE — Telephone Encounter (Signed)
Gastroenterology Pre-Procedure Review  Request Date: 12/29/16 Requesting Physician: Dr. Vicente Males  PATIENT REVIEW QUESTIONS: The patient responded to the following health history questions as indicated:    1. Are you having any GI issues? yes (Hernia) 2. Do you have a personal history of Polyps? no 3. Do you have a family history of Colon Cancer or Polyps? yes (Mom Polyps) 4. Diabetes Mellitus? YES 5. Joint replacements in the past 12 months?no 6. Major health problems in the past 3 months?no 7. Any artificial heart valves, MVP, or defibrillator?no    MEDICATIONS & ALLERGIES:    Patient reports the following regarding taking any anticoagulation/antiplatelet therapy:   Plavix, Coumadin, Eliquis, Xarelto, Lovenox, Pradaxa, Brilinta, or Effient? no Aspirin? yes (325 MG DAILY)  Patient confirms/reports the following medications:  Current Outpatient Prescriptions  Medication Sig Dispense Refill  . aspirin 325 MG tablet Take 325 mg by mouth daily. Reported on 07/30/2015    . insulin aspart (NOVOLOG) 100 UNIT/ML FlexPen Inject before meals anywhere from 0 to 20 units, three times daily due to sliding scale. 15 mL 11  . insulin glargine (LANTUS) 100 UNIT/ML injection Inject 1 mL (100 Units total) into the skin 2 (two) times daily. 4500 mL 3  . LORazepam (ATIVAN) 1 MG tablet Take 1 tablet (1 mg total) by mouth 2 (two) times daily as needed for anxiety. 60 tablet 5  . methadone (DOLOPHINE) 10 MG tablet Limit 5-7 tabs by mouth per day if tolerated (Patient taking differently: Take 10 mg by mouth. Limit 5-7 tabs by mouth per day if tolerated) 210 tablet 0  . ondansetron (ZOFRAN ODT) 4 MG disintegrating tablet Take 1 tablet (4 mg total) by mouth every 8 (eight) hours as needed for nausea or vomiting. 20 tablet 0  . oxyCODONE (OXY IR/ROXICODONE) 5 MG immediate release tablet Limit 4 - 6 tabs po / day for breakthrough pain while taking methadone if tolerated 180 tablet 0  . promethazine (PHENERGAN) 25 MG  tablet Take 1 tablet (25 mg total) by mouth every 6 (six) hours as needed for nausea or vomiting. 20 tablet 0   No current facility-administered medications for this visit.     Patient confirms/reports the following allergies:  Allergies  Allergen Reactions  . Meloxicam Itching, Swelling and Nausea And Vomiting  . Sulfa Antibiotics Other (See Comments)    No orders of the defined types were placed in this encounter.   AUTHORIZATION INFORMATION Primary Insurance: 1D#: Group #:  Secondary Insurance: 1D#: Group #:  SCHEDULE INFORMATION: Date: 12/29/16 Time: Location:[facility]

## 2016-12-05 DIAGNOSIS — Z96641 Presence of right artificial hip joint: Secondary | ICD-10-CM | POA: Diagnosis not present

## 2016-12-05 DIAGNOSIS — M25561 Pain in right knee: Secondary | ICD-10-CM | POA: Diagnosis not present

## 2016-12-05 DIAGNOSIS — M791 Myalgia: Secondary | ICD-10-CM | POA: Diagnosis not present

## 2016-12-05 DIAGNOSIS — M5418 Radiculopathy, sacral and sacrococcygeal region: Secondary | ICD-10-CM | POA: Diagnosis not present

## 2016-12-05 DIAGNOSIS — M5442 Lumbago with sciatica, left side: Secondary | ICD-10-CM | POA: Diagnosis not present

## 2016-12-05 DIAGNOSIS — M5416 Radiculopathy, lumbar region: Secondary | ICD-10-CM | POA: Diagnosis not present

## 2016-12-05 DIAGNOSIS — Z79891 Long term (current) use of opiate analgesic: Secondary | ICD-10-CM | POA: Diagnosis not present

## 2016-12-05 DIAGNOSIS — M5441 Lumbago with sciatica, right side: Secondary | ICD-10-CM | POA: Diagnosis not present

## 2016-12-05 DIAGNOSIS — M533 Sacrococcygeal disorders, not elsewhere classified: Secondary | ICD-10-CM | POA: Diagnosis not present

## 2016-12-05 DIAGNOSIS — M47816 Spondylosis without myelopathy or radiculopathy, lumbar region: Secondary | ICD-10-CM | POA: Diagnosis not present

## 2016-12-05 DIAGNOSIS — M47817 Spondylosis without myelopathy or radiculopathy, lumbosacral region: Secondary | ICD-10-CM | POA: Diagnosis not present

## 2016-12-27 ENCOUNTER — Encounter: Payer: Self-pay | Admitting: Family Medicine

## 2016-12-27 DIAGNOSIS — R809 Proteinuria, unspecified: Secondary | ICD-10-CM | POA: Insufficient documentation

## 2016-12-29 ENCOUNTER — Ambulatory Visit: Admission: RE | Admit: 2016-12-29 | Payer: Medicare Other | Source: Ambulatory Visit | Admitting: Gastroenterology

## 2016-12-29 ENCOUNTER — Encounter: Admission: RE | Payer: Self-pay | Source: Ambulatory Visit

## 2016-12-29 SURGERY — COLONOSCOPY WITH PROPOFOL
Anesthesia: General

## 2017-01-02 DIAGNOSIS — M5418 Radiculopathy, sacral and sacrococcygeal region: Secondary | ICD-10-CM | POA: Diagnosis not present

## 2017-01-02 DIAGNOSIS — M5441 Lumbago with sciatica, right side: Secondary | ICD-10-CM | POA: Diagnosis not present

## 2017-01-02 DIAGNOSIS — M5442 Lumbago with sciatica, left side: Secondary | ICD-10-CM | POA: Diagnosis not present

## 2017-01-02 DIAGNOSIS — M47816 Spondylosis without myelopathy or radiculopathy, lumbar region: Secondary | ICD-10-CM | POA: Diagnosis not present

## 2017-01-02 DIAGNOSIS — M47817 Spondylosis without myelopathy or radiculopathy, lumbosacral region: Secondary | ICD-10-CM | POA: Diagnosis not present

## 2017-01-02 DIAGNOSIS — M25561 Pain in right knee: Secondary | ICD-10-CM | POA: Diagnosis not present

## 2017-01-02 DIAGNOSIS — M791 Myalgia: Secondary | ICD-10-CM | POA: Diagnosis not present

## 2017-01-02 DIAGNOSIS — M5416 Radiculopathy, lumbar region: Secondary | ICD-10-CM | POA: Diagnosis not present

## 2017-01-02 DIAGNOSIS — M533 Sacrococcygeal disorders, not elsewhere classified: Secondary | ICD-10-CM | POA: Diagnosis not present

## 2017-01-02 DIAGNOSIS — Z79891 Long term (current) use of opiate analgesic: Secondary | ICD-10-CM | POA: Diagnosis not present

## 2017-01-02 DIAGNOSIS — Z96641 Presence of right artificial hip joint: Secondary | ICD-10-CM | POA: Diagnosis not present

## 2017-01-30 DIAGNOSIS — M47817 Spondylosis without myelopathy or radiculopathy, lumbosacral region: Secondary | ICD-10-CM | POA: Diagnosis not present

## 2017-01-30 DIAGNOSIS — Z79891 Long term (current) use of opiate analgesic: Secondary | ICD-10-CM | POA: Diagnosis not present

## 2017-01-30 DIAGNOSIS — G894 Chronic pain syndrome: Secondary | ICD-10-CM | POA: Diagnosis not present

## 2017-01-30 DIAGNOSIS — M5416 Radiculopathy, lumbar region: Secondary | ICD-10-CM | POA: Diagnosis not present

## 2017-02-26 DIAGNOSIS — Z5181 Encounter for therapeutic drug level monitoring: Secondary | ICD-10-CM | POA: Diagnosis not present

## 2017-02-26 DIAGNOSIS — M533 Sacrococcygeal disorders, not elsewhere classified: Secondary | ICD-10-CM | POA: Diagnosis not present

## 2017-02-26 DIAGNOSIS — Z96641 Presence of right artificial hip joint: Secondary | ICD-10-CM | POA: Diagnosis not present

## 2017-02-26 DIAGNOSIS — G894 Chronic pain syndrome: Secondary | ICD-10-CM | POA: Diagnosis not present

## 2017-02-26 DIAGNOSIS — M47817 Spondylosis without myelopathy or radiculopathy, lumbosacral region: Secondary | ICD-10-CM | POA: Diagnosis not present

## 2017-02-26 DIAGNOSIS — M5416 Radiculopathy, lumbar region: Secondary | ICD-10-CM | POA: Diagnosis not present

## 2017-02-26 DIAGNOSIS — M5136 Other intervertebral disc degeneration, lumbar region: Secondary | ICD-10-CM | POA: Diagnosis not present

## 2017-02-26 DIAGNOSIS — M545 Low back pain: Secondary | ICD-10-CM | POA: Diagnosis not present

## 2017-03-01 ENCOUNTER — Other Ambulatory Visit: Payer: Self-pay | Admitting: Physician Assistant

## 2017-03-01 DIAGNOSIS — F419 Anxiety disorder, unspecified: Secondary | ICD-10-CM

## 2017-03-01 NOTE — Telephone Encounter (Signed)
Please call in lorazepam.  

## 2017-03-02 NOTE — Telephone Encounter (Signed)
rx called in-Kaavya Puskarich V Eugene Isadore, RMA  

## 2017-03-07 ENCOUNTER — Ambulatory Visit: Payer: Medicare Other | Admitting: Family Medicine

## 2017-04-02 DIAGNOSIS — M545 Low back pain: Secondary | ICD-10-CM | POA: Diagnosis not present

## 2017-04-02 DIAGNOSIS — Z96641 Presence of right artificial hip joint: Secondary | ICD-10-CM | POA: Diagnosis not present

## 2017-04-02 DIAGNOSIS — M5136 Other intervertebral disc degeneration, lumbar region: Secondary | ICD-10-CM | POA: Diagnosis not present

## 2017-04-02 DIAGNOSIS — Z5181 Encounter for therapeutic drug level monitoring: Secondary | ICD-10-CM | POA: Diagnosis not present

## 2017-04-11 ENCOUNTER — Ambulatory Visit: Payer: Medicare Other | Admitting: Family Medicine

## 2017-04-11 NOTE — Progress Notes (Deleted)
       [name] Female    DOB: 04-12-1961   56 y.o.   MRN: [id number] Visit Date: 04/11/2017  Today's Provider: Lelon Huh, MD   No chief complaint on file.  Subjective:    HPI     Allergies  Allergen Reactions  . Meloxicam Itching, Swelling and Nausea And Vomiting  . Sulfa Antibiotics Other (See Comments)     Current Outpatient Medications:  .  aspirin 325 MG tablet, Take 325 mg by mouth daily. Reported on 07/30/2015, Disp: , Rfl:  .  insulin aspart (NOVOLOG) 100 UNIT/ML FlexPen, Inject before meals anywhere from 0 to 20 units, three times daily due to sliding scale., Disp: 15 mL, Rfl: 11 .  insulin glargine (LANTUS) 100 UNIT/ML injection, Inject 1 mL (100 Units total) into the skin 2 (two) times daily., Disp: 4500 mL, Rfl: 3 .  LORazepam (ATIVAN) 1 MG tablet, TAKE ONE TABLET BY MOUTH TWICE DAILY AS NEEDED FOR ANXIETY, Disp: 60 tablet, Rfl: 3 .  methadone (DOLOPHINE) 10 MG tablet, Limit 5-7 tabs by mouth per day if tolerated (Patient taking differently: Take 10 mg by mouth. Limit 5-7 tabs by mouth per day if tolerated), Disp: 210 tablet, Rfl: 0 .  ondansetron (ZOFRAN ODT) 4 MG disintegrating tablet, Take 1 tablet (4 mg total) by mouth every 8 (eight) hours as needed for nausea or vomiting., Disp: 20 tablet, Rfl: 0 .  oxyCODONE (OXY IR/ROXICODONE) 5 MG immediate release tablet, Limit 4 - 6 tabs po / day for breakthrough pain while taking methadone if tolerated, Disp: 180 tablet, Rfl: 0 .  promethazine (PHENERGAN) 25 MG tablet, Take 1 tablet (25 mg total) by mouth every 6 (six) hours as needed for nausea or vomiting., Disp: 20 tablet, Rfl: 0  Review of Systems  Constitutional: Negative for appetite change, chills, fatigue and fever.  Respiratory: Negative for chest tightness and shortness of breath.   Cardiovascular: Negative for chest pain and palpitations.  Gastrointestinal: Negative for abdominal pain, nausea and vomiting.  Neurological: Negative for dizziness and  weakness.    Social History   Tobacco Use  . Smoking status: Current Every Day Smoker    Packs/day: 1.00    Years: 44.00    Pack years: 44.00    Types: Cigarettes  . Smokeless tobacco: Current User    Types: Chew  Substance Use Topics  . Alcohol use: No    Alcohol/week: 0.0 oz   Objective:   There were no vitals taken for this visit. There were no vitals filed for this visit.   Physical Exam      Assessment & Plan:           Lelon Huh, MD  [facility]

## 2017-04-13 ENCOUNTER — Ambulatory Visit: Payer: Medicare Other | Admitting: Family Medicine

## 2017-04-21 ENCOUNTER — Emergency Department: Payer: Medicare Other

## 2017-04-21 ENCOUNTER — Other Ambulatory Visit: Payer: Self-pay

## 2017-04-21 ENCOUNTER — Encounter: Payer: Self-pay | Admitting: Emergency Medicine

## 2017-04-21 ENCOUNTER — Emergency Department
Admission: EM | Admit: 2017-04-21 | Discharge: 2017-04-21 | Disposition: A | Discharge status: Home / Self Care | Payer: Medicare Other | Attending: Emergency Medicine | Admitting: Emergency Medicine

## 2017-04-21 DIAGNOSIS — E11621 Type 2 diabetes mellitus with foot ulcer: Secondary | ICD-10-CM | POA: Insufficient documentation

## 2017-04-21 DIAGNOSIS — Z96641 Presence of right artificial hip joint: Secondary | ICD-10-CM | POA: Insufficient documentation

## 2017-04-21 DIAGNOSIS — L97509 Non-pressure chronic ulcer of other part of unspecified foot with unspecified severity: Secondary | ICD-10-CM | POA: Diagnosis not present

## 2017-04-21 DIAGNOSIS — R350 Frequency of micturition: Secondary | ICD-10-CM | POA: Diagnosis present

## 2017-04-21 DIAGNOSIS — R1012 Left upper quadrant pain: Secondary | ICD-10-CM | POA: Diagnosis not present

## 2017-04-21 DIAGNOSIS — Z794 Long term (current) use of insulin: Secondary | ICD-10-CM | POA: Insufficient documentation

## 2017-04-21 DIAGNOSIS — F17210 Nicotine dependence, cigarettes, uncomplicated: Secondary | ICD-10-CM | POA: Insufficient documentation

## 2017-04-21 DIAGNOSIS — N12 Tubulo-interstitial nephritis, not specified as acute or chronic: Secondary | ICD-10-CM | POA: Insufficient documentation

## 2017-04-21 DIAGNOSIS — Z7982 Long term (current) use of aspirin: Secondary | ICD-10-CM | POA: Insufficient documentation

## 2017-04-21 DIAGNOSIS — K439 Ventral hernia without obstruction or gangrene: Secondary | ICD-10-CM | POA: Diagnosis not present

## 2017-04-21 LAB — URINALYSIS, COMPLETE (UACMP) WITH MICROSCOPIC
BILIRUBIN URINE: NEGATIVE
Glucose, UA: NEGATIVE mg/dL
KETONES UR: NEGATIVE mg/dL
NITRITE: NEGATIVE
SPECIFIC GRAVITY, URINE: 1.013 (ref 1.005–1.030)
pH: 6 (ref 5.0–8.0)

## 2017-04-21 LAB — CBC WITH DIFFERENTIAL/PLATELET
Basophils Absolute: 0.1 10*3/uL (ref 0–0.1)
Basophils Relative: 1 %
Eosinophils Absolute: 0.2 10*3/uL (ref 0–0.7)
Eosinophils Relative: 2 %
HCT: 44.8 % (ref 35.0–47.0)
Hemoglobin: 15.2 g/dL (ref 12.0–16.0)
Lymphocytes Relative: 22 %
Lymphs Abs: 2.2 10*3/uL (ref 1.0–3.6)
MCH: 28.1 pg (ref 26.0–34.0)
MCHC: 33.9 g/dL (ref 32.0–36.0)
MCV: 82.9 fL (ref 80.0–100.0)
Monocytes Absolute: 0.6 10*3/uL (ref 0.2–0.9)
Monocytes Relative: 6 %
Neutro Abs: 6.9 10*3/uL — ABNORMAL HIGH (ref 1.4–6.5)
Neutrophils Relative %: 69 %
Platelets: 124 10*3/uL — ABNORMAL LOW (ref 150–440)
RBC: 5.4 MIL/uL — ABNORMAL HIGH (ref 3.80–5.20)
RDW: 15.5 % — ABNORMAL HIGH (ref 11.5–14.5)
WBC: 9.9 10*3/uL (ref 3.6–11.0)

## 2017-04-21 LAB — COMPREHENSIVE METABOLIC PANEL
ALT: 15 U/L (ref 14–54)
AST: 19 U/L (ref 15–41)
Albumin: 3.2 g/dL — ABNORMAL LOW (ref 3.5–5.0)
Alkaline Phosphatase: 68 U/L (ref 38–126)
Anion gap: 7 (ref 5–15)
BUN: 13 mg/dL (ref 6–20)
CO2: 30 mmol/L (ref 22–32)
Calcium: 8.3 mg/dL — ABNORMAL LOW (ref 8.9–10.3)
Chloride: 98 mmol/L — ABNORMAL LOW (ref 101–111)
Creatinine, Ser: 0.83 mg/dL (ref 0.44–1.00)
GFR calc Af Amer: >60 mL/min (ref >60)
GFR calc non Af Amer: >60 mL/min (ref >60)
Glucose, Bld: 216 mg/dL — ABNORMAL HIGH (ref 65–99)
Potassium: 4.2 mmol/L (ref 3.5–5.1)
Sodium: 135 mmol/L (ref 135–145)
Total Bilirubin: 0.5 mg/dL (ref 0.3–1.2)
Total Protein: 7.2 g/dL (ref 6.5–8.1)

## 2017-04-21 MED ORDER — CEPHALEXIN 500 MG PO CAPS: 500.0000 mg | ORAL_CAPSULE | Freq: Three times a day (TID) | ORAL | 0 refills | Status: DC

## 2017-04-21 MED ORDER — ONDANSETRON HCL 4 MG PO TABS: 4.0000 mg | ORAL_TABLET | Freq: Three times a day (TID) | ORAL | 1 refills | Status: AC | PRN

## 2017-04-21 MED ORDER — ONDANSETRON 4 MG PO TBDP
4.0000 mg | ORAL_TABLET | Freq: Once | ORAL | Status: AC
  Administered 2017-04-21: 4 mg via ORAL
  Filled 2017-04-21: qty 1

## 2017-04-21 MED ORDER — LEVOFLOXACIN 750 MG PO TABS: 750.0000 mg | ORAL_TABLET | Freq: Every day | ORAL | 0 refills | Status: AC

## 2017-04-21 NOTE — ED Provider Notes (Signed)
[facility] Emergency Department Provider Note  ____________________________________________  Time seen: Approximately 4:50 PM  I have reviewed the triage vital signs and the nursing notes.   HISTORY  Chief Complaint Dysuria    HPI Amy Meyers is a 56 y.o. female presenting to the emergency department with increased urinary frequency, dysuria, left-sided flank pain and nausea for approximately 1 week.  Patient reports a history of nephrolithiasis "several years ago".  She denies changes in vaginal discharge or dyspareunia.  She has not evaluated her temperature at home.  Patient reports that she feels "so bad and weak".  She denies chest pain, chest tightness and hematuria.  She complains of suprapubic discomfort.   Past Medical History:  Diagnosis Date  . Anxiety   . Arthritis   . Diabetes mellitus without complication ([place])   . Diabetic foot ulcer ([place]) 04/06/2015  . Hypertension   . Kidney infection   . Open wound of right foot   . Staph infection    right foot    Patient Active Problem List   Diagnosis Date Noted  . Proteinuria 12/27/2016  . Callous ulcer ([place]) 05/11/2015  . Anxiety 04/13/2015  . Arthritis, degenerative 04/06/2015  . Neuropathy 04/06/2015  . Back pain, chronic 04/06/2015  . Abnormal ECG 03/17/2015  . Hypertension 03/17/2015  . NASH (nonalcoholic steatohepatitis) 03/17/2015  . Obesity 03/17/2015  . Palpitations 03/17/2015  . Pulmonary edema 03/17/2015  . PVC's (premature ventricular contractions) 03/17/2015  . Breath shortness 03/17/2015  . Headache, tension-type 03/17/2015  . Type 2 diabetes mellitus with right diabetic foot ulcer ([place]) 01/06/2015  . Facet syndrome, lumbar 11/26/2014  . Sacroiliac joint dysfunction 11/26/2014  . DDD (degenerative disc disease), thoracolumbar 09/29/2014  . Diabetes mellitus due to underlying condition with diabetic neuropathy ([place]) 09/29/2014  . 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 Osteomyelitis of lower leg ([place]) 03/11/2014  . Current tobacco use 12/19/2013  . Systemic inflammatory response syndrome (SIRS) ([place]) 12/16/2013  . Arthritis 12/16/2013    Past Surgical History:  Procedure Laterality Date  . ABDOMINAL ADHESION SURGERY    . ABDOMINAL HYSTERECTOMY  1998   with oophorectomy due to adhesions  . CARPAL TUNNEL RELEASE Bilateral   . [place]   x 2   . CHOLECYSTECTOMY  1981  . FOOT SURGERY Left   . JOINT REPLACEMENT     right hip replacement  . TONSILLECTOMY  1980  . TONSILLECTOMY    . upper GI X ray series  06/04/2001   Hiatal Hernia    Prior to Admission medications   Medication Sig Start Date End Date Taking? Authorizing Provider  aspirin 325 MG tablet Take 325 mg by mouth daily. Reported on 07/30/2015    [provider]  insulin aspart (NOVOLOG) 100 UNIT/ML FlexPen Inject before meals anywhere from 0 to 20 units, three times daily due to sliding scale. 11/03/16   Birdie Sons, MD  insulin glargine (LANTUS) 100 UNIT/ML injection Inject 1 mL (100 Units total) into the skin 2 (two) times daily. 11/02/16   Birdie Sons, MD  levofloxacin (LEVAQUIN) 750 MG tablet Take 1 tablet (750 mg total) by mouth daily for 5 days. 04/21/17 04/26/17  Vallarie Mare M, [name]  LORazepam (ATIVAN) 1 MG tablet TAKE ONE TABLET BY MOUTH TWICE DAILY AS NEEDED FOR ANXIETY 03/01/17   Birdie Sons, MD  methadone (DOLOPHINE) 10 MG tablet Limit 5-7 tabs by mouth per  day if tolerated Patient taking differently: Take 10 mg by mouth. Limit 5-7 tabs by mouth per day if tolerated 01/13/16   Mohammed Kindle, MD  ondansetron (ZOFRAN ODT) 4 MG disintegrating tablet Take 1 tablet (4 mg total) by mouth every 8 (eight) hours as needed for nausea or vomiting. 11/13/15   Harvest Dark, MD  ondansetron (ZOFRAN) 4 MG tablet Take 1 tablet (4 mg total) by mouth every 8 (eight) hours as needed for up to 5 days for nausea or  vomiting. 04/21/17 04/26/17  Lannie Fields, [name]  oxyCODONE (OXY IR/ROXICODONE) 5 MG immediate release tablet Limit 4 - 6 tabs po / day for breakthrough pain while taking methadone if tolerated 01/13/16   Mohammed Kindle, MD  promethazine (PHENERGAN) 25 MG tablet Take 1 tablet (25 mg total) by mouth every 6 (six) hours as needed for nausea or vomiting. 11/13/15   Harvest Dark, MD    Allergies Meloxicam and Sulfa antibiotics  Family History  Problem Relation Age of Onset  . Alcohol abuse Mother   . Arthritis Mother   . COPD Mother   . Depression Mother   . Diabetes Mother   . Alcohol abuse Father   . Depression Father   . Early death Father   . Learning disabilities Father   . Alcohol abuse Brother   . Arthritis Brother   . Diabetes Maternal Grandmother   . Varicose Veins Paternal Grandmother   . Diabetes Maternal Grandfather   . Alcohol abuse Paternal Grandfather     Social History Social History   Tobacco Use  . Smoking status: Current Every Day Smoker    Packs/day: 1.00    Years: 44.00    Pack years: 44.00    Types: Cigarettes  . Smokeless tobacco: Current User    Types: Chew  Substance Use Topics  . Alcohol use: No    Alcohol/week: 0.0 oz  . Drug use: No     Review of Systems  Constitutional: No fever/chills Eyes: No visual changes. No discharge ENT: No upper respiratory complaints. Cardiovascular: no chest pain. Respiratory: no cough. No SOB. Gastrointestinal: No abdominal pain.  No nausea, no vomiting.  No diarrhea.  No constipation. Genitourinary: Patient has dysuria and increased urinary frequency along with left-sided flank pain. Musculoskeletal: Negative for musculoskeletal pain. Skin: Negative for rash, abrasions, lacerations, ecchymosis. Neurological: Negative for headaches, focal weakness or numbness.   ____________________________________________   PHYSICAL EXAM:  VITAL SIGNS: ED Triage Vitals  Enc Vitals Group     BP 04/21/17 1548  (!) 166/73     Pulse Rate 04/21/17 1548 96     Resp 04/21/17 1548 20     Temp 04/21/17 1548 98.9 F (37.2 C)     Temp Source 04/21/17 1548 Oral     SpO2 04/21/17 1548 94 %     Weight 04/21/17 1549 220 lb (99.8 kg)     Height 04/21/17 1549 5\' 6"  (1.676 m)     Head Circumference --      Peak Flow --      Pain Score 04/21/17 1548 7     Pain Loc --      Pain Edu? --      Excl. in [place]? --      Constitutional: Alert and oriented. Well appearing and in no acute distress. Eyes: Conjunctivae are normal. PERRL. EOMI. Cardiovascular: Normal rate, regular rhythm. Normal S1 and S2.  Good peripheral circulation. Respiratory: Normal respiratory effort without tachypnea or retractions. Lungs CTAB. Good air  entry to the bases with no decreased or absent breath sounds. Gastrointestinal: Bowel sounds 4 quadrants.  Patient has suprapubic tenderness to palpation.  No guarding or rigidity. No palpable masses. No distention.  Patient has left CVA tenderness. Musculoskeletal: Full range of motion to all extremities. No gross deformities appreciated. Neurologic:  Normal speech and language. No gross focal neurologic deficits are appreciated.  Skin:  Skin is warm, dry and intact. No rash noted. Psychiatric: Mood and affect are normal. Speech and behavior are normal. Patient exhibits appropriate insight and judgement.   ____________________________________________   LABS (all labs ordered are listed, but only abnormal results are displayed)  Labs Reviewed  URINALYSIS, COMPLETE (UACMP) WITH MICROSCOPIC - Abnormal; Notable for the following components:      Result Value   Color, Urine YELLOW (*)    APPearance HAZY (*)    Hgb urine dipstick SMALL (*)    Protein, ur >=300 (*)    Leukocytes, UA SMALL (*)    Bacteria, UA RARE (*)    Squamous Epithelial / LPF 0-5 (*)    All other components within normal limits  CBC WITH DIFFERENTIAL/PLATELET - Abnormal; Notable for the following components:   RBC 5.40  (*)    RDW 15.5 (*)    Platelets 124 (*)    Neutro Abs 6.9 (*)    All other components within normal limits  COMPREHENSIVE METABOLIC PANEL - Abnormal; Notable for the following components:   Chloride 98 (*)    Glucose, Bld 216 (*)    Calcium 8.3 (*)    Albumin 3.2 (*)    All other components within normal limits   ____________________________________________  EKG   ____________________________________________  RADIOLOGY Unk Pinto, personally viewed and evaluated these images as part of my medical decision making, as well as reviewing the written report by the radiologist.  Ct Renal Stone Study  Result Date: 04/21/2017 CLINICAL DATA:  Left-sided flank pain for 1 week. Urinary frequency. EXAM: CT ABDOMEN AND PELVIS WITHOUT CONTRAST TECHNIQUE: Multidetector CT imaging of the abdomen and pelvis was performed following the standard protocol without IV contrast. COMPARISON:  06/17/2016 FINDINGS: Lower chest: No acute findings. Hepatobiliary: No mass visualized on this unenhanced exam. Prior cholecystectomy. No evidence of biliary obstruction. Pancreas: No mass or inflammatory process visualized on this unenhanced exam. Spleen:  Within normal limits in size. Adrenals/Urinary tract: Stable 1.5 cm benign right adrenal adenoma. Mild vascular calcification again seen in right renal hilum. No evidence of hydroureteronephrosis. Although images through the lower pelvis are degraded by artifact from right hip prosthesis, there is a 2 mm radiodensity in the region of the distal right ureter on image 75/2 and 107/5, suspicious for tiny distal right ureteral calculus. No bladder calculi identified Stomach/Bowel: No evidence of obstruction, inflammatory process, or abnormal fluid collections. Vascular/Lymphatic: No pathologically enlarged lymph nodes identified. No evidence of abdominal aortic aneurysm. Aortic atherosclerosis. Reproductive: Prior hysterectomy noted. Adnexal regions are unremarkable in  appearance. Other: Stable small right upper quadrant ventral abdominal wall hernia containing only fat. Musculoskeletal:  No suspicious bone lesions identified. IMPRESSION: Image degradation due to artifact from right hip prosthesis, however there is a probable 2 mm distal right ureteral calculus. No significant hydroureteronephrosis or other acute findings. Stable small right upper quadrant ventral hernia containing only fat. Stable benign right adrenal adenoma and aortic atherosclerosis. Electronically Signed   By: Earle Gell M.D.   On: 04/21/2017 17:43    ____________________________________________    PROCEDURES  Procedure(s) performed:  Procedures    Medications  ondansetron (ZOFRAN-ODT) disintegrating tablet 4 mg (4 mg Oral Given 04/21/17 1846)     ____________________________________________   INITIAL IMPRESSION / ASSESSMENT AND PLAN / ED COURSE  Pertinent labs & imaging results that were available during my care of the patient were reviewed by me and considered in my medical decision making (see chart for details).  Review of the [place] CSRS was performed in accordance of the [place] prior to dispensing any controlled drugs.     Assessment and plan Pyelonephritis Patient presents to the emergency department with dysuria, increased urinary frequency and flank pain.  Patient has also had nausea.  Differential diagnosis originally included acute cystitis versus pyelonephritis versus nephrolithiasis.  CT renal stone study conducted in the emergency department reveals a 2 mm urethral stone but no other concerning findings.  Patient was discharged with Levaquin and Zofran.  Strict return precautions were given to return for new or worsening symptoms.  Vital signs were reassuring prior to discharge.   ____________________________________________  FINAL CLINICAL IMPRESSION(S) / ED DIAGNOSES  Final diagnoses:  Pyelonephritis      NEW MEDICATIONS STARTED DURING THIS  VISIT:  ED Discharge Orders        Ordered    cephALEXin (KEFLEX) 500 MG capsule  3 times daily,   Status:  Discontinued     04/21/17 1758    levofloxacin (LEVAQUIN) 750 MG tablet  Daily     04/21/17 1832    ondansetron (ZOFRAN) 4 MG tablet  Every 8 hours PRN     04/21/17 1845          This chart was dictated using voice recognition software/Dragon. Despite best efforts to proofread, errors can occur which can change the meaning. Any change was purely unintentional.    Lannie Fields, [name] 04/21/17 1847    Nance Pear, MD 04/21/17 [phone]

## 2017-04-21 NOTE — ED Triage Notes (Signed)
Frequency and pain with urination x 1 week.

## 2017-04-30 DIAGNOSIS — M545 Low back pain: Secondary | ICD-10-CM | POA: Diagnosis not present

## 2017-04-30 DIAGNOSIS — M5136 Other intervertebral disc degeneration, lumbar region: Secondary | ICD-10-CM | POA: Diagnosis not present

## 2017-04-30 DIAGNOSIS — Z5181 Encounter for therapeutic drug level monitoring: Secondary | ICD-10-CM | POA: Diagnosis not present

## 2017-04-30 DIAGNOSIS — Z96641 Presence of right artificial hip joint: Secondary | ICD-10-CM | POA: Diagnosis not present

## 2017-05-30 DIAGNOSIS — Z96641 Presence of right artificial hip joint: Secondary | ICD-10-CM | POA: Diagnosis not present

## 2017-05-30 DIAGNOSIS — M545 Low back pain: Secondary | ICD-10-CM | POA: Diagnosis not present

## 2017-05-30 DIAGNOSIS — Z5181 Encounter for therapeutic drug level monitoring: Secondary | ICD-10-CM | POA: Diagnosis not present

## 2017-05-30 DIAGNOSIS — M5136 Other intervertebral disc degeneration, lumbar region: Secondary | ICD-10-CM | POA: Diagnosis not present

## 2017-06-05 ENCOUNTER — Telehealth: Payer: Self-pay

## 2017-06-18 DIAGNOSIS — L97512 Non-pressure chronic ulcer of other part of right foot with fat layer exposed: Secondary | ICD-10-CM | POA: Diagnosis not present

## 2017-06-18 DIAGNOSIS — L97509 Non-pressure chronic ulcer of other part of unspecified foot with unspecified severity: Secondary | ICD-10-CM | POA: Diagnosis not present

## 2017-06-18 DIAGNOSIS — Z794 Long term (current) use of insulin: Secondary | ICD-10-CM | POA: Diagnosis not present

## 2017-06-18 DIAGNOSIS — E11621 Type 2 diabetes mellitus with foot ulcer: Secondary | ICD-10-CM | POA: Diagnosis not present

## 2017-06-18 DIAGNOSIS — D2371 Other benign neoplasm of skin of right lower limb, including hip: Secondary | ICD-10-CM | POA: Diagnosis not present

## 2017-06-18 DIAGNOSIS — E1142 Type 2 diabetes mellitus with diabetic polyneuropathy: Secondary | ICD-10-CM | POA: Diagnosis not present

## 2017-06-21 ENCOUNTER — Emergency Department: Payer: Medicare Other

## 2017-06-21 ENCOUNTER — Emergency Department
Admission: EM | Admit: 2017-06-21 | Discharge: 2017-06-21 | Disposition: A | Discharge status: Home / Self Care | Payer: Medicare Other | Attending: Emergency Medicine | Admitting: Emergency Medicine

## 2017-06-21 ENCOUNTER — Other Ambulatory Visit: Payer: Self-pay

## 2017-06-21 ENCOUNTER — Encounter: Payer: Self-pay | Admitting: Emergency Medicine

## 2017-06-21 DIAGNOSIS — I10 Essential (primary) hypertension: Secondary | ICD-10-CM | POA: Diagnosis not present

## 2017-06-21 DIAGNOSIS — Z96641 Presence of right artificial hip joint: Secondary | ICD-10-CM | POA: Insufficient documentation

## 2017-06-21 DIAGNOSIS — F17210 Nicotine dependence, cigarettes, uncomplicated: Secondary | ICD-10-CM | POA: Insufficient documentation

## 2017-06-21 DIAGNOSIS — N3000 Acute cystitis without hematuria: Secondary | ICD-10-CM | POA: Diagnosis not present

## 2017-06-21 DIAGNOSIS — M545 Low back pain: Secondary | ICD-10-CM | POA: Diagnosis not present

## 2017-06-21 DIAGNOSIS — R519 Headache, unspecified: Secondary | ICD-10-CM

## 2017-06-21 DIAGNOSIS — R51 Headache: Secondary | ICD-10-CM | POA: Insufficient documentation

## 2017-06-21 DIAGNOSIS — E1140 Type 2 diabetes mellitus with diabetic neuropathy, unspecified: Secondary | ICD-10-CM | POA: Diagnosis not present

## 2017-06-21 LAB — URINALYSIS, COMPLETE (UACMP) WITH MICROSCOPIC
Bilirubin Urine: NEGATIVE
GLUCOSE, UA: NEGATIVE mg/dL
KETONES UR: NEGATIVE mg/dL
Nitrite: NEGATIVE
Protein, ur: 100 mg/dL — AB
Specific Gravity, Urine: 1.013 (ref 1.005–1.030)
pH: 5 (ref 5.0–8.0)

## 2017-06-21 MED ORDER — METOCLOPRAMIDE HCL 5 MG/ML IJ SOLN
10.0000 mg | Freq: Once | INTRAMUSCULAR | Status: AC
  Administered 2017-06-21: 10 mg via INTRAVENOUS
  Filled 2017-06-21: qty 2

## 2017-06-21 MED ORDER — BUTALBITAL-APAP-CAFFEINE 50-325-40 MG PO TABS: 1.0000 | ORAL_TABLET | Freq: Four times a day (QID) | ORAL | 0 refills | Status: DC | PRN

## 2017-06-21 MED ORDER — SODIUM CHLORIDE 0.9 % IV SOLN
Freq: Once | INTRAVENOUS | Status: AC
  Administered 2017-06-21: 15:00:00 via INTRAVENOUS

## 2017-06-21 MED ORDER — NITROFURANTOIN MONOHYD MACRO 100 MG PO CAPS: 100.0000 mg | ORAL_CAPSULE | Freq: Two times a day (BID) | ORAL | 0 refills | Status: DC

## 2017-06-21 MED ORDER — DIPHENHYDRAMINE HCL 50 MG/ML IJ SOLN
25.0000 mg | Freq: Once | INTRAMUSCULAR | Status: AC
  Administered 2017-06-21: 25 mg via INTRAVENOUS
  Filled 2017-06-21: qty 1

## 2017-06-21 MED ORDER — KETOROLAC TROMETHAMINE 30 MG/ML IJ SOLN
30.0000 mg | Freq: Once | INTRAMUSCULAR | Status: AC
  Administered 2017-06-21: 30 mg via INTRAVENOUS
  Filled 2017-06-21: qty 1

## 2017-06-21 NOTE — ED Provider Notes (Addendum)
[facility] Emergency Department Provider Note       Time seen: ----------------------------------------- 2:47 PM on 06/21/2017 -----------------------------------------   I have reviewed the triage vital signs and the nursing notes.  HISTORY   Chief Complaint Headache and Back Pain    HPI Amy Meyers is a 57 y.o. female with a history of anxiety, diabetes, hypertension who presents to the ED for sided headache for several days.  Patient describes pain behind her right eye and some low back pain.  She is also concerned she may have a UTI.  She does describe some photophobia.  She states this is not the worst headache that she has had.  She denies fevers, chills or other complaints.  Past Medical History:  Diagnosis Date  . Anxiety   . Arthritis   . Diabetes mellitus without complication ([place])   . Diabetic foot ulcer ([place]) 04/06/2015  . Hypertension   . Kidney infection   . Open wound of right foot   . Staph infection    right foot    Patient Active Problem List   Diagnosis Date Noted  . Proteinuria 12/27/2016  . Callous ulcer ([place]) 05/11/2015  . Anxiety 04/13/2015  . Arthritis, degenerative 04/06/2015  . Neuropathy 04/06/2015  . Back pain, chronic 04/06/2015  . Abnormal ECG 03/17/2015  . Hypertension 03/17/2015  . NASH (nonalcoholic steatohepatitis) 03/17/2015  . Obesity 03/17/2015  . Palpitations 03/17/2015  . Pulmonary edema 03/17/2015  . PVC's (premature ventricular contractions) 03/17/2015  . Breath shortness 03/17/2015  . Headache, tension-type 03/17/2015  . Type 2 diabetes mellitus with right diabetic foot ulcer ([place]) 01/06/2015  . Facet syndrome, lumbar 11/26/2014  . Sacroiliac joint dysfunction 11/26/2014  . DDD (degenerative disc disease), thoracolumbar 09/29/2014  . Diabetes mellitus due to underlying condition with diabetic neuropathy ([place]) 09/29/2014  . 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 Osteomyelitis of lower leg ([place]) 03/11/2014  . Current tobacco use 12/19/2013  . Systemic inflammatory response syndrome (SIRS) ([place]) 12/16/2013  . Arthritis 12/16/2013    Past Surgical History:  Procedure Laterality Date  . ABDOMINAL ADHESION SURGERY    . ABDOMINAL HYSTERECTOMY  1998   with oophorectomy due to adhesions  . CARPAL TUNNEL RELEASE Bilateral   . [place]   x 2   . CHOLECYSTECTOMY  1981  . FOOT SURGERY Left   . JOINT REPLACEMENT     right hip replacement  . TONSILLECTOMY  1980  . TONSILLECTOMY    . upper GI X ray series  06/04/2001   Hiatal Hernia    Allergies Meloxicam and Sulfa antibiotics  Social History Social History   Tobacco Use  . Smoking status: Current Every Day Smoker    Packs/day: 1.00    Years: 44.00    Pack years: 44.00    Types: Cigarettes  . Smokeless tobacco: Current User    Types: Chew  Substance Use Topics  . Alcohol use: No    Alcohol/week: 0.0 oz  . Drug use: No    Review of Systems Constitutional: Negative for fever. Cardiovascular: Negative for chest pain. Respiratory: Negative for shortness of breath. Gastrointestinal: Negative for abdominal pain, vomiting and diarrhea. Genitourinary: Positive for dysuria Musculoskeletal: Positive for low back pain Skin: Negative for rash. Neurological: Positive for headache  All systems negative/normal/unremarkable except as stated in the HPI  ____________________________________________   PHYSICAL EXAM:  VITAL SIGNS: ED Triage Vitals  Enc Vitals Group  BP 06/21/17 1156 (!) 146/80     Pulse Rate 06/21/17 1156 88     Resp 06/21/17 1156 20     Temp 06/21/17 1156 98.6 F (37 C)     Temp Source 06/21/17 1156 Oral     SpO2 06/21/17 1156 92 %     Weight 06/21/17 1158 220 lb (99.8 kg)     Height --      Head Circumference --      Peak Flow --      Pain Score 06/21/17 1158 6     Pain Loc --      Pain Edu? --      Excl. in [place]? --      Constitutional: Alert and oriented. Well appearing and in no distress. Eyes: Conjunctivae are normal. Normal extraocular movements. ENT   Head: Normocephalic and atraumatic.   Nose: No congestion/rhinnorhea.   Mouth/Throat: Mucous membranes are moist.   Neck: No stridor. Cardiovascular: Normal rate, regular rhythm. No murmurs, rubs, or gallops. Respiratory: Normal respiratory effort without tachypnea nor retractions. Breath sounds are clear and equal bilaterally. No wheezes/rales/rhonchi. Gastrointestinal: Soft and nontender. Normal bowel sounds Musculoskeletal: Nontender with normal range of motion in extremities. No lower extremity tenderness nor edema. Neurologic:  Normal speech and language. No gross focal neurologic deficits are appreciated.  Skin:  Skin is warm, dry and intact. No rash noted. Psychiatric: Mood and affect are normal. Speech and behavior are normal.  ____________________________________________  ED COURSE:  As part of my medical decision making, I reviewed the following data within the [place] History obtained from family if available, nursing notes, old chart and ekg, as well as notes from prior ED visits. Patient presented for headache, we will assess with labs and imaging as indicated at this time.   Procedures ____________________________________________   LABS (pertinent positives/negatives)  Labs Reviewed  URINALYSIS, COMPLETE (UACMP) WITH MICROSCOPIC - Abnormal; Notable for the following components:      Result Value   Color, Urine YELLOW (*)    APPearance HAZY (*)    Hgb urine dipstick SMALL (*)    Protein, ur 100 (*)    Leukocytes, UA MODERATE (*)    Bacteria, UA RARE (*)    Squamous Epithelial / LPF 0-5 (*)    All other components within normal limits    RADIOLOGY  CT head is unremarkable  ____________________________________________  DIFFERENTIAL DIAGNOSIS   Migraine, tension headache, CVA, UTI,  pyelonephritis  FINAL ASSESSMENT AND PLAN  Headache, UTI   Plan: Patient had presented for headache which is likely a migraine and dysuria from likely UTI. Patient's labs did reveal likely urinary tract infection. Patient's imaging did not reveal any acute process.  She was given IV headache cocktail and has improved.  She does possibly have evidence of sleep apnea when observed sleeping in the room.  I will refer her to the sleep clinic for this.  She will be discharged with headache medication as well as medicine for urinary tract infection.  She is stable for outpatient follow-up.   Earleen Newport, MD   Note: This note was generated in part or whole with voice recognition software. Voice recognition is usually quite accurate but there are transcription errors that can and very often do occur. I apologize for any typographical errors that were not detected and corrected.     Earleen Newport, MD 06/21/17 1449    Earleen Newport, MD 06/21/17 [phone]

## 2017-06-21 NOTE — ED Triage Notes (Signed)
Pt c/o right sided headache for a couple of days behind her right eye and low back pain, possible UTI.

## 2017-06-21 NOTE — ED Notes (Signed)
Lights dimmed in room

## 2017-06-25 DIAGNOSIS — M545 Low back pain: Secondary | ICD-10-CM | POA: Diagnosis not present

## 2017-06-25 DIAGNOSIS — Z5181 Encounter for therapeutic drug level monitoring: Secondary | ICD-10-CM | POA: Diagnosis not present

## 2017-06-25 DIAGNOSIS — M5136 Other intervertebral disc degeneration, lumbar region: Secondary | ICD-10-CM | POA: Diagnosis not present

## 2017-06-25 DIAGNOSIS — Z96641 Presence of right artificial hip joint: Secondary | ICD-10-CM | POA: Diagnosis not present

## 2017-07-04 NOTE — Telephone Encounter (Signed)
Pt scheduled apt for 07/10/17.

## 2017-07-10 ENCOUNTER — Encounter: Payer: Medicare Other | Admitting: Family Medicine

## 2017-07-12 ENCOUNTER — Telehealth: Payer: Self-pay

## 2017-07-12 NOTE — Telephone Encounter (Signed)
Called pt to r/s missed AWV from 07/10/17. Pt was not available. Daughter states she is unable to r/s the apt for pt, but will have her call us back to do so.  -MM

## 2017-07-23 DIAGNOSIS — Z79891 Long term (current) use of opiate analgesic: Secondary | ICD-10-CM | POA: Diagnosis not present

## 2017-07-23 DIAGNOSIS — M5136 Other intervertebral disc degeneration, lumbar region: Secondary | ICD-10-CM | POA: Diagnosis not present

## 2017-07-23 DIAGNOSIS — Z5181 Encounter for therapeutic drug level monitoring: Secondary | ICD-10-CM | POA: Diagnosis not present

## 2017-07-23 DIAGNOSIS — E6609 Other obesity due to excess calories: Secondary | ICD-10-CM | POA: Diagnosis not present

## 2017-07-25 ENCOUNTER — Other Ambulatory Visit: Payer: Self-pay | Admitting: Family Medicine

## 2017-07-25 DIAGNOSIS — F419 Anxiety disorder, unspecified: Secondary | ICD-10-CM

## 2017-07-27 ENCOUNTER — Other Ambulatory Visit: Payer: Self-pay | Admitting: Family Medicine

## 2017-07-27 DIAGNOSIS — F419 Anxiety disorder, unspecified: Secondary | ICD-10-CM

## 2017-07-27 NOTE — Telephone Encounter (Signed)
Pharmacy requesting refills. Thanks!  

## 2017-07-31 NOTE — Telephone Encounter (Signed)
LM with daughter to have pt CB. -MM

## 2017-07-31 NOTE — Telephone Encounter (Signed)
This encounter was created in error - please disregard.

## 2017-08-20 DIAGNOSIS — Z79891 Long term (current) use of opiate analgesic: Secondary | ICD-10-CM | POA: Diagnosis not present

## 2017-08-20 DIAGNOSIS — M5136 Other intervertebral disc degeneration, lumbar region: Secondary | ICD-10-CM | POA: Diagnosis not present

## 2017-08-20 DIAGNOSIS — Z5181 Encounter for therapeutic drug level monitoring: Secondary | ICD-10-CM | POA: Diagnosis not present

## 2017-08-20 DIAGNOSIS — E6609 Other obesity due to excess calories: Secondary | ICD-10-CM | POA: Diagnosis not present

## 2017-08-23 ENCOUNTER — Other Ambulatory Visit: Payer: Self-pay | Admitting: Family Medicine

## 2017-08-23 DIAGNOSIS — F419 Anxiety disorder, unspecified: Secondary | ICD-10-CM

## 2017-08-27 NOTE — Telephone Encounter (Signed)
Scheduled 09/05/17 @ 1:20 and 2:00 PM. -MM

## 2017-09-05 ENCOUNTER — Ambulatory Visit: Payer: Self-pay

## 2017-09-05 ENCOUNTER — Encounter: Payer: Self-pay | Admitting: Family Medicine

## 2017-09-05 NOTE — Progress Notes (Deleted)
[name] Female    DOB: 11-Sep-1960   57 y.o.   MRN: [id number] Visit Date: 09/05/2017  Today's Provider: Lelon Huh, MD   No chief complaint on file.  Subjective:   Patient saw [facility] for AWV today at 1:20 pm.  HPI   Diabetes Mellitus Type II, Follow-up:   Lab Results  Component Value Date   HGBA1C 7.7 11/02/2016   HGBA1C 7.6 04/06/2015   HGBA1C 6.7 (A) 01/28/2014   Last seen for diabetes 10 months ago.  Management since then includes; advised to check blood sugar and inject Novolog per SS . She reports {excellent/good/fair/poor:19665} compliance with treatment. She {ACTION; IS/IS IFO:27741287} having side effects. *** Current symptoms include {Symptoms; diabetes:14075} and have been {Desc; course:15616}. Home blood sugar records: {diabetes glucometry results:16657}  Episodes of hypoglycemia? {yes***/no:17258}   Current Insulin Regimen: *** Most Recent Eye Exam: *** Weight trend: {trend:16658} Prior visit with dietician: {yes/no:17258} Current diet: {diet habits:16563} Current exercise: {exercise types:16438}  ------------------------------------------------------------------------   Hypertension, follow-up:  BP Readings from Last 3 Encounters:  06/21/17 132/71  04/21/17 (!) 119/51  11/02/16 130/70    She was last seen for hypertension {NUMBERS 1-12:18279} {days/wks/mos/yrs:310907} ago.  BP at that visit was ***. Management since that visit includes ***.She reports {excellent/good/fair/poor:19665} compliance with treatment. She {ACTION; IS/IS OMV:67209470} having side effects. *** She {is/is not:9024} exercising. She {is/is not:9024} adherent to low salt diet.   Outside blood pressures are ***. She is experiencing {Symptoms; cardiac:12860}.  Patient denies {Symptoms; cardiac:12860}.   Cardiovascular risk factors include {cv risk factors:510}.  Use of agents associated with hypertension: {bp agents assoc with  hypertension:511::"none"}.   ------------------------------------------------------------------------  Current  tobacco use From 11/02/2016-Counseled health benefits of tobacco cessation.   Allergies  Allergen Reactions  . Meloxicam Itching, Swelling and Nausea And Vomiting  . Sulfa Antibiotics Other (See Comments)     Current Outpatient Medications:  .  aspirin 325 MG tablet, Take 325 mg by mouth daily. Reported on 07/30/2015, Disp: , Rfl:  .  butalbital-acetaminophen-caffeine (FIORICET, ESGIC) 50-325-40 MG tablet, Take 1-2 tablets by mouth every 6 (six) hours as needed for headache., Disp: 20 tablet, Rfl: 0 .  insulin aspart (NOVOLOG) 100 UNIT/ML FlexPen, Inject before meals anywhere from 0 to 20 units, three times daily due to sliding scale., Disp: 15 mL, Rfl: 11 .  insulin glargine (LANTUS) 100 UNIT/ML injection, Inject 1 mL (100 Units total) into the skin 2 (two) times daily., Disp: 4500 mL, Rfl: 3 .  LORazepam (ATIVAN) 1 MG tablet, TAKE ONE TABLET BY MOUTH TWICE DAILY AS NEEDED, Disp: 10 tablet, Rfl: 0 .  methadone (DOLOPHINE) 10 MG tablet, Limit 5-7 tabs by mouth per day if tolerated (Patient taking differently: Take 10 mg by mouth. Limit 5-7 tabs by mouth per day if tolerated), Disp: 210 tablet, Rfl: 0 .  nitrofurantoin, macrocrystal-monohydrate, (MACROBID) 100 MG capsule, Take 1 capsule (100 mg total) by mouth 2 (two) times daily., Disp: 20 capsule, Rfl: 0 .  ondansetron (ZOFRAN ODT) 4 MG disintegrating tablet, Take 1 tablet (4 mg total) by mouth every 8 (eight) hours as needed for nausea or vomiting., Disp: 20 tablet, Rfl: 0 .  oxyCODONE (OXY IR/ROXICODONE) 5 MG immediate release tablet, Limit 4 - 6 tabs po / day for breakthrough pain while taking methadone if tolerated, Disp: 180 tablet, Rfl: 0 .  promethazine (PHENERGAN) 25 MG tablet, Take 1 tablet (25 mg total) by mouth every 6 (six) hours as needed for  nausea or vomiting., Disp: 20 tablet, Rfl: 0  Review of Systems  Social  History   Tobacco Use  . Smoking status: Current Every Day Smoker    Packs/day: 1.00    Years: 44.00    Pack years: 44.00    Types: Cigarettes  . Smokeless tobacco: Current User    Types: Chew  Substance Use Topics  . Alcohol use: No    Alcohol/week: 0.0 oz   Objective:   There were no vitals taken for this visit. There were no vitals filed for this visit.   Physical Exam      Assessment & Plan:           Lelon Huh, MD  [facility]

## 2017-09-11 ENCOUNTER — Telehealth: Payer: Self-pay

## 2017-09-11 NOTE — Telephone Encounter (Signed)
LMTCB and schedule AWV.  -MM 

## 2017-09-17 DIAGNOSIS — E6609 Other obesity due to excess calories: Secondary | ICD-10-CM | POA: Diagnosis not present

## 2017-09-17 DIAGNOSIS — M5136 Other intervertebral disc degeneration, lumbar region: Secondary | ICD-10-CM | POA: Diagnosis not present

## 2017-09-17 DIAGNOSIS — Z79891 Long term (current) use of opiate analgesic: Secondary | ICD-10-CM | POA: Diagnosis not present

## 2017-09-17 DIAGNOSIS — Z5181 Encounter for therapeutic drug level monitoring: Secondary | ICD-10-CM | POA: Diagnosis not present

## 2017-09-26 ENCOUNTER — Ambulatory Visit: Payer: Medicare Other | Admitting: Family Medicine

## 2017-09-26 DIAGNOSIS — E6609 Other obesity due to excess calories: Secondary | ICD-10-CM | POA: Diagnosis not present

## 2017-09-26 DIAGNOSIS — Z5181 Encounter for therapeutic drug level monitoring: Secondary | ICD-10-CM | POA: Diagnosis not present

## 2017-09-26 DIAGNOSIS — Z79891 Long term (current) use of opiate analgesic: Secondary | ICD-10-CM | POA: Diagnosis not present

## 2017-09-26 DIAGNOSIS — M5136 Other intervertebral disc degeneration, lumbar region: Secondary | ICD-10-CM | POA: Diagnosis not present

## 2017-09-28 ENCOUNTER — Ambulatory Visit (INDEPENDENT_AMBULATORY_CARE_PROVIDER_SITE_OTHER): Payer: Medicare Other | Admitting: Family Medicine

## 2017-09-28 ENCOUNTER — Encounter: Payer: Self-pay | Admitting: Family Medicine

## 2017-09-28 VITALS — BP 128/58 | HR 82 | Temp 98.2°F | Resp 16 | Wt 227.0 lb

## 2017-09-28 DIAGNOSIS — N390 Urinary tract infection, site not specified: Secondary | ICD-10-CM

## 2017-09-28 DIAGNOSIS — Z794 Long term (current) use of insulin: Secondary | ICD-10-CM | POA: Diagnosis not present

## 2017-09-28 DIAGNOSIS — K469 Unspecified abdominal hernia without obstruction or gangrene: Secondary | ICD-10-CM

## 2017-09-28 DIAGNOSIS — E1140 Type 2 diabetes mellitus with diabetic neuropathy, unspecified: Secondary | ICD-10-CM

## 2017-09-28 DIAGNOSIS — H539 Unspecified visual disturbance: Secondary | ICD-10-CM | POA: Diagnosis not present

## 2017-09-28 DIAGNOSIS — R42 Dizziness and giddiness: Secondary | ICD-10-CM | POA: Diagnosis not present

## 2017-09-28 DIAGNOSIS — K760 Fatty (change of) liver, not elsewhere classified: Secondary | ICD-10-CM | POA: Diagnosis not present

## 2017-09-28 LAB — POCT GLYCOSYLATED HEMOGLOBIN (HGB A1C)
Est. average glucose Bld gHb Est-mCnc: 194
HEMOGLOBIN A1C: 8.4

## 2017-09-28 LAB — POCT URINALYSIS DIPSTICK
BILIRUBIN UA: NEGATIVE
Glucose, UA: NEGATIVE
Nitrite, UA: NEGATIVE
ODOR: NORMAL
PH UA: 5 (ref 5.0–8.0)
Spec Grav, UA: 1.02 (ref 1.010–1.025)
Urobilinogen, UA: 0.2 E.U./dL

## 2017-09-28 LAB — POCT UA - MICROALBUMIN: Microalbumin Ur, POC: 100 mg/L

## 2017-09-28 MED ORDER — ONETOUCH ULTRA MINI W/DEVICE KIT: PACK | 0 refills | Status: DC

## 2017-09-28 MED ORDER — PIOGLITAZONE HCL 15 MG PO TABS
15.0000 mg | ORAL_TABLET | Freq: Every day | ORAL | 1 refills | Status: DC
Start: 2017-09-28 — End: 2021-10-04

## 2017-09-28 MED ORDER — AMOXICILLIN 500 MG PO CAPS: 500.0000 mg | ORAL_CAPSULE | Freq: Three times a day (TID) | ORAL | 0 refills | Status: AC

## 2017-09-28 NOTE — Addendum Note (Signed)
Addended by: Julieta Bellini on: 09/28/2017 01:32 PM   Modules accepted: Orders

## 2017-09-28 NOTE — Progress Notes (Signed)
[name] Female    DOB: [birth year]   57 y.o.   MRN: [id number] Visit Date: 09/28/2017  Today's Provider: Lelon Huh, MD   Chief Complaint  Patient presents with  . Dizziness   Subjective:    Dizziness  This is a new problem. The current episode started in the past 7 days (3-4 days). Associated symptoms include abdominal pain, diaphoresis, headaches, vertigo and a visual change. Pertinent negatives include no anorexia, arthralgias, change in bowel habit, chest pain, chills, congestion, coughing, fatigue, fever, joint swelling, myalgias, nausea, neck pain, numbness, rash, sore throat, swollen glands, vomiting or weakness. Exacerbated by: when laying down.   Patient has had dizziness for past 3-4 days, mostly when she is laying down or occasionally when she moves to quickly which triggers spinning sensation Patient states she often gets a headache after dizziness begins. Patient also has symptoms of visual disturbance in her left eye followed by headache. She was seen in ER in January with headache and pain around eye and had CT head shwoing chronic small vessel ischemia. Denies any focal weakness or nubmenss.   Patient also has burning and itching in vaginal area with polyuria for several day.   Also complains of abdominal pain recurrently, no BM changesPatient also has a hernia on right side upper abdomen. Patient states it's been there for years, but she wanted to get it looked at while she's here. It has gotten bigger over time and is sore at times.   Follow up diabetes: Last a1c in June of 2018 was 7.7. She states she does not check blood sugar due to current meter not working. She takes 100 units lantus twice every day and sometimes takes Novolog before meals, but guesses on dose since she doesn't have meter.     Allergies  Allergen Reactions  . Meloxicam Itching, Swelling and Nausea And Vomiting  . Sulfa Antibiotics Other (See Comments)     Current Outpatient  Medications:  .  aspirin 325 MG tablet, Take 325 mg by mouth daily. Reported on 07/30/2015, Disp: , Rfl:  .  insulin aspart (NOVOLOG) 100 UNIT/ML FlexPen, Inject before meals anywhere from 0 to 20 units, three times daily due to sliding scale., Disp: 15 mL, Rfl: 11 .  insulin glargine (LANTUS) 100 UNIT/ML injection, Inject 1 mL (100 Units total) into the skin 2 (two) times daily., Disp: 4500 mL, Rfl: 3 .  LORazepam (ATIVAN) 1 MG tablet, TAKE ONE TABLET BY MOUTH TWICE DAILY AS NEEDED, Disp: 10 tablet, Rfl: 0 .  methadone (DOLOPHINE) 10 MG tablet, Limit 5-7 tabs by mouth per day if tolerated (Patient taking differently: Take 10 mg by mouth. Limit 5-7 tabs by mouth per day if tolerated), Disp: 210 tablet, Rfl: 0 .  oxyCODONE (OXY IR/ROXICODONE) 5 MG immediate release tablet, Limit 4 - 6 tabs po / day for breakthrough pain while taking methadone if tolerated, Disp: 180 tablet, Rfl: 0 .  butalbital-acetaminophen-caffeine (FIORICET, ESGIC) 50-325-40 MG tablet, Take 1-2 tablets by mouth every 6 (six) hours as needed for headache. (Patient not taking: Reported on 09/28/2017), Disp: 20 tablet, Rfl: 0 .  ondansetron (ZOFRAN ODT) 4 MG disintegrating tablet, Take 1 tablet (4 mg total) by mouth every 8 (eight) hours as needed for nausea or vomiting. (Patient not taking: Reported on 09/28/2017), Disp: 20 tablet, Rfl: 0 .  promethazine (PHENERGAN) 25 MG tablet, Take 1 tablet (25 mg total) by mouth every 6 (six) hours as needed for nausea  or vomiting. (Patient not taking: Reported on 09/28/2017), Disp: 20 tablet, Rfl: 0  Review of Systems  Constitutional: Positive for diaphoresis. Negative for appetite change, chills, fatigue and fever.  HENT: Negative for congestion and sore throat.   Respiratory: Negative for cough, chest tightness and shortness of breath.   Cardiovascular: Negative for chest pain and palpitations.  Gastrointestinal: Positive for abdominal pain. Negative for anorexia, change in bowel habit, nausea and  vomiting.  Genitourinary: Positive for dysuria.  Musculoskeletal: Negative for arthralgias, joint swelling, myalgias and neck pain.  Skin: Negative for rash.  Neurological: Positive for dizziness, vertigo and headaches. Negative for weakness and numbness.    Social History   Tobacco Use  . Smoking status: Current Every Day Smoker    Packs/day: 1.00    Years: 44.00    Pack years: 44.00    Types: Cigarettes  . Smokeless tobacco: Current User    Types: Chew  Substance Use Topics  . Alcohol use: No    Alcohol/week: 0.0 oz   Objective:   BP (!) 128/58 (BP Location: Left Arm, Patient Position: Sitting, Cuff Size: Large)   Pulse 82   Temp 98.2 F (36.8 C) (Oral)   Resp 16   Wt 227 lb (103 kg)   SpO2 94%   BMI 36.64 kg/m  Vitals:   09/28/17 1140  BP: (!) 128/58  Pulse: 82  Resp: 16  Temp: 98.2 F (36.8 C)  TempSrc: Oral  SpO2: 94%  Weight: 227 lb (103 kg)     Physical Exam   General Appearance:    Alert, cooperative, no distress  Eyes:    PERRL, conjunctiva/corneas clear, EOM's intact   No sinus tenderness.   Lungs:     Clear to auscultation bilaterally, respirations unlabored  Abd:   Medium sized abdominal wall hernia underlying healed open cholecystectomy scar. Slightly tender to touch, not inflamed. Mild suprapubic tenderness. No CVA tenderness.   Heart:    Regular rate and rhythm  Neurologic:   Awake, alert, oriented x 3. No apparent focal neurological           defect.       Results for orders placed or performed in visit on 09/28/17  POCT glycosylated hemoglobin (Hb A1C)  Result Value Ref Range   Hemoglobin A1C 8.4    Est. average glucose Bld gHb Est-mCnc 194   POCT urinalysis dipstick  Result Value Ref Range   Color, UA Yellow    Clarity, UA Slightly cloudy    Glucose, UA Neg    Bilirubin, UA Neg    Ketones, UA Trace    Spec Grav, UA 1.020 1.010 - 1.025   Blood, UA Small    pH, UA 5.0 5.0 - 8.0   Protein, UA 300+++    Urobilinogen, UA 0.2 0.2 or  1.0 E.U./dL   Nitrite, UA Neg    Leukocytes, UA Moderate (2+) (A) Negative   Appearance Cloudy    Odor Normal   POCT UA - Microalbumin  Result Value Ref Range   Microalbumin Ur, POC 100 mg/L       Assessment & Plan:     1. Type 2 diabetes mellitus with diabetic neuropathy, with long-term current use of insulin (HCC) Uncontrolled. Counseled importance of monitoring blood sugar due to risk of hypoglycemia on high doses of insulin. New prescription for monitored sent to pharmacy. Add lo dose pioglitazone and anticipated following up to reviewing blood sugars in 1-2 months.   - POCT glycosylated hemoglobin (Hb   A1C) - Comprehensive metabolic panel - Blood Glucose Monitoring Suppl (ONE TOUCH ULTRA MINI) w/Device KIT; Use to check blood sugar once a day, Dm2 E11.65  Dispense: 1 each; Refill: 0 - pioglitazone (ACTOS) 15 MG tablet; Take 1 tablet (15 mg total) by mouth daily.  Dispense: 30 tablet; Refill: 1  2. NAFLD (nonalcoholic fatty liver disease)   3. Urinary tract infection without hematuria, site unspecified  - POCT urinalysis dipstick - POCT UA - Microalbumin - amoxicillin (AMOXIL) 500 MG capsule; Take 1 capsule (500 mg total) by mouth 3 (three) times daily for 7 days.  Dispense: 21 capsule; Refill: 0  4. Non-recurrent abdominal hernia without obstruction or gangrene, unspecified hernia type Likely hernia of surgical incision which is enlarging and becoming more bothersome.  - Ambulatory referral to General Surgery  5. 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  - Ambulatory referral to Ophthalmology  6. Dizziness Multifactorial, likely exacerbated by acute UTI as above, not sure what her fluid status is considering her uncontrolled diabetes. Encouraged to push fluids while awaiting lab results.  - Comprehensive metabolic panel - CBC  7. Vertigo   8. Morbid obesity ([facility]) Counseled  regarding prudent diet and regular exercise.   Addressed extensive list of chronic and acute medical problems today requiring extensive time in counseling and coordination of care.  Over half of this 45 minute visit were spent in counseling and coordinating care of multiple medical problems.        Lelon Huh, MD  [facility]

## 2017-09-29 LAB — COMPREHENSIVE METABOLIC PANEL
A/G RATIO: 1.2 (ref 1.2–2.2)
ALK PHOS: 73 IU/L (ref 39–117)
ALT: 19 IU/L (ref 0–32)
AST: 16 IU/L (ref 0–40)
Albumin: 4 g/dL (ref 3.5–5.5)
BILIRUBIN TOTAL: 0.4 mg/dL (ref 0.0–1.2)
BUN/Creatinine Ratio: 26 — ABNORMAL HIGH (ref 9–23)
BUN: 18 mg/dL (ref 6–24)
CHLORIDE: 96 mmol/L (ref 96–106)
CO2: 27 mmol/L (ref 20–29)
Calcium: 9.4 mg/dL (ref 8.7–10.2)
Creatinine, Ser: 0.68 mg/dL (ref 0.57–1.00)
GFR calc Af Amer: 111 mL/min/{1.73_m2} (ref >59)
GFR calc non Af Amer: 96 mL/min/{1.73_m2} (ref >59)
Globulin, Total: 3.3 g/dL (ref 1.5–4.5)
Glucose: 111 mg/dL — ABNORMAL HIGH (ref 65–99)
POTASSIUM: 4.7 mmol/L (ref 3.5–5.2)
Sodium: 139 mmol/L (ref 134–144)
Total Protein: 7.3 g/dL (ref 6.0–8.5)

## 2017-09-29 LAB — CBC
Hematocrit: 45.1 % (ref 34.0–46.6)
Hemoglobin: 15.1 g/dL (ref 11.1–15.9)
MCH: 28.5 pg (ref 26.6–33.0)
MCHC: 33.5 g/dL (ref 31.5–35.7)
MCV: 85 fL (ref 79–97)
PLATELETS: 176 10*3/uL (ref 150–379)
RBC: 5.3 x10E6/uL — AB (ref 3.77–5.28)
RDW: 15.8 % — ABNORMAL HIGH (ref 12.3–15.4)
WBC: 10.7 10*3/uL (ref 3.4–10.8)

## 2017-10-01 ENCOUNTER — Other Ambulatory Visit: Payer: Self-pay | Admitting: Family Medicine

## 2017-10-01 LAB — CULTURE, URINE COMPREHENSIVE

## 2017-10-01 NOTE — Telephone Encounter (Signed)
Rx was already sent to pharmacy

## 2017-10-01 NOTE — Telephone Encounter (Signed)
[facility] pharmacy faxed a refill request for the following. Thanks CC  Blood Glucose Monitoring Suppl (ONE TOUCH ULTRA MINI) w/Device KIT

## 2017-10-02 ENCOUNTER — Other Ambulatory Visit: Payer: Self-pay | Admitting: *Deleted

## 2017-10-02 DIAGNOSIS — Z79899 Other long term (current) drug therapy: Secondary | ICD-10-CM | POA: Diagnosis not present

## 2017-10-02 DIAGNOSIS — Z794 Long term (current) use of insulin: Secondary | ICD-10-CM | POA: Diagnosis not present

## 2017-10-02 DIAGNOSIS — Z888 Allergy status to other drugs, medicaments and biological substances status: Secondary | ICD-10-CM | POA: Diagnosis not present

## 2017-10-02 DIAGNOSIS — Z882 Allergy status to sulfonamides status: Secondary | ICD-10-CM | POA: Diagnosis not present

## 2017-10-02 DIAGNOSIS — E119 Type 2 diabetes mellitus without complications: Secondary | ICD-10-CM | POA: Diagnosis not present

## 2017-10-02 DIAGNOSIS — H8112 Benign paroxysmal vertigo, left ear: Secondary | ICD-10-CM | POA: Diagnosis not present

## 2017-10-02 DIAGNOSIS — F17210 Nicotine dependence, cigarettes, uncomplicated: Secondary | ICD-10-CM | POA: Diagnosis not present

## 2017-10-02 NOTE — Telephone Encounter (Signed)
They need to find out which one is covered, there are many different brands.

## 2017-10-02 NOTE — Telephone Encounter (Signed)
Patient's daughter stated the glucose meter one touch was not covered by pt's insurance. Requesting an rx for a different meter. Please advise?

## 2017-10-03 NOTE — Telephone Encounter (Signed)
LMOVM for pt to return call 

## 2017-10-03 NOTE — Telephone Encounter (Signed)
Pt advised.

## 2017-10-03 NOTE — Telephone Encounter (Signed)
Pt has r/s the AWV twice. Tried to contact pt on several different occasions and pt has not CB to r/s AWV. Closing encounter. -MM

## 2017-10-10 ENCOUNTER — Encounter: Payer: Self-pay | Admitting: General Surgery

## 2017-10-24 DIAGNOSIS — E6609 Other obesity due to excess calories: Secondary | ICD-10-CM | POA: Diagnosis not present

## 2017-10-24 DIAGNOSIS — Z5181 Encounter for therapeutic drug level monitoring: Secondary | ICD-10-CM | POA: Diagnosis not present

## 2017-10-24 DIAGNOSIS — M5136 Other intervertebral disc degeneration, lumbar region: Secondary | ICD-10-CM | POA: Diagnosis not present

## 2017-10-24 DIAGNOSIS — Z79891 Long term (current) use of opiate analgesic: Secondary | ICD-10-CM | POA: Diagnosis not present

## 2017-11-20 DIAGNOSIS — M5136 Other intervertebral disc degeneration, lumbar region: Secondary | ICD-10-CM | POA: Diagnosis not present

## 2017-11-20 DIAGNOSIS — E6609 Other obesity due to excess calories: Secondary | ICD-10-CM | POA: Diagnosis not present

## 2017-11-20 DIAGNOSIS — Z5181 Encounter for therapeutic drug level monitoring: Secondary | ICD-10-CM | POA: Diagnosis not present

## 2017-11-20 DIAGNOSIS — Z79891 Long term (current) use of opiate analgesic: Secondary | ICD-10-CM | POA: Diagnosis not present

## 2017-11-26 ENCOUNTER — Telehealth: Payer: Self-pay | Admitting: Family Medicine

## 2017-11-26 NOTE — Telephone Encounter (Signed)
I left a message asking the pt to call and schedule AWV. VDM (DD) °

## 2017-12-04 ENCOUNTER — Other Ambulatory Visit: Payer: Self-pay | Admitting: Family Medicine

## 2017-12-04 DIAGNOSIS — E1140 Type 2 diabetes mellitus with diabetic neuropathy, unspecified: Secondary | ICD-10-CM

## 2017-12-04 DIAGNOSIS — Z794 Long term (current) use of insulin: Principal | ICD-10-CM

## 2017-12-05 ENCOUNTER — Ambulatory Visit: Payer: Self-pay | Admitting: Family Medicine

## 2017-12-07 ENCOUNTER — Ambulatory Visit: Payer: Medicare Other | Admitting: Family Medicine

## 2017-12-18 DIAGNOSIS — E6609 Other obesity due to excess calories: Secondary | ICD-10-CM | POA: Diagnosis not present

## 2017-12-18 DIAGNOSIS — Z5181 Encounter for therapeutic drug level monitoring: Secondary | ICD-10-CM | POA: Diagnosis not present

## 2017-12-18 DIAGNOSIS — M19012 Primary osteoarthritis, left shoulder: Secondary | ICD-10-CM | POA: Diagnosis not present

## 2017-12-18 DIAGNOSIS — Z79891 Long term (current) use of opiate analgesic: Secondary | ICD-10-CM | POA: Diagnosis not present

## 2018-01-15 DIAGNOSIS — E6609 Other obesity due to excess calories: Secondary | ICD-10-CM | POA: Diagnosis not present

## 2018-01-15 DIAGNOSIS — Z5181 Encounter for therapeutic drug level monitoring: Secondary | ICD-10-CM | POA: Diagnosis not present

## 2018-01-15 DIAGNOSIS — Z79891 Long term (current) use of opiate analgesic: Secondary | ICD-10-CM | POA: Diagnosis not present

## 2018-01-15 DIAGNOSIS — M19012 Primary osteoarthritis, left shoulder: Secondary | ICD-10-CM | POA: Diagnosis not present

## 2018-02-13 DIAGNOSIS — Z5181 Encounter for therapeutic drug level monitoring: Secondary | ICD-10-CM | POA: Diagnosis not present

## 2018-02-13 DIAGNOSIS — M19012 Primary osteoarthritis, left shoulder: Secondary | ICD-10-CM | POA: Diagnosis not present

## 2018-02-13 DIAGNOSIS — Z79891 Long term (current) use of opiate analgesic: Secondary | ICD-10-CM | POA: Diagnosis not present

## 2018-02-13 DIAGNOSIS — E6609 Other obesity due to excess calories: Secondary | ICD-10-CM | POA: Diagnosis not present

## 2018-03-13 DIAGNOSIS — Z79891 Long term (current) use of opiate analgesic: Secondary | ICD-10-CM | POA: Diagnosis not present

## 2018-03-13 DIAGNOSIS — Z5181 Encounter for therapeutic drug level monitoring: Secondary | ICD-10-CM | POA: Diagnosis not present

## 2018-03-13 DIAGNOSIS — M19012 Primary osteoarthritis, left shoulder: Secondary | ICD-10-CM | POA: Diagnosis not present

## 2018-03-13 DIAGNOSIS — E6609 Other obesity due to excess calories: Secondary | ICD-10-CM | POA: Diagnosis not present

## 2018-04-03 DIAGNOSIS — Z79891 Long term (current) use of opiate analgesic: Secondary | ICD-10-CM | POA: Diagnosis not present

## 2018-04-03 DIAGNOSIS — E6609 Other obesity due to excess calories: Secondary | ICD-10-CM | POA: Diagnosis not present

## 2018-04-03 DIAGNOSIS — M19012 Primary osteoarthritis, left shoulder: Secondary | ICD-10-CM | POA: Diagnosis not present

## 2018-04-03 DIAGNOSIS — Z5181 Encounter for therapeutic drug level monitoring: Secondary | ICD-10-CM | POA: Diagnosis not present

## 2018-04-11 ENCOUNTER — Encounter: Payer: Self-pay | Admitting: *Deleted

## 2018-04-11 ENCOUNTER — Other Ambulatory Visit: Payer: Self-pay

## 2018-04-11 ENCOUNTER — Emergency Department: Payer: No Typology Code available for payment source

## 2018-04-11 ENCOUNTER — Emergency Department
Admission: EM | Admit: 2018-04-11 | Discharge: 2018-04-11 | Disposition: A | Discharge status: Home / Self Care | Payer: No Typology Code available for payment source | Attending: Emergency Medicine | Admitting: Emergency Medicine

## 2018-04-11 DIAGNOSIS — Z794 Long term (current) use of insulin: Secondary | ICD-10-CM | POA: Insufficient documentation

## 2018-04-11 DIAGNOSIS — E119 Type 2 diabetes mellitus without complications: Secondary | ICD-10-CM | POA: Insufficient documentation

## 2018-04-11 DIAGNOSIS — F17210 Nicotine dependence, cigarettes, uncomplicated: Secondary | ICD-10-CM | POA: Insufficient documentation

## 2018-04-11 DIAGNOSIS — Z79899 Other long term (current) drug therapy: Secondary | ICD-10-CM | POA: Diagnosis not present

## 2018-04-11 DIAGNOSIS — Y92410 Unspecified street and highway as the place of occurrence of the external cause: Secondary | ICD-10-CM | POA: Insufficient documentation

## 2018-04-11 DIAGNOSIS — S3992XA Unspecified injury of lower back, initial encounter: Secondary | ICD-10-CM | POA: Diagnosis present

## 2018-04-11 DIAGNOSIS — S39012A Strain of muscle, fascia and tendon of lower back, initial encounter: Secondary | ICD-10-CM | POA: Insufficient documentation

## 2018-04-11 DIAGNOSIS — Y939 Activity, unspecified: Secondary | ICD-10-CM | POA: Insufficient documentation

## 2018-04-11 DIAGNOSIS — Z7982 Long term (current) use of aspirin: Secondary | ICD-10-CM | POA: Diagnosis not present

## 2018-04-11 DIAGNOSIS — I10 Essential (primary) hypertension: Secondary | ICD-10-CM | POA: Diagnosis not present

## 2018-04-11 DIAGNOSIS — E1140 Type 2 diabetes mellitus with diabetic neuropathy, unspecified: Secondary | ICD-10-CM | POA: Insufficient documentation

## 2018-04-11 DIAGNOSIS — Y999 Unspecified external cause status: Secondary | ICD-10-CM | POA: Diagnosis not present

## 2018-04-11 DIAGNOSIS — M545 Low back pain: Secondary | ICD-10-CM | POA: Diagnosis not present

## 2018-04-11 DIAGNOSIS — Z96641 Presence of right artificial hip joint: Secondary | ICD-10-CM | POA: Insufficient documentation

## 2018-04-11 MED ORDER — ACETAMINOPHEN 500 MG PO TABS
1000.0000 mg | ORAL_TABLET | Freq: Once | ORAL | Status: AC
  Administered 2018-04-11: 1000 mg via ORAL
  Filled 2018-04-11: qty 2

## 2018-04-11 MED ORDER — METHOCARBAMOL 500 MG PO TABS: 500.0000 mg | ORAL_TABLET | Freq: Three times a day (TID) | ORAL | 0 refills | Status: DC | PRN

## 2018-04-11 NOTE — Discharge Instructions (Addendum)
Follow-up with your primary care provider if any continued problems.  Continue with your regular medication.  Take Robaxin 500 mg every 8 hours as needed for muscle spasms.  You may also use moist heat or ice to your back as needed for discomfort.  If any continued pain medication is needed you will need to follow-up with your primary care provider.  You will feel worse tomorrow than you are currently as you will be stiff and sore tomorrow.
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
## 2018-05-08 DIAGNOSIS — Z79891 Long term (current) use of opiate analgesic: Secondary | ICD-10-CM | POA: Diagnosis not present

## 2018-05-08 DIAGNOSIS — E6609 Other obesity due to excess calories: Secondary | ICD-10-CM | POA: Diagnosis not present

## 2018-05-08 DIAGNOSIS — Z5181 Encounter for therapeutic drug level monitoring: Secondary | ICD-10-CM | POA: Diagnosis not present

## 2018-05-08 DIAGNOSIS — M19012 Primary osteoarthritis, left shoulder: Secondary | ICD-10-CM | POA: Diagnosis not present

## 2018-06-06 ENCOUNTER — Other Ambulatory Visit: Payer: Self-pay

## 2018-06-10 DIAGNOSIS — Z5181 Encounter for therapeutic drug level monitoring: Secondary | ICD-10-CM | POA: Diagnosis not present

## 2018-06-10 DIAGNOSIS — Z79891 Long term (current) use of opiate analgesic: Secondary | ICD-10-CM | POA: Diagnosis not present

## 2018-06-10 DIAGNOSIS — M19012 Primary osteoarthritis, left shoulder: Secondary | ICD-10-CM | POA: Diagnosis not present

## 2018-06-10 DIAGNOSIS — E6609 Other obesity due to excess calories: Secondary | ICD-10-CM | POA: Diagnosis not present

## 2018-07-08 DIAGNOSIS — E6609 Other obesity due to excess calories: Secondary | ICD-10-CM | POA: Diagnosis not present

## 2018-07-08 DIAGNOSIS — Z5181 Encounter for therapeutic drug level monitoring: Secondary | ICD-10-CM | POA: Diagnosis not present

## 2018-07-08 DIAGNOSIS — Z79891 Long term (current) use of opiate analgesic: Secondary | ICD-10-CM | POA: Diagnosis not present

## 2018-07-08 DIAGNOSIS — M19012 Primary osteoarthritis, left shoulder: Secondary | ICD-10-CM | POA: Diagnosis not present

## 2018-08-05 DIAGNOSIS — M4807 Spinal stenosis, lumbosacral region: Secondary | ICD-10-CM | POA: Diagnosis not present

## 2018-08-05 DIAGNOSIS — M19012 Primary osteoarthritis, left shoulder: Secondary | ICD-10-CM | POA: Diagnosis not present

## 2018-08-05 DIAGNOSIS — E6609 Other obesity due to excess calories: Secondary | ICD-10-CM | POA: Diagnosis not present

## 2018-08-05 DIAGNOSIS — M792 Neuralgia and neuritis, unspecified: Secondary | ICD-10-CM | POA: Diagnosis not present

## 2018-08-05 DIAGNOSIS — E0840 Diabetes mellitus due to underlying condition with diabetic neuropathy, unspecified: Secondary | ICD-10-CM | POA: Diagnosis not present

## 2018-08-05 DIAGNOSIS — M48062 Spinal stenosis, lumbar region with neurogenic claudication: Secondary | ICD-10-CM | POA: Diagnosis not present

## 2018-08-05 DIAGNOSIS — G8929 Other chronic pain: Secondary | ICD-10-CM | POA: Diagnosis not present

## 2018-08-05 DIAGNOSIS — Z79891 Long term (current) use of opiate analgesic: Secondary | ICD-10-CM | POA: Diagnosis not present

## 2018-08-05 DIAGNOSIS — M47897 Other spondylosis, lumbosacral region: Secondary | ICD-10-CM | POA: Diagnosis not present

## 2018-08-05 DIAGNOSIS — Z5181 Encounter for therapeutic drug level monitoring: Secondary | ICD-10-CM | POA: Diagnosis not present

## 2018-08-23 ENCOUNTER — Other Ambulatory Visit: Payer: Self-pay | Admitting: Family Medicine

## 2018-08-23 DIAGNOSIS — E1140 Type 2 diabetes mellitus with diabetic neuropathy, unspecified: Secondary | ICD-10-CM

## 2018-08-23 DIAGNOSIS — Z794 Long term (current) use of insulin: Principal | ICD-10-CM

## 2018-08-23 NOTE — Telephone Encounter (Signed)
L.O.V. 09/28/2017,please advise.

## 2018-09-03 DIAGNOSIS — Z5181 Encounter for therapeutic drug level monitoring: Secondary | ICD-10-CM | POA: Diagnosis not present

## 2018-09-03 DIAGNOSIS — M19012 Primary osteoarthritis, left shoulder: Secondary | ICD-10-CM | POA: Diagnosis not present

## 2018-09-03 DIAGNOSIS — Z79891 Long term (current) use of opiate analgesic: Secondary | ICD-10-CM | POA: Diagnosis not present

## 2018-09-03 DIAGNOSIS — E6609 Other obesity due to excess calories: Secondary | ICD-10-CM | POA: Diagnosis not present

## 2018-10-01 DIAGNOSIS — M19012 Primary osteoarthritis, left shoulder: Secondary | ICD-10-CM | POA: Diagnosis not present

## 2018-10-01 DIAGNOSIS — Z5181 Encounter for therapeutic drug level monitoring: Secondary | ICD-10-CM | POA: Diagnosis not present

## 2018-10-01 DIAGNOSIS — Z79891 Long term (current) use of opiate analgesic: Secondary | ICD-10-CM | POA: Diagnosis not present

## 2018-10-01 DIAGNOSIS — E6609 Other obesity due to excess calories: Secondary | ICD-10-CM | POA: Diagnosis not present

## 2018-10-29 DIAGNOSIS — M48062 Spinal stenosis, lumbar region with neurogenic claudication: Secondary | ICD-10-CM | POA: Diagnosis not present

## 2018-10-29 DIAGNOSIS — M47897 Other spondylosis, lumbosacral region: Secondary | ICD-10-CM | POA: Diagnosis not present

## 2018-10-29 DIAGNOSIS — M19012 Primary osteoarthritis, left shoulder: Secondary | ICD-10-CM | POA: Diagnosis not present

## 2018-10-29 DIAGNOSIS — Z79891 Long term (current) use of opiate analgesic: Secondary | ICD-10-CM | POA: Diagnosis not present

## 2018-10-29 DIAGNOSIS — M4807 Spinal stenosis, lumbosacral region: Secondary | ICD-10-CM | POA: Diagnosis not present

## 2018-10-29 DIAGNOSIS — M4726 Other spondylosis with radiculopathy, lumbar region: Secondary | ICD-10-CM | POA: Diagnosis not present

## 2018-10-29 DIAGNOSIS — M792 Neuralgia and neuritis, unspecified: Secondary | ICD-10-CM | POA: Diagnosis not present

## 2018-10-29 DIAGNOSIS — M25559 Pain in unspecified hip: Secondary | ICD-10-CM | POA: Diagnosis not present

## 2018-10-29 DIAGNOSIS — E6609 Other obesity due to excess calories: Secondary | ICD-10-CM | POA: Diagnosis not present

## 2018-10-29 DIAGNOSIS — Z5181 Encounter for therapeutic drug level monitoring: Secondary | ICD-10-CM | POA: Diagnosis not present

## 2018-10-29 DIAGNOSIS — G8929 Other chronic pain: Secondary | ICD-10-CM | POA: Diagnosis not present

## 2018-10-29 DIAGNOSIS — M79669 Pain in unspecified lower leg: Secondary | ICD-10-CM | POA: Diagnosis not present

## 2018-11-26 DIAGNOSIS — Z5181 Encounter for therapeutic drug level monitoring: Secondary | ICD-10-CM | POA: Diagnosis not present

## 2018-11-26 DIAGNOSIS — Z79891 Long term (current) use of opiate analgesic: Secondary | ICD-10-CM | POA: Diagnosis not present

## 2018-11-26 DIAGNOSIS — M19012 Primary osteoarthritis, left shoulder: Secondary | ICD-10-CM | POA: Diagnosis not present

## 2018-11-26 DIAGNOSIS — E6609 Other obesity due to excess calories: Secondary | ICD-10-CM | POA: Diagnosis not present

## 2018-12-05 ENCOUNTER — Emergency Department: Payer: Medicare Other

## 2018-12-05 ENCOUNTER — Other Ambulatory Visit: Payer: Self-pay

## 2018-12-05 ENCOUNTER — Emergency Department
Admission: EM | Admit: 2018-12-05 | Discharge: 2018-12-05 | Disposition: A | Discharge status: Home / Self Care | Payer: Medicare Other | Attending: Emergency Medicine | Admitting: Emergency Medicine

## 2018-12-05 ENCOUNTER — Encounter: Payer: Self-pay | Admitting: Emergency Medicine

## 2018-12-05 DIAGNOSIS — Y929 Unspecified place or not applicable: Secondary | ICD-10-CM | POA: Diagnosis not present

## 2018-12-05 DIAGNOSIS — Y999 Unspecified external cause status: Secondary | ICD-10-CM | POA: Insufficient documentation

## 2018-12-05 DIAGNOSIS — Z96642 Presence of left artificial hip joint: Secondary | ICD-10-CM | POA: Diagnosis not present

## 2018-12-05 DIAGNOSIS — F339 Major depressive disorder, recurrent, unspecified: Secondary | ICD-10-CM | POA: Diagnosis not present

## 2018-12-05 DIAGNOSIS — Y939 Activity, unspecified: Secondary | ICD-10-CM | POA: Diagnosis not present

## 2018-12-05 DIAGNOSIS — R0781 Pleurodynia: Secondary | ICD-10-CM

## 2018-12-05 DIAGNOSIS — S299XXA Unspecified injury of thorax, initial encounter: Secondary | ICD-10-CM | POA: Diagnosis present

## 2018-12-05 DIAGNOSIS — F900 Attention-deficit hyperactivity disorder, predominantly inattentive type: Secondary | ICD-10-CM | POA: Diagnosis not present

## 2018-12-05 DIAGNOSIS — F17210 Nicotine dependence, cigarettes, uncomplicated: Secondary | ICD-10-CM | POA: Diagnosis not present

## 2018-12-05 DIAGNOSIS — Z794 Long term (current) use of insulin: Secondary | ICD-10-CM | POA: Insufficient documentation

## 2018-12-05 DIAGNOSIS — I10 Essential (primary) hypertension: Secondary | ICD-10-CM | POA: Diagnosis not present

## 2018-12-05 DIAGNOSIS — S20212A Contusion of left front wall of thorax, initial encounter: Secondary | ICD-10-CM | POA: Insufficient documentation

## 2018-12-05 DIAGNOSIS — E119 Type 2 diabetes mellitus without complications: Secondary | ICD-10-CM | POA: Diagnosis not present

## 2018-12-05 LAB — URINALYSIS, COMPLETE (UACMP) WITH MICROSCOPIC
Bacteria, UA: NONE SEEN
Bilirubin Urine: NEGATIVE
Glucose, UA: 150 mg/dL — AB
Ketones, ur: NEGATIVE mg/dL
Nitrite: NEGATIVE
Protein, ur: 100 mg/dL — AB
Specific Gravity, Urine: 1.006 (ref 1.005–1.030)
pH: 7 (ref 5.0–8.0)

## 2018-12-05 MED ORDER — LIDOCAINE 5 % EX PTCH
1.0000 | MEDICATED_PATCH | CUTANEOUS | Status: DC
  Administered 2018-12-05: 1 via TRANSDERMAL
  Filled 2018-12-05: qty 1

## 2018-12-05 NOTE — Discharge Instructions (Addendum)
Please continue with oxycodone every 4-6 hours as needed for pain.  Please apply ice to the left ribs.  Continue with Lidoderm patch for 12 hours then transition to salonpas over-the-counter if relief is achieved.  Return to the ER immediately for any increasing pain, abdominal pain, chest pain, shortness of breath, nausea vomiting or fevers.
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
## 2018-12-24 DIAGNOSIS — Z79891 Long term (current) use of opiate analgesic: Secondary | ICD-10-CM | POA: Diagnosis not present

## 2018-12-24 DIAGNOSIS — M19012 Primary osteoarthritis, left shoulder: Secondary | ICD-10-CM | POA: Diagnosis not present

## 2018-12-24 DIAGNOSIS — Z5181 Encounter for therapeutic drug level monitoring: Secondary | ICD-10-CM | POA: Diagnosis not present

## 2018-12-24 DIAGNOSIS — E6609 Other obesity due to excess calories: Secondary | ICD-10-CM | POA: Diagnosis not present

## 2019-01-21 DIAGNOSIS — Z79891 Long term (current) use of opiate analgesic: Secondary | ICD-10-CM | POA: Diagnosis not present

## 2019-01-21 DIAGNOSIS — E6609 Other obesity due to excess calories: Secondary | ICD-10-CM | POA: Diagnosis not present

## 2019-01-21 DIAGNOSIS — M19012 Primary osteoarthritis, left shoulder: Secondary | ICD-10-CM | POA: Diagnosis not present

## 2019-01-21 DIAGNOSIS — Z5181 Encounter for therapeutic drug level monitoring: Secondary | ICD-10-CM | POA: Diagnosis not present

## 2019-02-18 DIAGNOSIS — M79669 Pain in unspecified lower leg: Secondary | ICD-10-CM | POA: Diagnosis not present

## 2019-02-18 DIAGNOSIS — M25559 Pain in unspecified hip: Secondary | ICD-10-CM | POA: Diagnosis not present

## 2019-02-18 DIAGNOSIS — Z79891 Long term (current) use of opiate analgesic: Secondary | ICD-10-CM | POA: Diagnosis not present

## 2019-02-18 DIAGNOSIS — Z5181 Encounter for therapeutic drug level monitoring: Secondary | ICD-10-CM | POA: Diagnosis not present

## 2019-02-18 DIAGNOSIS — M4807 Spinal stenosis, lumbosacral region: Secondary | ICD-10-CM | POA: Diagnosis not present

## 2019-02-18 DIAGNOSIS — G8929 Other chronic pain: Secondary | ICD-10-CM | POA: Diagnosis not present

## 2019-02-18 DIAGNOSIS — E6609 Other obesity due to excess calories: Secondary | ICD-10-CM | POA: Diagnosis not present

## 2019-02-18 DIAGNOSIS — M47897 Other spondylosis, lumbosacral region: Secondary | ICD-10-CM | POA: Diagnosis not present

## 2019-02-18 DIAGNOSIS — M792 Neuralgia and neuritis, unspecified: Secondary | ICD-10-CM | POA: Diagnosis not present

## 2019-02-18 DIAGNOSIS — M48062 Spinal stenosis, lumbar region with neurogenic claudication: Secondary | ICD-10-CM | POA: Diagnosis not present

## 2019-02-18 DIAGNOSIS — M4726 Other spondylosis with radiculopathy, lumbar region: Secondary | ICD-10-CM | POA: Diagnosis not present

## 2019-02-18 DIAGNOSIS — M19012 Primary osteoarthritis, left shoulder: Secondary | ICD-10-CM | POA: Diagnosis not present

## 2019-03-03 DIAGNOSIS — F900 Attention-deficit hyperactivity disorder, predominantly inattentive type: Secondary | ICD-10-CM | POA: Diagnosis not present

## 2019-03-03 DIAGNOSIS — F339 Major depressive disorder, recurrent, unspecified: Secondary | ICD-10-CM | POA: Diagnosis not present

## 2019-03-19 DIAGNOSIS — M545 Low back pain: Secondary | ICD-10-CM | POA: Diagnosis not present

## 2019-03-19 DIAGNOSIS — G894 Chronic pain syndrome: Secondary | ICD-10-CM | POA: Diagnosis not present

## 2019-03-19 DIAGNOSIS — M5136 Other intervertebral disc degeneration, lumbar region: Secondary | ICD-10-CM | POA: Diagnosis not present

## 2019-03-19 DIAGNOSIS — M19012 Primary osteoarthritis, left shoulder: Secondary | ICD-10-CM | POA: Diagnosis not present

## 2019-04-16 DIAGNOSIS — M545 Low back pain: Secondary | ICD-10-CM | POA: Diagnosis not present

## 2019-04-16 DIAGNOSIS — G894 Chronic pain syndrome: Secondary | ICD-10-CM | POA: Diagnosis not present

## 2019-04-16 DIAGNOSIS — M19012 Primary osteoarthritis, left shoulder: Secondary | ICD-10-CM | POA: Diagnosis not present

## 2019-04-16 DIAGNOSIS — M179 Osteoarthritis of knee, unspecified: Secondary | ICD-10-CM | POA: Diagnosis not present

## 2019-04-16 DIAGNOSIS — M5136 Other intervertebral disc degeneration, lumbar region: Secondary | ICD-10-CM | POA: Diagnosis not present

## 2019-04-25 ENCOUNTER — Telehealth: Payer: Self-pay | Admitting: Family Medicine

## 2019-04-25 NOTE — Telephone Encounter (Signed)
Almyra Free calling from pharmacy states that pt needs 1 ml insuline syringes to go along with lancets. Please advise

## 2019-04-28 MED ORDER — "INSULIN SYRINGE 27G X 1/2"" 1 ML MISC": 1.0000 [IU] | Freq: Every day | 3 refills | Status: DC

## 2019-04-28 NOTE — Telephone Encounter (Signed)
From [facility] 

## 2019-04-28 NOTE — Telephone Encounter (Signed)
Dr. Caryn Section, please advise. Patient is over due for Diabetes follow up. There is no active prescription for 73ml insulin syringe on patient's medication list. Her last office visit was 09/28/2017. I called patient and scheduled follow up appointment for 05/05/2019 at 3pm.

## 2019-05-05 ENCOUNTER — Other Ambulatory Visit: Payer: Self-pay

## 2019-05-05 ENCOUNTER — Encounter: Payer: Self-pay | Admitting: Family Medicine

## 2019-05-05 ENCOUNTER — Ambulatory Visit (INDEPENDENT_AMBULATORY_CARE_PROVIDER_SITE_OTHER): Payer: Medicare Other | Admitting: Family Medicine

## 2019-05-05 VITALS — BP 161/84 | HR 89 | Temp 97.1°F | Wt 221.0 lb

## 2019-05-05 DIAGNOSIS — E1140 Type 2 diabetes mellitus with diabetic neuropathy, unspecified: Secondary | ICD-10-CM | POA: Diagnosis not present

## 2019-05-05 DIAGNOSIS — R809 Proteinuria, unspecified: Secondary | ICD-10-CM | POA: Diagnosis not present

## 2019-05-05 DIAGNOSIS — Z720 Tobacco use: Secondary | ICD-10-CM | POA: Diagnosis not present

## 2019-05-05 DIAGNOSIS — K047 Periapical abscess without sinus: Secondary | ICD-10-CM

## 2019-05-05 DIAGNOSIS — R05 Cough: Secondary | ICD-10-CM

## 2019-05-05 DIAGNOSIS — I10 Essential (primary) hypertension: Secondary | ICD-10-CM

## 2019-05-05 DIAGNOSIS — Z794 Long term (current) use of insulin: Secondary | ICD-10-CM

## 2019-05-05 DIAGNOSIS — Z23 Encounter for immunization: Secondary | ICD-10-CM | POA: Diagnosis not present

## 2019-05-05 DIAGNOSIS — R059 Cough, unspecified: Secondary | ICD-10-CM

## 2019-05-05 MED ORDER — AMOXICILLIN 500 MG PO CAPS: 1000.0000 mg | ORAL_CAPSULE | Freq: Two times a day (BID) | ORAL | 0 refills | Status: AC

## 2019-05-05 NOTE — Progress Notes (Signed)
[name] Female    DOB: 02/02/1961   58 y.o.   MRN: [id number] Visit Date: 05/05/2019  Today's Provider: Lelon Huh, MD   Chief Complaint  Patient presents with  . Diabetes   Subjective:     HPI  Diabetes Mellitus Type II, Follow-up:   Recent Labs       Lab Results  Component Value Date   HGBA1C 7.7 11/02/2016   HGBA1C 7.6 04/06/2015   HGBA1C 6.7 (A) 01/28/2014     Last seen for diabetes 09/28/2017 Management since then includes counseled importance of monitoring blood sugar due to risk of hypoglycemia on high doses of insulin. Added Actos 15 mg She reports fair compliance with treatment. She is not having side effects. none Current symptoms include none  Home blood sugar records: fasting range: not checking at home  Episodes of hypoglycemia? no              Current Insulin Regimen: Lantus 100 units every morning. Patient states she is supposed to take 100 units BID because she was feeling hypoglycemic Most Recent Eye Exam: none Weight trend: stable Prior visit with dietician: no Current diet: in general, an "unhealthy" diet Current exercise: none  ----------------------------------------------------------------  She also complains of pain and swelling around gum similar to prior dental infections. She has also had mildly productive cough for the last several days.    Allergies  Allergen Reactions  . Meloxicam Itching, Swelling and Nausea And Vomiting  . Sulfa Antibiotics Other (See Comments)     Current Outpatient Medications:  .  amphetamine-dextroamphetamine (ADDERALL) 20 MG tablet, , Disp: , Rfl:  .  aspirin 325 MG tablet, Take 325 mg by mouth daily. Reported on 07/30/2015, Disp: , Rfl:  .  Blood Glucose Monitoring Suppl (ONE TOUCH ULTRA MINI) w/Device KIT, Use to check blood sugar once a day, Dm2 E11.65, Disp: 1 each, Rfl: 0 .  gabapentin (NEURONTIN) 300 MG capsule, , Disp: , Rfl:  .  insulin aspart (NOVOLOG) 100 UNIT/ML  FlexPen, Inject before meals anywhere from 0 to 20 units, three times daily due to sliding scale., Disp: 15 mL, Rfl: 11 .  Insulin Syringe 27G X 1/2" 1 ML MISC, 1 Units by Does not apply route daily. For insulin dependent type 2 diabetes, Disp: 100 each, Rfl: 3 .  LANTUS 100 UNIT/ML injection, INJECT 100 UNITS SUBCUTANEOUSLY TWICE DAILY, Disp: 4500 mL, Rfl: 2 .  methadone (DOLOPHINE) 10 MG tablet, Limit 5-7 tabs by mouth per day if tolerated (Patient taking differently: Take 10 mg by mouth. Limit 5-7 tabs by mouth per day if tolerated), Disp: 210 tablet, Rfl: 0 .  methocarbamol (ROBAXIN) 500 MG tablet, Take 1 tablet (500 mg total) by mouth every 8 (eight) hours as needed., Disp: 12 tablet, Rfl: 0 .  NARCAN 4 MG/0.1ML LIQD nasal spray kit, , Disp: , Rfl:  .  oxyCODONE (OXY IR/ROXICODONE) 5 MG immediate release tablet, Limit 4 - 6 tabs po / day for breakthrough pain while taking methadone if tolerated, Disp: 180 tablet, Rfl: 0 .  pioglitazone (ACTOS) 15 MG tablet, Take 1 tablet (15 mg total) by mouth daily. (Patient not taking: Reported on 05/05/2019), Disp: 30 tablet, Rfl: 1  Review of Systems  Constitutional: Negative for appetite change, chills, fatigue and fever.  Respiratory: Negative for chest tightness and shortness of breath.   Cardiovascular: Negative for chest pain and palpitations.  Gastrointestinal: Negative for abdominal pain, nausea and vomiting.  Neurological:  Negative for dizziness and weakness.    Social History   Tobacco Use  . Smoking status: Current Every Day Smoker    Packs/day: 1.00    Years: 44.00    Pack years: 44.00    Types: Cigarettes  . Smokeless tobacco: Current User    Types: Chew  Substance Use Topics  . Alcohol use: No    Alcohol/week: 0.0 standard drinks      Objective:   BP (!) 161/84 (BP Location: Right Arm, Patient Position: Sitting, Cuff Size: Large)   Pulse 89   Temp (!) 97.1 F (36.2 C) (Temporal)   Wt 221 lb (100.2 kg)   SpO2 94%   BMI  35.67 kg/m  Vitals:   05/05/19 1518  BP: (!) 161/84  Pulse: 89  Temp: (!) 97.1 F (36.2 C)  TempSrc: Temporal  SpO2: 94%  Weight: 221 lb (100.2 kg)  Body mass index is 35.67 kg/m.   Physical Exam   General Appearance:    Obese female in no acute distress  Eyes:    PERRL, conjunctiva/corneas clear, EOM's intact       Lungs:     Clear to auscultation bilaterally, respirations unlabored  Heart:    Normal heart rate. Normal rhythm. No murmurs, rubs, or gallops.   MS:   All extremities are intact.   Neurologic:   Awake, alert, oriented x 3. No apparent focal neurological           defect.          Assessment & Plan    1. Type 2 diabetes mellitus with diabetic neuropathy, with long-term current use of insulin (HCC)  - Comprehensive metabolic panel - HgB V7M - Lipid Profile  2. Dental infection  - amoxicillin (AMOXIL) 500 MG capsule; Take 2 capsules (1,000 mg total) by mouth 2 (two) times daily for 10 days.  Dispense: 40 capsule; Refill: 0  3. Cough   4. Current tobacco use She did not tolerate Chantix, and failed trial of bupropion in the past  5. Essential hypertension Consider adding ACEI or SGLT-1 inhibitor after reviewing labs.  - TSH  6. Need for influenza vaccination  - Flu Vaccine QUAD 6+ mos PF IM (Fluarix Quad PF)  The entirety of the information documented in the History of Present Illness, Review of Systems and Physical Exam were personally obtained by me. Portions of this information were initially documented by Idelle Jo, CMA and reviewed by me for thoroughness and accuracy.     Lelon Huh, MD  [facility]

## 2019-05-06 ENCOUNTER — Telehealth: Payer: Self-pay

## 2019-05-06 ENCOUNTER — Telehealth: Payer: Self-pay | Admitting: Family Medicine

## 2019-05-06 ENCOUNTER — Other Ambulatory Visit: Payer: Self-pay | Admitting: Family Medicine

## 2019-05-06 LAB — COMPREHENSIVE METABOLIC PANEL
ALT: 8 IU/L (ref 0–32)
AST: 11 IU/L (ref 0–40)
Albumin/Globulin Ratio: 1.2 (ref 1.2–2.2)
Albumin: 3.7 g/dL — ABNORMAL LOW (ref 3.8–4.9)
Alkaline Phosphatase: 73 IU/L (ref 39–117)
BUN/Creatinine Ratio: 19 (ref 12–28)
BUN: 14 mg/dL (ref 8–27)
Bilirubin Total: 0.3 mg/dL (ref 0.0–1.2)
CO2: 27 mmol/L (ref 20–29)
Calcium: 9 mg/dL (ref 8.7–10.3)
Chloride: 97 mmol/L (ref 96–106)
Creatinine, Ser: 0.75 mg/dL (ref 0.57–1.00)
GFR calc Af Amer: 100 mL/min/{1.73_m2} (ref >59)
GFR calc non Af Amer: 87 mL/min/{1.73_m2} (ref >59)
Globulin, Total: 3.2 g/dL (ref 1.5–4.5)
Glucose: 226 mg/dL — ABNORMAL HIGH (ref 65–99)
Potassium: 4.4 mmol/L (ref 3.5–5.2)
Sodium: 139 mmol/L (ref 134–144)
Total Protein: 6.9 g/dL (ref 6.0–8.5)

## 2019-05-06 LAB — LIPID PANEL
Chol/HDL Ratio: 4.7 ratio — ABNORMAL HIGH (ref 0.0–4.4)
Cholesterol, Total: 160 mg/dL (ref 100–199)
HDL: 34 mg/dL — ABNORMAL LOW (ref >39)
LDL Chol Calc (NIH): 81 mg/dL (ref 0–99)
Triglycerides: 276 mg/dL — ABNORMAL HIGH (ref 0–149)
VLDL Cholesterol Cal: 45 mg/dL — ABNORMAL HIGH (ref 5–40)

## 2019-05-06 LAB — HEMOGLOBIN A1C
Est. average glucose Bld gHb Est-mCnc: 203 mg/dL
Hgb A1c MFr Bld: 8.7 % — ABNORMAL HIGH (ref 4.8–5.6)

## 2019-05-06 LAB — TSH: TSH: 1.77 u[IU]/mL (ref 0.450–4.500)

## 2019-05-06 MED ORDER — "INSULIN SYRINGE 27G X 1/2"" 1 ML MISC": 1.0000 [IU] | Freq: Every day | 3 refills | Status: DC

## 2019-05-06 NOTE — Telephone Encounter (Signed)
-----   Message from Birdie Sons, MD sent at 05/06/2019  8:18 AM EST ----- Triglycerides are much too high. Need to start rosuvastatin 10mg  once a day, #30, rf x 3.  A1c is high at 8.7. need to start Invokana 100mg  once a day #30, rf x 3. This will help blood sugar and blood pressure.  Need to schedule follow up in 2 months.

## 2019-05-06 NOTE — Telephone Encounter (Signed)
Attempted to contact patient, no answer or voicemail. Okay for PEC to advise patient.  

## 2019-05-06 NOTE — Telephone Encounter (Signed)
Pharmacist needs clarification on instructions for insulin syringe. The current sig reads: 1 Units by Does not apply route daily.   Pharmacist needs to know how many she will be using daily.

## 2019-05-06 NOTE — Telephone Encounter (Signed)
Yes, I know, the computer makes [place] do that.  It should just say  One insulin injection twice daily

## 2019-05-06 NOTE — Telephone Encounter (Signed)
Medication Refill - Medication: Insulin Syringe 27G X 1/2" 1 ML MISC  Has the patient contacted their pharmacy? Yes.   (Agent: If no, request that the patient contact the pharmacy for the refill.) (Agent: If yes, when and what did the pharmacy advise?)  Preferred Pharmacy (with phone number or street name): [place], [place]  Agent: Please be advised that RX refills may take up to 3 business days. We ask that you follow-up with your pharmacy.

## 2019-05-06 NOTE — Telephone Encounter (Signed)
Renee with [facility] calling to request clarification on Insulin Syringe 27G X 1/2" 1 ML MISC

## 2019-05-06 NOTE — Telephone Encounter (Signed)
From [facility] 

## 2019-05-07 NOTE — Telephone Encounter (Signed)
Pharmacist Renee advised as below.

## 2019-05-12 NOTE — Telephone Encounter (Signed)
Attempted to contact patient but answering service said my call could not be completed at this time. If patient returns missed call okay for PEC to advise. KW

## 2019-05-14 DIAGNOSIS — M19012 Primary osteoarthritis, left shoulder: Secondary | ICD-10-CM | POA: Diagnosis not present

## 2019-05-14 DIAGNOSIS — G894 Chronic pain syndrome: Secondary | ICD-10-CM | POA: Diagnosis not present

## 2019-05-14 DIAGNOSIS — M5136 Other intervertebral disc degeneration, lumbar region: Secondary | ICD-10-CM | POA: Diagnosis not present

## 2019-05-14 DIAGNOSIS — M545 Low back pain: Secondary | ICD-10-CM | POA: Diagnosis not present

## 2019-05-19 NOTE — Telephone Encounter (Signed)
Phone # in chart is invalid. Letter mailed to address in chart.

## 2019-05-25 ENCOUNTER — Other Ambulatory Visit: Payer: Self-pay | Admitting: Family Medicine

## 2019-05-25 MED ORDER — CANAGLIFLOZIN 100 MG PO TABS: 100.0000 mg | ORAL_TABLET | Freq: Every day | ORAL | 1 refills | Status: DC

## 2019-05-25 NOTE — Progress Notes (Signed)
Prescription invokana, see results note 05-04-2020

## 2019-06-11 DIAGNOSIS — M5136 Other intervertebral disc degeneration, lumbar region: Secondary | ICD-10-CM | POA: Diagnosis not present

## 2019-06-11 DIAGNOSIS — E1140 Type 2 diabetes mellitus with diabetic neuropathy, unspecified: Secondary | ICD-10-CM | POA: Diagnosis not present

## 2019-06-11 DIAGNOSIS — G8929 Other chronic pain: Secondary | ICD-10-CM | POA: Diagnosis not present

## 2019-06-11 DIAGNOSIS — M19012 Primary osteoarthritis, left shoulder: Secondary | ICD-10-CM | POA: Diagnosis not present

## 2019-06-11 DIAGNOSIS — M48062 Spinal stenosis, lumbar region with neurogenic claudication: Secondary | ICD-10-CM | POA: Diagnosis not present

## 2019-06-11 DIAGNOSIS — M25559 Pain in unspecified hip: Secondary | ICD-10-CM | POA: Diagnosis not present

## 2019-06-11 DIAGNOSIS — E669 Obesity, unspecified: Secondary | ICD-10-CM | POA: Diagnosis not present

## 2019-06-11 DIAGNOSIS — G894 Chronic pain syndrome: Secondary | ICD-10-CM | POA: Diagnosis not present

## 2019-06-11 DIAGNOSIS — M47897 Other spondylosis, lumbosacral region: Secondary | ICD-10-CM | POA: Diagnosis not present

## 2019-06-11 DIAGNOSIS — M545 Low back pain: Secondary | ICD-10-CM | POA: Diagnosis not present

## 2019-06-13 ENCOUNTER — Ambulatory Visit: Payer: Medicare Other | Admitting: Family Medicine

## 2019-07-09 DIAGNOSIS — M179 Osteoarthritis of knee, unspecified: Secondary | ICD-10-CM | POA: Diagnosis not present

## 2019-07-09 DIAGNOSIS — M545 Low back pain: Secondary | ICD-10-CM | POA: Diagnosis not present

## 2019-07-09 DIAGNOSIS — G894 Chronic pain syndrome: Secondary | ICD-10-CM | POA: Diagnosis not present

## 2019-07-09 DIAGNOSIS — M5136 Other intervertebral disc degeneration, lumbar region: Secondary | ICD-10-CM | POA: Diagnosis not present

## 2019-07-09 DIAGNOSIS — M19012 Primary osteoarthritis, left shoulder: Secondary | ICD-10-CM | POA: Diagnosis not present

## 2019-08-06 ENCOUNTER — Other Ambulatory Visit: Payer: Self-pay | Admitting: Family Medicine

## 2019-08-06 DIAGNOSIS — M5136 Other intervertebral disc degeneration, lumbar region: Secondary | ICD-10-CM | POA: Diagnosis not present

## 2019-08-06 DIAGNOSIS — G894 Chronic pain syndrome: Secondary | ICD-10-CM | POA: Diagnosis not present

## 2019-08-06 DIAGNOSIS — M545 Low back pain: Secondary | ICD-10-CM | POA: Diagnosis not present

## 2019-08-06 DIAGNOSIS — Z794 Long term (current) use of insulin: Secondary | ICD-10-CM

## 2019-08-06 DIAGNOSIS — E1140 Type 2 diabetes mellitus with diabetic neuropathy, unspecified: Secondary | ICD-10-CM

## 2019-08-06 DIAGNOSIS — M179 Osteoarthritis of knee, unspecified: Secondary | ICD-10-CM | POA: Diagnosis not present

## 2019-08-06 NOTE — Telephone Encounter (Signed)
Requested Prescriptions  Pending Prescriptions Disp Refills  . NOVOLOG FLEXPEN 100 UNIT/ML FlexPen [Pharmacy Med Name: Novolog Flexpen U-100 Insulin aspart 100 unit/mL (3 mL) subcutaneous] 15 mL 11    Sig: INJECT 0-20 UNITS 3 TIMES A DAY BEFORE MEALS PER SLIDING SCALE PER MD     Endocrinology:  Diabetes - Insulins Failed - 08/06/2019  1:41 PM      Failed - HBA1C is between 0 and 7.9 and within 180 days    Hgb A1c MFr Bld  Date Value Ref Range Status  05/05/2019 8.7 (H) 4.8 - 5.6 % Final    Comment:             Prediabetes: 5.7 - 6.4          Diabetes: >6.4          Glycemic control for adults with diabetes: <7.0          Passed - Valid encounter within last 6 months    Recent Outpatient Visits          3 months ago Type 2 diabetes mellitus with diabetic neuropathy, with long-term current use of insulin ([place])   [facility] Birdie Sons, MD   1 year ago Type 2 diabetes mellitus with diabetic neuropathy, with long-term current use of insulin ([place])   [facility] Birdie Sons, MD   2 years ago Type 2 diabetes mellitus with diabetic neuropathy, with long-term current use of insulin [facility])   [facility] Birdie Sons, MD   3 years ago Dental infection   [facility] Birdie Sons, MD   3 years ago Urinary tract infection with hematuria, site unspecified   [place], [place], [place]

## 2019-09-03 ENCOUNTER — Telehealth: Payer: Self-pay | Admitting: Family Medicine

## 2019-09-03 DIAGNOSIS — E1140 Type 2 diabetes mellitus with diabetic neuropathy, unspecified: Secondary | ICD-10-CM

## 2019-09-03 MED ORDER — INSULIN GLARGINE 100 UNIT/ML ~~LOC~~ SOLN: SUBCUTANEOUS | 2 refills | Status: DC

## 2019-09-03 NOTE — Telephone Encounter (Signed)
Requested medication (s) are due for refill today: Yes  Requested medication (s) are on the active medication list: Yes  Last refill:  08/25/18  Future visit scheduled: No  Notes to clinic:  Prescription has expired.    Requested Prescriptions  Pending Prescriptions Disp Refills   insulin glargine (LANTUS) 100 UNIT/ML injection 4500 mL 2    Sig: INJECT 100 UNITS SUBCUTANEOUSLY TWICE DAILY      Endocrinology:  Diabetes - Insulins Failed - 09/03/2019 11:52 AM      Failed - HBA1C is between 0 and 7.9 and within 180 days    Hgb A1c MFr Bld  Date Value Ref Range Status  05/05/2019 8.7 (H) 4.8 - 5.6 % Final    Comment:             Prediabetes: 5.7 - 6.4          Diabetes: >6.4          Glycemic control for adults with diabetes: <7.0           Passed - Valid encounter within last 6 months    Recent Outpatient Visits           4 months ago Type 2 diabetes mellitus with diabetic neuropathy, with long-term current use of insulin ([place])   [facility] Birdie Sons, MD   1 year ago Type 2 diabetes mellitus with diabetic neuropathy, with long-term current use of insulin ([place])   [facility] Birdie Sons, MD   2 years ago Type 2 diabetes mellitus with diabetic neuropathy, with long-term current use of insulin [facility])   [facility] Birdie Sons, MD   3 years ago Dental infection   [facility] Birdie Sons, MD   3 years ago Urinary tract infection with hematuria, site unspecified   [place], [place], [place]

## 2019-09-03 NOTE — Telephone Encounter (Signed)
Medication Refill - Medication: lantus  Has the patient contacted their pharmacy? Yes.   Pharmacy called stating that the pt is completely out of this medication. Please advise.  (Agent: If no, request that the patient contact the pharmacy for the refill.) (Agent: If yes, when and what did the pharmacy advise?)  Preferred Pharmacy (with phone number or street name):  [place], [place]  [place] [address]  Phone: [phone] Fax: [phone]  Not a 24 hour pharmacy; exact hours not known.     Agent: Please be advised that RX refills may take up to 3 business days. We ask that you follow-up with your pharmacy.

## 2019-09-04 NOTE — Telephone Encounter (Signed)
This patient is overdue for follow up office visit due to uncontrolled diabetes. Refill for lantus was sent to pharmacy yesterday. But she needs to schedule follow up office visit. Within the next month.

## 2019-09-04 NOTE — Telephone Encounter (Signed)
Tried calling patient. Phone number in chart is invalid. I called pharmacy and advised them to make an note on her prescription that she needs to schedule an office visit. Pharmacist agreed to add note. Pharmacist states that patient has not picked up the medication yet, and they always have a hard time getting in contact with patient.

## 2019-09-08 DIAGNOSIS — G894 Chronic pain syndrome: Secondary | ICD-10-CM | POA: Diagnosis not present

## 2019-09-08 DIAGNOSIS — M545 Low back pain: Secondary | ICD-10-CM | POA: Diagnosis not present

## 2019-09-08 DIAGNOSIS — M5136 Other intervertebral disc degeneration, lumbar region: Secondary | ICD-10-CM | POA: Diagnosis not present

## 2019-09-08 DIAGNOSIS — M19012 Primary osteoarthritis, left shoulder: Secondary | ICD-10-CM | POA: Diagnosis not present

## 2019-09-11 ENCOUNTER — Ambulatory Visit: Payer: Self-pay | Admitting: Family Medicine

## 2019-09-11 NOTE — Telephone Encounter (Signed)
Pt reports tripped and fell 2 weeks ago, right arm pain since incident. States constant 6/10 shoulder area and underarm at rib area. Reports swelling initially, no bruising or lacerations.  States since fall also with increased fatigue, generalized weakness, nausea, no appetite, "Dry heaves" Reports mild SOB "But I'm a smoker". Denies increased pain with inspiration, denies CP. Also reports 3 days ago BS 408, states had been "Running good, only that one day". Pt verbalizes understanding with covid screening can not be seen at practice. Pt directed to UC, states will follow disposition.Care advice given per protocol. Pt verbalizes understanding.  Reason for Disposition . [1] SEVERE pain AND [2] not improved 2 hours after pain medicine    Multiple issues s/p fall  Answer Assessment - Initial Assessment Questions 1. ONSET: "When did the pain start?"     2 weeks ago s/p fall 2. LOCATION: "Where is the pain located?"    Right shoulder area, under arm 3. PAIN: "How bad is the pain?" (Scale 1-10; or mild, moderate, severe)   - MILD (1-3): doesn't interfere with normal activities   - MODERATE (4-7): interferes with normal activities (e.g., work or school) or awakens from sleep   - SEVERE (8-10): excruciating pain, unable to do any normal activities, unable to hold a cup of water    6/10 4. WORK OR EXERCISE: "Has there been any recent work or exercise that involved this part of the body?"     fall 5. CAUSE: "What do you think is causing the arm pain?"    fall 6. OTHER SYMPTOMS: "Do you have any other symptoms?" (e.g., neck pain, swelling, rash, fever, numbness, weakness)     Swelling initially, weakness, fatigue, nausea,"Dy heaves" cough  Protocols used: ARM PAIN-A-AH

## 2019-10-06 DIAGNOSIS — Z79891 Long term (current) use of opiate analgesic: Secondary | ICD-10-CM | POA: Diagnosis not present

## 2019-10-06 DIAGNOSIS — G894 Chronic pain syndrome: Secondary | ICD-10-CM | POA: Diagnosis not present

## 2019-10-06 DIAGNOSIS — M5136 Other intervertebral disc degeneration, lumbar region: Secondary | ICD-10-CM | POA: Diagnosis not present

## 2019-10-06 DIAGNOSIS — M259 Joint disorder, unspecified: Secondary | ICD-10-CM | POA: Diagnosis not present

## 2019-10-15 DIAGNOSIS — F339 Major depressive disorder, recurrent, unspecified: Secondary | ICD-10-CM | POA: Diagnosis not present

## 2019-10-15 DIAGNOSIS — F900 Attention-deficit hyperactivity disorder, predominantly inattentive type: Secondary | ICD-10-CM | POA: Diagnosis not present

## 2019-11-11 ENCOUNTER — Telehealth: Payer: Self-pay | Admitting: Family Medicine

## 2019-11-11 DIAGNOSIS — Z79891 Long term (current) use of opiate analgesic: Secondary | ICD-10-CM | POA: Diagnosis not present

## 2019-11-11 DIAGNOSIS — G894 Chronic pain syndrome: Secondary | ICD-10-CM | POA: Diagnosis not present

## 2019-11-11 DIAGNOSIS — M259 Joint disorder, unspecified: Secondary | ICD-10-CM | POA: Diagnosis not present

## 2019-11-11 DIAGNOSIS — M5136 Other intervertebral disc degeneration, lumbar region: Secondary | ICD-10-CM | POA: Diagnosis not present

## 2019-11-11 NOTE — Chronic Care Management (AMB) (Signed)
  Chronic Care Management   Outreach Note  11/11/2019 Name: Amy Meyers MRN: [id number] DOB: 06/21/1960  Amy Meyers is a 59 y.o. year old female who is a primary care patient of Fisher, Kirstie Peri, MD. I reached out to Amy Meyers by phone today in response to a referral sent by Ms. Billey [facility]'s health plan.     An unsuccessful telephone outreach was attempted today, pt has a non working number on file. The patient was referred to the case management team for assistance with care management and care coordination.   Follow Up Plan: The care management team is available to follow up with the patient after provider conversation with the patient regarding recommendation for care management engagement and subsequent re-referral to the care management team.   Noreene Larsson, [place], [place], [place] [address] Direct Dial: [phone] [email or web address] Website: [email or web address]

## 2019-12-05 ENCOUNTER — Other Ambulatory Visit: Payer: Self-pay | Admitting: Family Medicine

## 2019-12-05 DIAGNOSIS — Z794 Long term (current) use of insulin: Secondary | ICD-10-CM

## 2019-12-05 NOTE — Telephone Encounter (Signed)
Pt's daughter called, requesting to have this submitted before office closes for the weekend. Please advise

## 2019-12-05 NOTE — Telephone Encounter (Signed)
Requested medication (s) are due for refill today: yes  Requested medication (s) are on the active medication list: yes  Last refill:  09/03/2019  Future visit scheduled: no  Notes to clinic:  overdue for follow up Attempted to contact patient by phone and number is not working   Requested Prescriptions  Pending Prescriptions Disp Refills   insulin glargine (LANTUS) 100 UNIT/ML injection 4500 mL 2    Sig: INJECT 100 UNITS SUBCUTANEOUSLY TWICE DAILY      Endocrinology:  Diabetes - Insulins Failed - 12/05/2019  9:15 AM      Failed - HBA1C is between 0 and 7.9 and within 180 days    Hgb A1c MFr Bld  Date Value Ref Range Status  05/05/2019 8.7 (H) 4.8 - 5.6 % Final    Comment:             Prediabetes: 5.7 - 6.4          Diabetes: >6.4          Glycemic control for adults with diabetes: <7.0           Failed - Valid encounter within last 6 months    Recent Outpatient Visits           7 months ago Type 2 diabetes mellitus with diabetic neuropathy, with long-term current use of insulin ([place])   [facility] Birdie Sons, MD   2 years ago Type 2 diabetes mellitus with diabetic neuropathy, with long-term current use of insulin ([place])   [facility] Birdie Sons, MD   3 years ago Type 2 diabetes mellitus with diabetic neuropathy, with long-term current use of insulin [facility])   [facility] Birdie Sons, MD   3 years ago Dental infection   [facility] Birdie Sons, MD   3 years ago Urinary tract infection with hematuria, site unspecified   [place], [place], [place]

## 2019-12-05 NOTE — Telephone Encounter (Signed)
Please review. Pt is overdue for an office visit.

## 2019-12-05 NOTE — Telephone Encounter (Signed)
Medication Refill - Medication: insulin glargine (LANTUS) 100 UNIT/ML injection   Has the patient contacted their pharmacy? Yes.   (Agent: If no, request that the patient contact the pharmacy for the refill.) (Agent: If yes, when and what did the pharmacy advise?)  Preferred Pharmacy (with phone number or street name): [place] [facility]   Agent: Please be advised that RX refills may take up to 3 business days. We ask that you follow-up with your pharmacy.

## 2019-12-06 ENCOUNTER — Other Ambulatory Visit: Payer: Self-pay | Admitting: Family Medicine

## 2019-12-06 DIAGNOSIS — E1140 Type 2 diabetes mellitus with diabetic neuropathy, unspecified: Secondary | ICD-10-CM

## 2019-12-06 NOTE — Telephone Encounter (Signed)
Requested medication (s) are due for refill today: yes  Requested medication (s) are on the active medication list: yes  Last refill:  09/03/19  Future visit scheduled: no  Notes to clinic:  per note from pharmacy: " This medication was pended with free text dispense values - stated "...will have to enter dispense values to sign the order."   Requested Prescriptions  Pending Prescriptions Disp Refills   LANTUS 100 UNIT/ML injection [Pharmacy Med Name: Lantus U-100 Insulin 100 unit/mL subcutaneous solution] 4,500 mL 0    Sig: INJECT [place] A DAY      Endocrinology:  Diabetes - Insulins Failed - 12/06/2019 10:42 AM      Failed - HBA1C is between 0 and 7.9 and within 180 days    Hgb A1c MFr Bld  Date Value Ref Range Status  05/05/2019 8.7 (H) 4.8 - 5.6 % Final    Comment:             Prediabetes: 5.7 - 6.4          Diabetes: >6.4          Glycemic control for adults with diabetes: <7.0           Failed - Valid encounter within last 6 months    Recent Outpatient Visits           7 months ago Type 2 diabetes mellitus with diabetic neuropathy, with long-term current use of insulin ([place])   [facility] Birdie Sons, MD   2 years ago Type 2 diabetes mellitus with diabetic neuropathy, with long-term current use of insulin ([place])   [facility] Birdie Sons, MD   3 years ago Type 2 diabetes mellitus with diabetic neuropathy, with long-term current use of insulin [facility])   [facility] Birdie Sons, MD   3 years ago Dental infection   [facility] Birdie Sons, MD   3 years ago Urinary tract infection with hematuria, site unspecified   [place], [place], [place]

## 2019-12-09 DIAGNOSIS — M79669 Pain in unspecified lower leg: Secondary | ICD-10-CM | POA: Diagnosis not present

## 2019-12-09 DIAGNOSIS — M25559 Pain in unspecified hip: Secondary | ICD-10-CM | POA: Diagnosis not present

## 2019-12-09 DIAGNOSIS — G8929 Other chronic pain: Secondary | ICD-10-CM | POA: Diagnosis not present

## 2019-12-09 DIAGNOSIS — M5136 Other intervertebral disc degeneration, lumbar region: Secondary | ICD-10-CM | POA: Diagnosis not present

## 2019-12-09 DIAGNOSIS — M48062 Spinal stenosis, lumbar region with neurogenic claudication: Secondary | ICD-10-CM | POA: Diagnosis not present

## 2019-12-09 DIAGNOSIS — E1140 Type 2 diabetes mellitus with diabetic neuropathy, unspecified: Secondary | ICD-10-CM | POA: Diagnosis not present

## 2019-12-09 DIAGNOSIS — M47897 Other spondylosis, lumbosacral region: Secondary | ICD-10-CM | POA: Diagnosis not present

## 2019-12-09 DIAGNOSIS — M179 Osteoarthritis of knee, unspecified: Secondary | ICD-10-CM | POA: Diagnosis not present

## 2019-12-09 DIAGNOSIS — M792 Neuralgia and neuritis, unspecified: Secondary | ICD-10-CM | POA: Diagnosis not present

## 2019-12-09 DIAGNOSIS — G894 Chronic pain syndrome: Secondary | ICD-10-CM | POA: Diagnosis not present

## 2019-12-09 DIAGNOSIS — E669 Obesity, unspecified: Secondary | ICD-10-CM | POA: Diagnosis not present

## 2019-12-09 MED ORDER — INSULIN GLARGINE 100 UNIT/ML ~~LOC~~ SOLN: SUBCUTANEOUS | 0 refills | Status: DC

## 2019-12-09 NOTE — Telephone Encounter (Signed)
Attempted to contact patient to schedule a follow up appointment. No answer or voicemail.

## 2019-12-14 ENCOUNTER — Other Ambulatory Visit: Payer: Self-pay | Admitting: Family Medicine

## 2019-12-14 DIAGNOSIS — Z794 Long term (current) use of insulin: Secondary | ICD-10-CM

## 2020-01-06 DIAGNOSIS — G894 Chronic pain syndrome: Secondary | ICD-10-CM | POA: Diagnosis not present

## 2020-01-06 DIAGNOSIS — M5136 Other intervertebral disc degeneration, lumbar region: Secondary | ICD-10-CM | POA: Diagnosis not present

## 2020-01-06 DIAGNOSIS — M259 Joint disorder, unspecified: Secondary | ICD-10-CM | POA: Diagnosis not present

## 2020-01-06 DIAGNOSIS — Z79891 Long term (current) use of opiate analgesic: Secondary | ICD-10-CM | POA: Diagnosis not present

## 2020-02-09 ENCOUNTER — Other Ambulatory Visit: Payer: Self-pay | Admitting: Family Medicine

## 2020-02-09 DIAGNOSIS — Z794 Long term (current) use of insulin: Secondary | ICD-10-CM

## 2020-02-10 DIAGNOSIS — F339 Major depressive disorder, recurrent, unspecified: Secondary | ICD-10-CM | POA: Diagnosis not present

## 2020-02-10 DIAGNOSIS — F900 Attention-deficit hyperactivity disorder, predominantly inattentive type: Secondary | ICD-10-CM | POA: Diagnosis not present

## 2020-02-11 DIAGNOSIS — M545 Low back pain: Secondary | ICD-10-CM | POA: Diagnosis not present

## 2020-02-11 DIAGNOSIS — Z79891 Long term (current) use of opiate analgesic: Secondary | ICD-10-CM | POA: Diagnosis not present

## 2020-02-11 DIAGNOSIS — M5136 Other intervertebral disc degeneration, lumbar region: Secondary | ICD-10-CM | POA: Diagnosis not present

## 2020-02-11 DIAGNOSIS — M179 Osteoarthritis of knee, unspecified: Secondary | ICD-10-CM | POA: Diagnosis not present

## 2020-02-29 ENCOUNTER — Other Ambulatory Visit: Payer: Self-pay

## 2020-02-29 ENCOUNTER — Emergency Department: Payer: Medicare Other

## 2020-02-29 ENCOUNTER — Emergency Department
Admission: EM | Admit: 2020-02-29 | Discharge: 2020-02-29 | Disposition: A | Discharge status: Home / Self Care | Payer: Medicare Other | Attending: Emergency Medicine | Admitting: Emergency Medicine

## 2020-02-29 DIAGNOSIS — R109 Unspecified abdominal pain: Secondary | ICD-10-CM | POA: Insufficient documentation

## 2020-02-29 DIAGNOSIS — K469 Unspecified abdominal hernia without obstruction or gangrene: Secondary | ICD-10-CM | POA: Diagnosis not present

## 2020-02-29 DIAGNOSIS — F17210 Nicotine dependence, cigarettes, uncomplicated: Secondary | ICD-10-CM | POA: Diagnosis not present

## 2020-02-29 DIAGNOSIS — I10 Essential (primary) hypertension: Secondary | ICD-10-CM | POA: Insufficient documentation

## 2020-02-29 DIAGNOSIS — E11621 Type 2 diabetes mellitus with foot ulcer: Secondary | ICD-10-CM | POA: Insufficient documentation

## 2020-02-29 DIAGNOSIS — K0889 Other specified disorders of teeth and supporting structures: Secondary | ICD-10-CM | POA: Diagnosis not present

## 2020-02-29 DIAGNOSIS — Z79899 Other long term (current) drug therapy: Secondary | ICD-10-CM | POA: Diagnosis not present

## 2020-02-29 DIAGNOSIS — I700 Atherosclerosis of aorta: Secondary | ICD-10-CM | POA: Diagnosis not present

## 2020-02-29 DIAGNOSIS — Z7982 Long term (current) use of aspirin: Secondary | ICD-10-CM | POA: Diagnosis not present

## 2020-02-29 DIAGNOSIS — Z794 Long term (current) use of insulin: Secondary | ICD-10-CM | POA: Insufficient documentation

## 2020-02-29 DIAGNOSIS — K439 Ventral hernia without obstruction or gangrene: Secondary | ICD-10-CM | POA: Diagnosis not present

## 2020-02-29 DIAGNOSIS — L97519 Non-pressure chronic ulcer of other part of right foot with unspecified severity: Secondary | ICD-10-CM | POA: Insufficient documentation

## 2020-02-29 DIAGNOSIS — N390 Urinary tract infection, site not specified: Secondary | ICD-10-CM | POA: Insufficient documentation

## 2020-02-29 DIAGNOSIS — Z96641 Presence of right artificial hip joint: Secondary | ICD-10-CM | POA: Diagnosis not present

## 2020-02-29 DIAGNOSIS — E278 Other specified disorders of adrenal gland: Secondary | ICD-10-CM | POA: Diagnosis not present

## 2020-02-29 LAB — URINALYSIS, COMPLETE (UACMP) WITH MICROSCOPIC
Bilirubin Urine: NEGATIVE
Glucose, UA: >=500 mg/dL — AB
Hgb urine dipstick: NEGATIVE
Ketones, ur: 5 mg/dL — AB
Nitrite: POSITIVE — AB
Protein, ur: >=300 mg/dL — AB
Specific Gravity, Urine: 1.022 (ref 1.005–1.030)
WBC, UA: >50 WBC/hpf — ABNORMAL HIGH (ref 0–5)
pH: 5 (ref 5.0–8.0)

## 2020-02-29 MED ORDER — CEFDINIR 300 MG PO CAPS: 300.0000 mg | ORAL_CAPSULE | Freq: Two times a day (BID) | ORAL | 0 refills | Status: DC

## 2020-02-29 MED ORDER — CEFTRIAXONE SODIUM 1 G IJ SOLR
1.0000 g | Freq: Once | INTRAMUSCULAR | Status: AC
  Administered 2020-02-29: 1 g via INTRAMUSCULAR
  Filled 2020-02-29: qty 10

## 2020-02-29 MED ORDER — LIDOCAINE HCL (PF) 1 % IJ SOLN
5.0000 mL | Freq: Once | INTRAMUSCULAR | Status: AC
  Administered 2020-02-29: 5 mL via INTRADERMAL
  Filled 2020-02-29: qty 5

## 2020-02-29 NOTE — ED Provider Notes (Signed)
[facility] Emergency Department Provider Note  ____________________________________________   First MD Initiated Contact with Patient 02/29/20 1746     (approximate)  I have reviewed the triage vital signs and the nursing notes.   HISTORY  Chief Complaint Urinary Tract Infection and Dental Pain    HPI Amy Meyers is a 59 y.o. female presents emergency department complaining of a UTI and dental pain.  Patient states that she burns when she pees.  Had some mid thoracic back pain on Friday.  Does have history of kidney stones.  She denies fever or chills.  Some nausea but states this is normal for her.  No vomiting.    Past Medical History:  Diagnosis Date  . Anxiety   . Arthritis   . Diabetes mellitus without complication ([place])   . Diabetic foot ulcer ([place]) 04/06/2015  . Hypertension   . Kidney infection   . Open wound of right foot   . Staph infection    right foot    Patient Active Problem List   Diagnosis Date Noted  . Proteinuria 12/27/2016  . Callous ulcer ([place]) 05/11/2015  . Anxiety 04/13/2015  . Arthritis, degenerative 04/06/2015  . Neuropathy 04/06/2015  . Back pain, chronic 04/06/2015  . Abnormal ECG 03/17/2015  . Hypertension 03/17/2015  . NAFLD (nonalcoholic fatty liver disease) 03/17/2015  . Morbid obesity ([place]) 03/17/2015  . Palpitations 03/17/2015  . Pulmonary edema 03/17/2015  . PVC's (premature ventricular contractions) 03/17/2015  . Breath shortness 03/17/2015  . Headache, tension-type 03/17/2015  . Type 2 diabetes mellitus with right diabetic foot ulcer ([place]) 01/06/2015  . Facet syndrome, lumbar 11/26/2014  . Sacroiliac joint dysfunction 11/26/2014  . DDD (degenerative disc disease), thoracolumbar 09/29/2014  . Diabetes mellitus due to underlying condition with diabetic neuropathy ([place]) 09/29/2014  . 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 Osteomyelitis of lower leg ([place])  03/11/2014  . Current tobacco use 12/19/2013  . Systemic inflammatory response syndrome (SIRS) ([place]) 12/16/2013  . Arthritis 12/16/2013    Past Surgical History:  Procedure Laterality Date  . ABDOMINAL ADHESION SURGERY    . ABDOMINAL HYSTERECTOMY  1998   with oophorectomy due to adhesions  . CARPAL TUNNEL RELEASE Bilateral   . [place]   x 2   . CHOLECYSTECTOMY  1981  . FOOT SURGERY Left   . JOINT REPLACEMENT     right hip replacement  . TONSILLECTOMY  1980  . TONSILLECTOMY    . upper GI X ray series  06/04/2001   Hiatal Hernia    Prior to Admission medications   Medication Sig Start Date End Date Taking? Authorizing Provider  amphetamine-dextroamphetamine (ADDERALL) 20 MG tablet  04/03/19   [provider]  aspirin 325 MG tablet Take 325 mg by mouth daily. Reported on 07/30/2015    [provider]  Blood Glucose Monitoring Suppl (ONE TOUCH ULTRA MINI) w/Device KIT Use to check blood sugar once a day, Dm2 E11.65 09/28/17   Birdie Sons, MD  canagliflozin [facility]) 100 MG TABS tablet Take 1 tablet (100 mg total) by mouth daily before breakfast. 05/25/19   Birdie Sons, MD  cefdinir (OMNICEF) 300 MG capsule Take 1 capsule (300 mg total) by mouth 2 (two) times daily. 02/29/20   Jordyn Hofacker, Linden Dolin, [name]  gabapentin (NEURONTIN) 300 MG capsule  04/03/19   [provider]  insulin glargine (LANTUS) 100 UNIT/ML injection INJECT 100 UNITS SUBCUTANEOUSLY TWICE  DAILY 02/09/20   Birdie Sons, MD  Insulin Syringe 27G X 1/2" 1 ML MISC 1 Units by Does not apply route daily. For insulin dependent type 2 diabetes 05/06/19   Birdie Sons, MD  methadone (DOLOPHINE) 10 MG tablet Limit 5-7 tabs by mouth per day if tolerated Patient taking differently: Take 10 mg by mouth. Limit 5-7 tabs by mouth per day if tolerated 01/13/16   Mohammed Kindle, MD  methocarbamol (ROBAXIN) 500 MG tablet Take 1 tablet (500 mg total) by mouth every 8 (eight) hours as  needed. 04/11/18   Johnn Hai, [name]  NARCAN 4 MG/0.1ML LIQD nasal spray kit  02/18/19   [provider]  NOVOLOG FLEXPEN 100 UNIT/ML FlexPen INJECT 0-20 UNITS 3 TIMES A DAY BEFORE MEALS PER SLIDING SCALE PER MD 08/06/19   Birdie Sons, MD  oxyCODONE (OXY IR/ROXICODONE) 5 MG immediate release tablet Limit 4 - 6 tabs po / day for breakthrough pain while taking methadone if tolerated 01/13/16   Mohammed Kindle, MD  pioglitazone (ACTOS) 15 MG tablet Take 1 tablet (15 mg total) by mouth daily. Patient not taking: Reported on 05/05/2019 09/28/17   Birdie Sons, MD    Allergies Meloxicam and Sulfa antibiotics  Family History  Problem Relation Age of Onset  . Alcohol abuse Mother   . Arthritis Mother   . COPD Mother   . Depression Mother   . Diabetes Mother   . Alcohol abuse Father   . Depression Father   . Early death Father   . Learning disabilities Father   . Alcohol abuse Brother   . Arthritis Brother   . Diabetes Maternal Grandmother   . Varicose Veins Paternal Grandmother   . Diabetes Maternal Grandfather   . Alcohol abuse Paternal Grandfather     Social History Social History   Tobacco Use  . Smoking status: Current Every Day Smoker    Packs/day: 1.00    Years: 44.00    Pack years: 44.00    Types: Cigarettes  . Smokeless tobacco: Current User    Types: Chew  Substance Use Topics  . Alcohol use: No    Alcohol/week: 0.0 standard drinks  . Drug use: No    Review of Systems  Constitutional: No fever/chills Eyes: No visual changes. ENT: No sore throat. Respiratory: Denies cough Cardiovascular: Denies chest pain Gastrointestinal: Denies abdominal pain Genitourinary: Positive for dysuria. Musculoskeletal: Negative for back pain. Skin: Negative for rash. Psychiatric: no mood changes,     ____________________________________________   PHYSICAL EXAM:  VITAL SIGNS: ED Triage Vitals  Enc Vitals Group     BP 02/29/20 1630 (!) 153/67     Pulse  Rate 02/29/20 1630 81     Resp 02/29/20 1630 18     Temp 02/29/20 1634 98.9 F (37.2 C)     Temp Source 02/29/20 1634 Oral     SpO2 02/29/20 1630 99 %     Weight 02/29/20 1632 200 lb (90.7 kg)     Height 02/29/20 1632 '5\' 6"'  (1.676 m)     Head Circumference --      Peak Flow --      Pain Score 02/29/20 1632 5     Pain Loc --      Pain Edu? --      Excl. in [place]? --     Constitutional: Alert and oriented. Well appearing and in no acute distress. Eyes: Conjunctivae are normal.  Head: Atraumatic. Nose: No congestion/rhinnorhea. Mouth/Throat: Mucous membranes  are moist.   Neck:  supple no lymphadenopathy noted Cardiovascular: Normal rate, regular rhythm. Heart sounds are normal Respiratory: Normal respiratory effort.  No retractions, lungs c t a   Abd: soft nontender bs normal all 4 quad, positive CVA tenderness bilaterally GU: deferred Musculoskeletal: FROM all extremities, warm and well perfused Neurologic:  Normal speech and language.  Skin:  Skin is warm, dry and intact. No rash noted. Psychiatric: Mood and affect are normal. Speech and behavior are normal.  ____________________________________________   LABS (all labs ordered are listed, but only abnormal results are displayed)  Labs Reviewed  URINALYSIS, COMPLETE (UACMP) WITH MICROSCOPIC - Abnormal; Notable for the following components:      Result Value   Color, Urine AMBER (*)    APPearance CLOUDY (*)    Glucose, UA >=500 (*)    Ketones, ur 5 (*)    Protein, ur >=300 (*)    Nitrite POSITIVE (*)    Leukocytes,Ua MODERATE (*)    WBC, UA >50 (*)    Bacteria, UA MANY (*)    All other components within normal limits  URINE CULTURE   ____________________________________________   ____________________________________________  RADIOLOGY  CT renal stone is negative for an acute abnormality  ____________________________________________   PROCEDURES  Procedure(s) performed:  No  Procedures    ____________________________________________   INITIAL IMPRESSION / ASSESSMENT AND PLAN / ED COURSE  Pertinent labs & imaging results that were available during my care of the patient were reviewed by me and considered in my medical decision making (see chart for details).   Patient 59 year old female presents emergency department with dental and UTI concerns.  See HPI.  Physical exam shows some CVA tenderness but is otherwise unremarkable.  DDx: UTI, dental abscess, pyelonephritis, infected kidney stone  UA is cloudy with greater than 500 glucose, nitrites positive, moderate leuks, greater than 50 WBCs and many bacteria, WBC clumps, mucus, and hyaline cast are all present.  Due to the hyaline cast and the CVA tenderness I feel that we should do a CT renal stone study to ensure she is not in pyelonephritis along with a infected kidney stone.  Plan is to discharge with antibiotics if the CT is negative.  CT is negative for any acute abnormality.  Patient was given Rocephin 1 g IM.  Be discharged with [place].  Return to the emergency department if worsening.  Discharged in stable condition      Amy Meyers was evaluated in Emergency Department on 02/29/2020 for the symptoms described in the history of present illness. She was evaluated in the context of the global COVID-19 pandemic, which necessitated consideration that the patient might be at risk for infection with the SARS-CoV-2 virus that causes COVID-19. Institutional protocols and algorithms that pertain to the evaluation of patients at risk for COVID-19 are in a state of rapid change based on information released by regulatory bodies including the CDC and federal and state organizations. These policies and algorithms were followed during the patient's care in the ED.    As part of my medical decision making, I reviewed the following data within the [facility] notes reviewed and  incorporated, Labs reviewed , Old chart reviewed, Radiograph reviewed , Notes from prior ED visits and [place] Controlled Substance Database  ____________________________________________   FINAL CLINICAL IMPRESSION(S) / ED DIAGNOSES  Final diagnoses:  Acute UTI      NEW MEDICATIONS STARTED DURING THIS VISIT:  New Prescriptions   CEFDINIR (OMNICEF) 300 MG  CAPSULE    Take 1 capsule (300 mg total) by mouth 2 (two) times daily.     Note:  This document was prepared using Dragon voice recognition software and may include unintentional dictation errors.    Versie Starks, [name] 02/29/20 2005    Lucrezia Starch, MD 03/01/20 Dyann Kief

## 2020-02-29 NOTE — ED Triage Notes (Signed)
Pt comes pov with dental pain and a possible uti.

## 2020-02-29 NOTE — Discharge Instructions (Signed)
Follow-up with your regular doctor if not improving in 2 to 3 days.  Return emergency department worsening.  Take antibiotic as prescribed.

## 2020-03-02 LAB — URINE CULTURE: Culture: >=100000 — AB

## 2020-03-03 NOTE — Progress Notes (Signed)
ED Antimicrobial Stewardship Positive Culture Follow Up   Amy Meyers is an 59 y.o. female who presented to [facility] on 02/29/2020 with a chief complaint of  Chief Complaint  Patient presents with  . Urinary Tract Infection  . Dental Pain    Recent Results (from the past 720 hour(s))  Urine Culture     Status: Abnormal   Collection Time: 02/29/20  4:35 PM   Specimen: Urine, Random  Result Value Ref Range Status   Specimen Description   Final    URINE, RANDOM Performed at [facility], [address], [place], [place] [address]    Special Requests   Final    NONE Performed at [facility], [address] [place], [place] [address]    Culture >=100,000 COLONIES/mL ESCHERICHIA COLI (A)  Final   Report Status 03/02/2020 FINAL  Final   Organism ID, Bacteria ESCHERICHIA COLI (A)  Final      Susceptibility   Escherichia coli - MIC*    AMPICILLIN >=32 RESISTANT Resistant     CEFAZOLIN <=4 SENSITIVE Sensitive     CEFTRIAXONE <=0.25 SENSITIVE Sensitive     CIPROFLOXACIN <=0.25 SENSITIVE Sensitive     GENTAMICIN <=1 SENSITIVE Sensitive     IMIPENEM <=0.25 SENSITIVE Sensitive     NITROFURANTOIN <=16 SENSITIVE Sensitive     TRIMETH/SULFA <=20 SENSITIVE Sensitive     AMPICILLIN/SULBACTAM 16 INTERMEDIATE Intermediate     PIP/TAZO <=4 SENSITIVE Sensitive     * >=100,000 COLONIES/mL ESCHERICHIA COLI    [x]  Treated with cefdinir, organism susceptible to prescribed antimicrobial []  Patient discharged originally without antimicrobial agent and treatment is now indicated  New antibiotic prescription: no changes to antibiotic regimen necessary   Mills Koller 03/03/2020, 12:54 PM Clinical Pharmacist

## 2020-03-10 DIAGNOSIS — G894 Chronic pain syndrome: Secondary | ICD-10-CM | POA: Diagnosis not present

## 2020-03-10 DIAGNOSIS — M5136 Other intervertebral disc degeneration, lumbar region: Secondary | ICD-10-CM | POA: Diagnosis not present

## 2020-03-10 DIAGNOSIS — Z79891 Long term (current) use of opiate analgesic: Secondary | ICD-10-CM | POA: Diagnosis not present

## 2020-03-10 DIAGNOSIS — M259 Joint disorder, unspecified: Secondary | ICD-10-CM | POA: Diagnosis not present

## 2020-04-12 DIAGNOSIS — M79669 Pain in unspecified lower leg: Secondary | ICD-10-CM | POA: Diagnosis not present

## 2020-04-12 DIAGNOSIS — M4807 Spinal stenosis, lumbosacral region: Secondary | ICD-10-CM | POA: Diagnosis not present

## 2020-04-12 DIAGNOSIS — M792 Neuralgia and neuritis, unspecified: Secondary | ICD-10-CM | POA: Diagnosis not present

## 2020-04-12 DIAGNOSIS — M179 Osteoarthritis of knee, unspecified: Secondary | ICD-10-CM | POA: Diagnosis not present

## 2020-04-12 DIAGNOSIS — M48062 Spinal stenosis, lumbar region with neurogenic claudication: Secondary | ICD-10-CM | POA: Diagnosis not present

## 2020-04-12 DIAGNOSIS — Z79891 Long term (current) use of opiate analgesic: Secondary | ICD-10-CM | POA: Diagnosis not present

## 2020-04-12 DIAGNOSIS — M47897 Other spondylosis, lumbosacral region: Secondary | ICD-10-CM | POA: Diagnosis not present

## 2020-04-12 DIAGNOSIS — G8929 Other chronic pain: Secondary | ICD-10-CM | POA: Diagnosis not present

## 2020-04-12 DIAGNOSIS — M4726 Other spondylosis with radiculopathy, lumbar region: Secondary | ICD-10-CM | POA: Diagnosis not present

## 2020-04-12 DIAGNOSIS — E0840 Diabetes mellitus due to underlying condition with diabetic neuropathy, unspecified: Secondary | ICD-10-CM | POA: Diagnosis not present

## 2020-04-12 DIAGNOSIS — M5136 Other intervertebral disc degeneration, lumbar region: Secondary | ICD-10-CM | POA: Diagnosis not present

## 2020-04-12 DIAGNOSIS — M25559 Pain in unspecified hip: Secondary | ICD-10-CM | POA: Diagnosis not present

## 2020-04-12 DIAGNOSIS — M19012 Primary osteoarthritis, left shoulder: Secondary | ICD-10-CM | POA: Diagnosis not present

## 2020-05-10 DIAGNOSIS — G894 Chronic pain syndrome: Secondary | ICD-10-CM | POA: Diagnosis not present

## 2020-05-10 DIAGNOSIS — Z79891 Long term (current) use of opiate analgesic: Secondary | ICD-10-CM | POA: Diagnosis not present

## 2020-05-10 DIAGNOSIS — M179 Osteoarthritis of knee, unspecified: Secondary | ICD-10-CM | POA: Diagnosis not present

## 2020-05-10 DIAGNOSIS — M5136 Other intervertebral disc degeneration, lumbar region: Secondary | ICD-10-CM | POA: Diagnosis not present

## 2020-05-10 DIAGNOSIS — M19012 Primary osteoarthritis, left shoulder: Secondary | ICD-10-CM | POA: Diagnosis not present

## 2020-06-07 DIAGNOSIS — M179 Osteoarthritis of knee, unspecified: Secondary | ICD-10-CM | POA: Diagnosis not present

## 2020-06-07 DIAGNOSIS — M19012 Primary osteoarthritis, left shoulder: Secondary | ICD-10-CM | POA: Diagnosis not present

## 2020-06-07 DIAGNOSIS — M5136 Other intervertebral disc degeneration, lumbar region: Secondary | ICD-10-CM | POA: Diagnosis not present

## 2020-06-07 DIAGNOSIS — Z79891 Long term (current) use of opiate analgesic: Secondary | ICD-10-CM | POA: Diagnosis not present

## 2020-06-10 DIAGNOSIS — F900 Attention-deficit hyperactivity disorder, predominantly inattentive type: Secondary | ICD-10-CM | POA: Diagnosis not present

## 2020-06-10 DIAGNOSIS — F339 Major depressive disorder, recurrent, unspecified: Secondary | ICD-10-CM | POA: Diagnosis not present

## 2020-06-11 ENCOUNTER — Ambulatory Visit: Payer: Medicare Other | Admitting: Family Medicine

## 2020-06-23 ENCOUNTER — Encounter: Payer: Self-pay | Admitting: Emergency Medicine

## 2020-06-23 ENCOUNTER — Other Ambulatory Visit: Payer: Self-pay

## 2020-06-23 ENCOUNTER — Emergency Department
Admission: EM | Admit: 2020-06-23 | Discharge: 2020-06-23 | Disposition: A | Discharge status: Home / Self Care | Payer: Medicare Other | Attending: Student in an Organized Health Care Education/Training Program | Admitting: Student in an Organized Health Care Education/Training Program

## 2020-06-23 DIAGNOSIS — R519 Headache, unspecified: Secondary | ICD-10-CM | POA: Insufficient documentation

## 2020-06-23 DIAGNOSIS — R112 Nausea with vomiting, unspecified: Secondary | ICD-10-CM | POA: Insufficient documentation

## 2020-06-23 DIAGNOSIS — Z5321 Procedure and treatment not carried out due to patient leaving prior to being seen by health care provider: Secondary | ICD-10-CM | POA: Diagnosis not present

## 2020-06-23 DIAGNOSIS — R42 Dizziness and giddiness: Secondary | ICD-10-CM | POA: Insufficient documentation

## 2020-06-23 LAB — CBC
HCT: 47.2 % — ABNORMAL HIGH (ref 36.0–46.0)
Hemoglobin: 15.5 g/dL — ABNORMAL HIGH (ref 12.0–15.0)
MCH: 28.7 pg (ref 26.0–34.0)
MCHC: 32.8 g/dL (ref 30.0–36.0)
MCV: 87.4 fL (ref 80.0–100.0)
Platelets: 164 10*3/uL (ref 150–400)
RBC: 5.4 MIL/uL — ABNORMAL HIGH (ref 3.87–5.11)
RDW: 14 % (ref 11.5–15.5)
WBC: 9.9 10*3/uL (ref 4.0–10.5)
nRBC: 0 % (ref 0.0–0.2)

## 2020-06-23 LAB — BASIC METABOLIC PANEL
Anion gap: 7 (ref 5–15)
BUN: 13 mg/dL (ref 8–23)
CO2: 32 mmol/L (ref 22–32)
Calcium: 8.9 mg/dL (ref 8.9–10.3)
Chloride: 96 mmol/L — ABNORMAL LOW (ref 98–111)
Creatinine, Ser: 0.68 mg/dL (ref 0.44–1.00)
GFR, Estimated: >60 mL/min (ref >60)
Glucose, Bld: 212 mg/dL — ABNORMAL HIGH (ref 70–99)
Potassium: 5 mmol/L (ref 3.5–5.1)
Sodium: 135 mmol/L (ref 135–145)

## 2020-06-23 NOTE — ED Triage Notes (Signed)
Pt here with c/o headache for a week, has had intermittent vertigo for a couple of years and her occasional "room spinning" feeling, worse when she is laying down and she turns her head to the side for the past few days. Pt states her headache has led to nausea/vomiting. NAD.

## 2020-07-14 DIAGNOSIS — Z79891 Long term (current) use of opiate analgesic: Secondary | ICD-10-CM | POA: Diagnosis not present

## 2020-07-14 DIAGNOSIS — M47897 Other spondylosis, lumbosacral region: Secondary | ICD-10-CM | POA: Diagnosis not present

## 2020-07-14 DIAGNOSIS — G8929 Other chronic pain: Secondary | ICD-10-CM | POA: Diagnosis not present

## 2020-07-14 DIAGNOSIS — E0840 Diabetes mellitus due to underlying condition with diabetic neuropathy, unspecified: Secondary | ICD-10-CM | POA: Diagnosis not present

## 2020-07-14 DIAGNOSIS — M48062 Spinal stenosis, lumbar region with neurogenic claudication: Secondary | ICD-10-CM | POA: Diagnosis not present

## 2020-07-14 DIAGNOSIS — M259 Joint disorder, unspecified: Secondary | ICD-10-CM | POA: Diagnosis not present

## 2020-07-14 DIAGNOSIS — M4726 Other spondylosis with radiculopathy, lumbar region: Secondary | ICD-10-CM | POA: Diagnosis not present

## 2020-07-14 DIAGNOSIS — M792 Neuralgia and neuritis, unspecified: Secondary | ICD-10-CM | POA: Diagnosis not present

## 2020-07-14 DIAGNOSIS — M4807 Spinal stenosis, lumbosacral region: Secondary | ICD-10-CM | POA: Diagnosis not present

## 2020-07-14 DIAGNOSIS — M79669 Pain in unspecified lower leg: Secondary | ICD-10-CM | POA: Diagnosis not present

## 2020-07-14 DIAGNOSIS — E6609 Other obesity due to excess calories: Secondary | ICD-10-CM | POA: Diagnosis not present

## 2020-07-14 DIAGNOSIS — G894 Chronic pain syndrome: Secondary | ICD-10-CM | POA: Diagnosis not present

## 2020-07-14 DIAGNOSIS — M25559 Pain in unspecified hip: Secondary | ICD-10-CM | POA: Diagnosis not present

## 2020-07-14 DIAGNOSIS — M5136 Other intervertebral disc degeneration, lumbar region: Secondary | ICD-10-CM | POA: Diagnosis not present

## 2020-08-11 DIAGNOSIS — M259 Joint disorder, unspecified: Secondary | ICD-10-CM | POA: Diagnosis not present

## 2020-08-11 DIAGNOSIS — M5136 Other intervertebral disc degeneration, lumbar region: Secondary | ICD-10-CM | POA: Diagnosis not present

## 2020-08-11 DIAGNOSIS — Z79891 Long term (current) use of opiate analgesic: Secondary | ICD-10-CM | POA: Diagnosis not present

## 2020-08-11 DIAGNOSIS — G894 Chronic pain syndrome: Secondary | ICD-10-CM | POA: Diagnosis not present

## 2020-09-08 DIAGNOSIS — M259 Joint disorder, unspecified: Secondary | ICD-10-CM | POA: Diagnosis not present

## 2020-09-08 DIAGNOSIS — Z79891 Long term (current) use of opiate analgesic: Secondary | ICD-10-CM | POA: Diagnosis not present

## 2020-09-08 DIAGNOSIS — G894 Chronic pain syndrome: Secondary | ICD-10-CM | POA: Diagnosis not present

## 2020-09-08 DIAGNOSIS — M5136 Other intervertebral disc degeneration, lumbar region: Secondary | ICD-10-CM | POA: Diagnosis not present

## 2020-09-30 DIAGNOSIS — M259 Joint disorder, unspecified: Secondary | ICD-10-CM | POA: Diagnosis not present

## 2020-09-30 DIAGNOSIS — M5136 Other intervertebral disc degeneration, lumbar region: Secondary | ICD-10-CM | POA: Diagnosis not present

## 2020-09-30 DIAGNOSIS — Z79891 Long term (current) use of opiate analgesic: Secondary | ICD-10-CM | POA: Diagnosis not present

## 2020-09-30 DIAGNOSIS — G894 Chronic pain syndrome: Secondary | ICD-10-CM | POA: Diagnosis not present

## 2020-11-03 DIAGNOSIS — M545 Low back pain, unspecified: Secondary | ICD-10-CM | POA: Diagnosis not present

## 2020-11-03 DIAGNOSIS — G894 Chronic pain syndrome: Secondary | ICD-10-CM | POA: Diagnosis not present

## 2020-11-03 DIAGNOSIS — M5136 Other intervertebral disc degeneration, lumbar region: Secondary | ICD-10-CM | POA: Diagnosis not present

## 2020-11-03 DIAGNOSIS — M259 Joint disorder, unspecified: Secondary | ICD-10-CM | POA: Diagnosis not present

## 2020-12-08 DIAGNOSIS — M5136 Other intervertebral disc degeneration, lumbar region: Secondary | ICD-10-CM | POA: Diagnosis not present

## 2020-12-08 DIAGNOSIS — M545 Low back pain, unspecified: Secondary | ICD-10-CM | POA: Diagnosis not present

## 2020-12-08 DIAGNOSIS — G894 Chronic pain syndrome: Secondary | ICD-10-CM | POA: Diagnosis not present

## 2020-12-08 DIAGNOSIS — M179 Osteoarthritis of knee, unspecified: Secondary | ICD-10-CM | POA: Diagnosis not present

## 2021-01-05 DIAGNOSIS — Z79891 Long term (current) use of opiate analgesic: Secondary | ICD-10-CM | POA: Diagnosis not present

## 2021-01-05 DIAGNOSIS — M5136 Other intervertebral disc degeneration, lumbar region: Secondary | ICD-10-CM | POA: Diagnosis not present

## 2021-01-05 DIAGNOSIS — M259 Joint disorder, unspecified: Secondary | ICD-10-CM | POA: Diagnosis not present

## 2021-01-05 DIAGNOSIS — G894 Chronic pain syndrome: Secondary | ICD-10-CM | POA: Diagnosis not present

## 2021-01-06 DIAGNOSIS — Z79899 Other long term (current) drug therapy: Secondary | ICD-10-CM | POA: Diagnosis not present

## 2021-01-06 DIAGNOSIS — F900 Attention-deficit hyperactivity disorder, predominantly inattentive type: Secondary | ICD-10-CM | POA: Diagnosis not present

## 2021-01-06 DIAGNOSIS — F339 Major depressive disorder, recurrent, unspecified: Secondary | ICD-10-CM | POA: Diagnosis not present

## 2021-02-08 DIAGNOSIS — G8929 Other chronic pain: Secondary | ICD-10-CM | POA: Diagnosis not present

## 2021-02-08 DIAGNOSIS — E6609 Other obesity due to excess calories: Secondary | ICD-10-CM | POA: Diagnosis not present

## 2021-02-08 DIAGNOSIS — M259 Joint disorder, unspecified: Secondary | ICD-10-CM | POA: Diagnosis not present

## 2021-02-08 DIAGNOSIS — G894 Chronic pain syndrome: Secondary | ICD-10-CM | POA: Diagnosis not present

## 2021-02-08 DIAGNOSIS — M48062 Spinal stenosis, lumbar region with neurogenic claudication: Secondary | ICD-10-CM | POA: Diagnosis not present

## 2021-02-08 DIAGNOSIS — M4726 Other spondylosis with radiculopathy, lumbar region: Secondary | ICD-10-CM | POA: Diagnosis not present

## 2021-02-08 DIAGNOSIS — M5136 Other intervertebral disc degeneration, lumbar region: Secondary | ICD-10-CM | POA: Diagnosis not present

## 2021-02-08 DIAGNOSIS — M25559 Pain in unspecified hip: Secondary | ICD-10-CM | POA: Diagnosis not present

## 2021-02-08 DIAGNOSIS — M545 Low back pain, unspecified: Secondary | ICD-10-CM | POA: Diagnosis not present

## 2021-02-08 DIAGNOSIS — M79669 Pain in unspecified lower leg: Secondary | ICD-10-CM | POA: Diagnosis not present

## 2021-02-08 DIAGNOSIS — E0840 Diabetes mellitus due to underlying condition with diabetic neuropathy, unspecified: Secondary | ICD-10-CM | POA: Diagnosis not present

## 2021-02-12 ENCOUNTER — Other Ambulatory Visit: Payer: Self-pay | Admitting: Family Medicine

## 2021-02-12 DIAGNOSIS — Z794 Long term (current) use of insulin: Secondary | ICD-10-CM

## 2021-02-12 DIAGNOSIS — E1140 Type 2 diabetes mellitus with diabetic neuropathy, unspecified: Secondary | ICD-10-CM

## 2021-02-13 NOTE — Telephone Encounter (Signed)
Requested medication (s) are due for refill today: yes  Requested medication (s) are on the active medication list: yes  Last refill:  02/09/20 30 ml 12 RF  Future visit scheduled: no  Notes to clinic: attempted to call pt but phone is not in service   Requested Prescriptions  Pending Prescriptions Disp Refills   LANTUS 100 UNIT/ML injection [Pharmacy Med Name: Lantus U-100 Insulin 100 unit/mL subcutaneous solution] 30 mL 12    Sig: INJECT [place]     Endocrinology:  Diabetes - Insulins Failed - 02/12/2021 12:03 PM      Failed - HBA1C is between 0 and 7.9 and within 180 days    Hgb A1c MFr Bld  Date Value Ref Range Status  05/05/2019 8.7 (H) 4.8 - 5.6 % Final    Comment:             Prediabetes: 5.7 - 6.4          Diabetes: >6.4          Glycemic control for adults with diabetes: <7.0           Failed - Valid encounter within last 6 months    Recent Outpatient Visits           1 year ago Type 2 diabetes mellitus with diabetic neuropathy, with long-term current use of insulin ([place])   [facility] Birdie Sons, MD   3 years ago Type 2 diabetes mellitus with diabetic neuropathy, with long-term current use of insulin ([place])   [facility] Birdie Sons, MD   4 years ago Type 2 diabetes mellitus with diabetic neuropathy, with long-term current use of insulin [facility])   [facility] Birdie Sons, MD   4 years ago Dental infection   [facility] Birdie Sons, MD   4 years ago Urinary tract infection with hematuria, site unspecified   [place], [place], [place]

## 2021-02-26 ENCOUNTER — Emergency Department
Admission: EM | Admit: 2021-02-26 | Discharge: 2021-02-26 | Disposition: A | Discharge status: Home / Self Care | Payer: Medicare Other | Attending: Emergency Medicine | Admitting: Emergency Medicine

## 2021-02-26 ENCOUNTER — Emergency Department: Payer: Medicare Other

## 2021-02-26 ENCOUNTER — Other Ambulatory Visit: Payer: Self-pay

## 2021-02-26 ENCOUNTER — Encounter: Payer: Self-pay | Admitting: Intensive Care

## 2021-02-26 DIAGNOSIS — E11621 Type 2 diabetes mellitus with foot ulcer: Secondary | ICD-10-CM | POA: Diagnosis not present

## 2021-02-26 DIAGNOSIS — F17210 Nicotine dependence, cigarettes, uncomplicated: Secondary | ICD-10-CM | POA: Insufficient documentation

## 2021-02-26 DIAGNOSIS — Z7982 Long term (current) use of aspirin: Secondary | ICD-10-CM | POA: Diagnosis not present

## 2021-02-26 DIAGNOSIS — Z794 Long term (current) use of insulin: Secondary | ICD-10-CM | POA: Insufficient documentation

## 2021-02-26 DIAGNOSIS — E1140 Type 2 diabetes mellitus with diabetic neuropathy, unspecified: Secondary | ICD-10-CM | POA: Diagnosis not present

## 2021-02-26 DIAGNOSIS — I10 Essential (primary) hypertension: Secondary | ICD-10-CM | POA: Insufficient documentation

## 2021-02-26 DIAGNOSIS — R0789 Other chest pain: Secondary | ICD-10-CM | POA: Diagnosis not present

## 2021-02-26 DIAGNOSIS — L97529 Non-pressure chronic ulcer of other part of left foot with unspecified severity: Secondary | ICD-10-CM | POA: Diagnosis not present

## 2021-02-26 DIAGNOSIS — R0781 Pleurodynia: Secondary | ICD-10-CM | POA: Diagnosis not present

## 2021-02-26 DIAGNOSIS — Z96642 Presence of left artificial hip joint: Secondary | ICD-10-CM | POA: Insufficient documentation

## 2021-02-26 MED ORDER — ACETAMINOPHEN 325 MG PO TABS
650.0000 mg | ORAL_TABLET | Freq: Once | ORAL | Status: AC
  Administered 2021-02-26: 650 mg via ORAL
  Filled 2021-02-26: qty 2

## 2021-02-26 NOTE — Discharge Instructions (Signed)
You can take 650 mg of Tylenol every 6 hours as needed for pain.

## 2021-02-26 NOTE — ED Provider Notes (Signed)
[facility]-EMERGENCY DEPARTMENT  ____________________________________________  Time seen: Approximately 5:49 PM  I have reviewed the triage vital signs and the nursing notes.   HISTORY  Chief Complaint Fall   Historian Patient     HPI Amy Meyers is a 60 y.o. female with a history of diabetes and hypertension, presents to the emergency department with right lateral chest wall pain after patient rolled off the bed while sleeping.  She denies current chest pain, chest tightness or shortness of breath.  No nausea, vomiting or abdominal pain.  Patient is secondarily concerned as she bit her tongue.   Past Medical History:  Diagnosis Date   Anxiety    Arthritis    Diabetes mellitus without complication ([place])    Diabetic foot ulcer ([place]) 04/06/2015   Hypertension    Kidney infection    Open wound of right foot    Staph infection    right foot     Immunizations up to date:  Yes.     Past Medical History:  Diagnosis Date   Anxiety    Arthritis    Diabetes mellitus without complication ([place])    Diabetic foot ulcer ([place]) 04/06/2015   Hypertension    Kidney infection    Open wound of right foot    Staph infection    right foot    Patient Active Problem List   Diagnosis Date Noted   Proteinuria 12/27/2016   Callous ulcer ([place]) 05/11/2015   Anxiety 04/13/2015   Arthritis, degenerative 04/06/2015   Neuropathy 04/06/2015   Back pain, chronic 04/06/2015   Abnormal ECG 03/17/2015   Hypertension 03/17/2015   NAFLD (nonalcoholic fatty liver disease) 03/17/2015   Morbid obesity ([place]) 03/17/2015   Palpitations 03/17/2015   Pulmonary edema 03/17/2015   PVC's (premature ventricular contractions) 03/17/2015   Breath shortness 03/17/2015   Headache, tension-type 03/17/2015   Type 2 diabetes mellitus with right diabetic foot ulcer ([place]) 01/06/2015   Facet syndrome, lumbar 11/26/2014   Sacroiliac joint dysfunction 11/26/2014   DDD (degenerative disc disease), thoracolumbar  09/29/2014   Diabetes mellitus due to underlying condition with diabetic neuropathy ([place]) 09/29/2014   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) 09/29/2014   Osteomyelitis of lower leg ([place]) 03/11/2014   Current tobacco use 12/19/2013   Systemic inflammatory response syndrome (SIRS) ([place]) 12/16/2013   Arthritis 12/16/2013    Past Surgical History:  Procedure Laterality Date   ABDOMINAL ADHESION SURGERY     ABDOMINAL HYSTERECTOMY  1998   with oophorectomy due to adhesions   CARPAL TUNNEL RELEASE Bilateral    [place]   x 2    [place] Left    JOINT REPLACEMENT     right hip replacement   TONSILLECTOMY  1980   TONSILLECTOMY     upper GI X ray series  06/04/2001   Hiatal Hernia    Prior to Admission medications   Medication Sig Start Date End Date Taking? Authorizing Provider  amphetamine-dextroamphetamine (ADDERALL) 20 MG tablet  04/03/19   [provider]  aspirin 325 MG tablet Take 325 mg by mouth daily. Reported on 07/30/2015    [provider]  Blood Glucose Monitoring Suppl (ONE TOUCH ULTRA MINI) w/Device KIT Use to check blood sugar once a day, Dm2 E11.65 09/28/17   Birdie Sons, MD  canagliflozin [facility]) 100 MG TABS tablet Take 1 tablet (100 mg total) by mouth daily before breakfast. 05/25/19   Fisher,  Kirstie Peri, MD  cefdinir (OMNICEF) 300 MG capsule Take 1 capsule (300 mg total) by mouth 2 (two) times daily. 02/29/20   Fisher, Linden Dolin, [name]  gabapentin (NEURONTIN) 300 MG capsule  04/03/19   [provider]  insulin glargine (LANTUS) 100 UNIT/ML injection INJECT 100 UNITS SUBCUTANEOUSLY TWICE DAILY 02/15/21   Birdie Sons, MD  Insulin Syringe 27G X 1/2" 1 ML MISC 1 Units by Does not apply route daily. For insulin dependent type 2 diabetes 05/06/19   Birdie Sons, MD  methadone (DOLOPHINE) 10 MG tablet Limit 5-7 tabs by mouth per day if tolerated Patient taking  differently: Take 10 mg by mouth. Limit 5-7 tabs by mouth per day if tolerated 01/13/16   Mohammed Kindle, MD  methocarbamol (ROBAXIN) 500 MG tablet Take 1 tablet (500 mg total) by mouth every 8 (eight) hours as needed. 04/11/18   Johnn Hai, [name]  NARCAN 4 MG/0.1ML LIQD nasal spray kit  02/18/19   [provider]  NOVOLOG FLEXPEN 100 UNIT/ML FlexPen INJECT 0-20 UNITS 3 TIMES A DAY BEFORE MEALS PER SLIDING SCALE PER MD 08/06/19   Birdie Sons, MD  oxyCODONE (OXY IR/ROXICODONE) 5 MG immediate release tablet Limit 4 - 6 tabs po / day for breakthrough pain while taking methadone if tolerated 01/13/16   Mohammed Kindle, MD  pioglitazone (ACTOS) 15 MG tablet Take 1 tablet (15 mg total) by mouth daily. Patient not taking: Reported on 05/05/2019 09/28/17   Birdie Sons, MD    Allergies Meloxicam and Sulfa antibiotics  Family History  Problem Relation Age of Onset   Alcohol abuse Mother    Arthritis Mother    COPD Mother    Depression Mother    Diabetes Mother    Alcohol abuse Father    Depression Father    Early death Father    Learning disabilities Father    Alcohol abuse Brother    Arthritis Brother    Diabetes Maternal Grandmother    Varicose Veins Paternal Grandmother    Diabetes Maternal Grandfather    Alcohol abuse Paternal Grandfather     Social History Social History   Tobacco Use   Smoking status: Every Day    Packs/day: 1.00    Years: 44.00    Pack years: 44.00    Types: Cigarettes   Smokeless tobacco: Former    Types: Nurse, children's Use: Every day   Substances: Nicotine  Substance Use Topics   Alcohol use: No    Alcohol/week: 0.0 standard drinks   Drug use: No     Review of Systems  Constitutional: No fever/chills Eyes:  No discharge ENT: Patient has tongue pain. Respiratory: no cough. No SOB/ use of accessory muscles to breath Gastrointestinal:   No nausea, no vomiting.  No diarrhea.  No constipation. Musculoskeletal: Patient  has right-sided chest wall pain. Skin: Negative for rash, abrasions, lacerations, ecchymosis.    ____________________________________________   PHYSICAL EXAM:  VITAL SIGNS: ED Triage Vitals  Enc Vitals Group     BP --      Pulse Rate 02/26/21 1416 82     Resp 02/26/21 1416 18     Temp 02/26/21 1416 98.8 F (37.1 C)     Temp Source 02/26/21 1416 Oral     SpO2 02/26/21 1416 96 %     Weight 02/26/21 1417 200 lb (90.7 kg)     Height 02/26/21 1417 _0  (1.651 m)     Head  Circumference --      Peak Flow --      Pain Score 02/26/21 1417 9     Pain Loc --      Pain Edu? --      Excl. in [place]? --      Constitutional: Alert and oriented. Well appearing and in no acute distress. Eyes: Conjunctivae are normal. PERRL. EOMI. Head: Atraumatic. ENT:      Nose: No congestion/rhinnorhea.      Mouth/Throat: Mucous membranes are moist.  No tongue laceration visualized. Neck: No stridor.  No cervical spine tenderness to palpation. Cardiovascular: Normal rate, regular rhythm. Normal S1 and S2.  Good peripheral circulation. Respiratory: Normal respiratory effort without tachypnea or retractions. Lungs CTAB. Good air entry to the bases with no decreased or absent breath sounds Gastrointestinal: Bowel sounds x 4 quadrants. Soft and nontender to palpation. No guarding or rigidity. No distention. Musculoskeletal: Full range of motion to all extremities. No obvious deformities noted.  Patient has tenderness to palpation over right anterior chest wall. Neurologic:  Normal for age. No gross focal neurologic deficits are appreciated.  Skin:  Skin is warm, dry and intact. No rash noted. Psychiatric: Mood and affect are normal for age. Speech and behavior are normal.   ____________________________________________   LABS (all labs ordered are listed, but only abnormal results are displayed)  Labs Reviewed - No data to  display ____________________________________________  EKG   ____________________________________________  RADIOLOGY Unk Pinto, personally viewed and evaluated these images (plain radiographs) as part of my medical decision making, as well as reviewing the written report by the radiologist.  DG Ribs Unilateral W/Chest Right  Result Date: 02/26/2021 CLINICAL DATA:  Fall with anterior lower rib pain. EXAM: RIGHT RIBS AND CHEST - 3+ VIEW COMPARISON:  Chest radiograph dated 12/05/2018. FINDINGS: No fracture or other bone lesions are seen involving the ribs. There is no evidence of pneumothorax or pleural effusion. Both lungs are clear. Heart size and mediastinal contours are within normal limits. Degenerative changes are seen in the spine. IMPRESSION: Negative. Electronically Signed   By: Zerita Boers M.D.   On: 02/26/2021 15:23    ____________________________________________    PROCEDURES  Procedure(s) performed:     Procedures     Medications  acetaminophen (TYLENOL) tablet 650 mg (has no administration in time range)     ____________________________________________   INITIAL IMPRESSION / ASSESSMENT AND PLAN / ED COURSE  Pertinent labs & imaging results that were available during my care of the patient were reviewed by me and considered in my medical decision making (see chart for details).      Assessment and plan Chest wall pain 60 year old female presents to the emergency department with right lateral chest wall tenderness after fall from her bed.  X-ray of the right ribs shows no bony abnormality.  Patient received prescriptions for oxycodone and methadone on 9/13.  Patient was advised to take Tylenol every 6 hours as needed for discomfort.     ____________________________________________  FINAL CLINICAL IMPRESSION(S) / ED DIAGNOSES  Final diagnoses:  Chest wall pain      NEW MEDICATIONS STARTED DURING THIS VISIT:  ED Discharge Orders      None           This chart was dictated using voice recognition software/Dragon. Despite best efforts to proofread, errors can occur which can change the meaning. Any change was purely unintentional.     Lannie Fields, [name] 02/26/21 1753    Archie Balboa,  Carter Kitten, MD 02/26/21 2811

## 2021-02-26 NOTE — ED Triage Notes (Signed)
Patient reports falling out of bed while sleeping. Has cut on chin from hitting nightstand and bit tongue. Also reports right side pain.

## 2021-03-08 DIAGNOSIS — M5136 Other intervertebral disc degeneration, lumbar region: Secondary | ICD-10-CM | POA: Diagnosis not present

## 2021-03-08 DIAGNOSIS — M179 Osteoarthritis of knee, unspecified: Secondary | ICD-10-CM | POA: Diagnosis not present

## 2021-03-08 DIAGNOSIS — G894 Chronic pain syndrome: Secondary | ICD-10-CM | POA: Diagnosis not present

## 2021-03-08 DIAGNOSIS — Z79891 Long term (current) use of opiate analgesic: Secondary | ICD-10-CM | POA: Diagnosis not present

## 2021-03-22 ENCOUNTER — Telehealth: Payer: Self-pay | Admitting: Family Medicine

## 2021-03-22 NOTE — Telephone Encounter (Signed)
Basaglar is the same as Lantus. There are 2 Basalglar sample pens she can have.

## 2021-03-22 NOTE — Telephone Encounter (Signed)
Pts daughter Benjamine Mola called and stated that pt has not had her insulin in a week and they can not afford to pay the $85 for insulin at the moment/ she wanted to know if samples were available / I was advised that this insulin was not available in a sample / is another lower cost option or insulin that is covered available to change to / please advise asap

## 2021-03-22 NOTE — Telephone Encounter (Signed)
LMTCB 03/22/2021.  PEC please advise Ms. Tigue that we have samples of [place] for her.   Thanks,   -Mickel Baas

## 2021-04-05 DIAGNOSIS — M179 Osteoarthritis of knee, unspecified: Secondary | ICD-10-CM | POA: Diagnosis not present

## 2021-04-05 DIAGNOSIS — G894 Chronic pain syndrome: Secondary | ICD-10-CM | POA: Diagnosis not present

## 2021-04-05 DIAGNOSIS — M5136 Other intervertebral disc degeneration, lumbar region: Secondary | ICD-10-CM | POA: Diagnosis not present

## 2021-04-05 DIAGNOSIS — M259 Joint disorder, unspecified: Secondary | ICD-10-CM | POA: Diagnosis not present

## 2021-05-02 DIAGNOSIS — Z79891 Long term (current) use of opiate analgesic: Secondary | ICD-10-CM | POA: Diagnosis not present

## 2021-05-02 DIAGNOSIS — M5136 Other intervertebral disc degeneration, lumbar region: Secondary | ICD-10-CM | POA: Diagnosis not present

## 2021-05-02 DIAGNOSIS — M259 Joint disorder, unspecified: Secondary | ICD-10-CM | POA: Diagnosis not present

## 2021-05-02 DIAGNOSIS — G894 Chronic pain syndrome: Secondary | ICD-10-CM | POA: Diagnosis not present

## 2021-05-05 ENCOUNTER — Telehealth: Payer: Self-pay

## 2021-05-05 NOTE — Telephone Encounter (Addendum)
I checked our supply of samples. We dont have samples of Lantus or novolog. Please advise on any suggestions or recommendations.   Daughters contact # [phone] Tonye Becket

## 2021-05-05 NOTE — Telephone Encounter (Signed)
Patient's daughter Amy Meyers (has same name as patient) called requesting samples of insulin for her mom. Patient has been out of her insulin for 3 days. They are having financial problems and have not been able to afford medications. I advised [name] that patient is overdue for a diabetes follow up. Patient was last seen 2 years ago. Ms. Stjulien agreed to schedule a follow up appointment for tomorrow at 10:40am with [facility]. Ms. Schmutz wants to know if they can get a sample of insulin today since she has been out of medication for the past 3 days. She has been checking her blood sugars until she ran out of test strips 2 days ago. Her last non fasting glucose reading checked on 05/03/2021 was 98. Please advise.

## 2021-05-06 ENCOUNTER — Ambulatory Visit (INDEPENDENT_AMBULATORY_CARE_PROVIDER_SITE_OTHER): Payer: Medicare Other | Admitting: Physician Assistant

## 2021-05-06 ENCOUNTER — Encounter: Payer: Self-pay | Admitting: Physician Assistant

## 2021-05-06 ENCOUNTER — Telehealth: Payer: Self-pay | Admitting: Family Medicine

## 2021-05-06 ENCOUNTER — Other Ambulatory Visit: Payer: Self-pay

## 2021-05-06 VITALS — BP 140/68 | HR 88 | Temp 98.6°F | Wt 191.0 lb

## 2021-05-06 DIAGNOSIS — Z720 Tobacco use: Secondary | ICD-10-CM | POA: Diagnosis not present

## 2021-05-06 DIAGNOSIS — R809 Proteinuria, unspecified: Secondary | ICD-10-CM | POA: Insufficient documentation

## 2021-05-06 DIAGNOSIS — E1140 Type 2 diabetes mellitus with diabetic neuropathy, unspecified: Secondary | ICD-10-CM

## 2021-05-06 DIAGNOSIS — E1165 Type 2 diabetes mellitus with hyperglycemia: Secondary | ICD-10-CM | POA: Diagnosis not present

## 2021-05-06 DIAGNOSIS — Z794 Long term (current) use of insulin: Secondary | ICD-10-CM | POA: Diagnosis not present

## 2021-05-06 LAB — POCT GLYCOSYLATED HEMOGLOBIN (HGB A1C): Hemoglobin A1C: 11.8 % — AB (ref 4.0–5.6)

## 2021-05-06 MED ORDER — INSULIN GLARGINE 100 UNIT/ML ~~LOC~~ SOLN: 80.0000 [IU] | Freq: Two times a day (BID) | SUBCUTANEOUS | 2 refills | Status: DC

## 2021-05-06 MED ORDER — ONETOUCH ULTRA MINI W/DEVICE KIT: PACK | 0 refills | Status: DC

## 2021-05-06 MED ORDER — CANAGLIFLOZIN 100 MG PO TABS: 100.0000 mg | ORAL_TABLET | Freq: Every day | ORAL | 1 refills | Status: DC

## 2021-05-06 NOTE — Progress Notes (Signed)
Established Patient Office Visit  Subjective:  Patient ID: Amy Meyers, female    DOB: [birth year]  Age: 60 y.o. MRN: [id number]  CC:  Chief Complaint  Patient presents with   Diabetes    HPI Amy Meyers presents for Diabetes management   Reports she has only been her  taking Lantus for over a year ( has not had Invokana or Actos for over a year) and states she has been feeling hypoglycemic on current rx of 100 units BID States she has been out of insulin for 3 days and testing strips for 2 days due to cost.  Patient reports weight loss (10lb weight loss since Oct) Admits to polyuria, polydipsia,  Admits to fatigue,  Denies confusion, LOC   Past Medical History:  Diagnosis Date   Anxiety    Arthritis    Diabetes mellitus without complication ([place])    Diabetic foot ulcer ([place]) 04/06/2015   Hypertension    Kidney infection    Open wound of right foot    Staph infection    right foot    Past Surgical History:  Procedure Laterality Date   ABDOMINAL ADHESION SURGERY     ABDOMINAL HYSTERECTOMY  1998   with oophorectomy due to adhesions   CARPAL TUNNEL RELEASE Bilateral    [place]   x 2    [place] Left    JOINT REPLACEMENT     right hip replacement   TONSILLECTOMY  1980   TONSILLECTOMY     upper GI X ray series  06/04/2001   Hiatal Hernia    Family History  Problem Relation Age of Onset   Alcohol abuse Mother    Arthritis Mother    COPD Mother    Depression Mother    Diabetes Mother    Alcohol abuse Father    Depression Father    Early death Father    Learning disabilities Father    Alcohol abuse Brother    Arthritis Brother    Diabetes Maternal Grandmother    Varicose Veins Paternal Grandmother    Diabetes Maternal Grandfather    Alcohol abuse Paternal Grandfather     Social History   Socioeconomic History   Marital status: Widowed    Spouse name: Not on file   Number of children: 4   Years of  education: Not on file   Highest education level: Not on file  Occupational History   Occupation: Disabled  Tobacco Use   Smoking status: Every Day    Packs/day: 1.00    Years: 44.00    Pack years: 44.00    Types: Cigarettes   Smokeless tobacco: Former    Types: Nurse, children's Use: Every day   Substances: Nicotine  Substance and Sexual Activity   Alcohol use: No    Alcohol/week: 0.0 standard drinks   Drug use: No   Sexual activity: Not on file  Other Topics Concern   Not on file  Social History Narrative   Not on file   Social Determinants of Health   Financial Resource Strain: Not on file  Food Insecurity: Not on file  Transportation Needs: Not on file  Physical Activity: Not on file  Stress: Not on file  Social Connections: Not on file  Intimate Partner Violence: Not on file    Outpatient Medications Prior to Visit  Medication Sig Dispense Refill   amphetamine-dextroamphetamine (ADDERALL) 20 MG tablet  aspirin 325 MG tablet Take 325 mg by mouth daily. Reported on 07/30/2015     gabapentin (NEURONTIN) 300 MG capsule      Insulin Syringe 27G X 1/2" 1 ML MISC 1 Units by Does not apply route daily. For insulin dependent type 2 diabetes 100 each 3   methadone (DOLOPHINE) 10 MG tablet Limit 5-7 tabs by mouth per day if tolerated (Patient taking differently: Take 10 mg by mouth. Limit 5-7 tabs by mouth per day if tolerated) 210 tablet 0   NOVOLOG FLEXPEN 100 UNIT/ML FlexPen INJECT 0-20 UNITS 3 TIMES A DAY BEFORE MEALS PER SLIDING SCALE PER MD 15 mL 11   oxyCODONE (OXY IR/ROXICODONE) 5 MG immediate release tablet Limit 4 - 6 tabs po / day for breakthrough pain while taking methadone if tolerated 180 tablet 0   Blood Glucose Monitoring Suppl (ONE TOUCH ULTRA MINI) w/Device KIT Use to check blood sugar once a day, Dm2 E11.65 1 each 0   insulin glargine (LANTUS) 100 UNIT/ML injection INJECT 100 UNITS SUBCUTANEOUSLY TWICE DAILY 30 mL 0   cefdinir (OMNICEF) 300 MG  capsule Take 1 capsule (300 mg total) by mouth 2 (two) times daily. (Patient not taking: Reported on 05/06/2021) 20 capsule 0   methocarbamol (ROBAXIN) 500 MG tablet Take 1 tablet (500 mg total) by mouth every 8 (eight) hours as needed. (Patient not taking: Reported on 05/06/2021) 12 tablet 0   NARCAN 4 MG/0.1ML LIQD nasal spray kit  (Patient not taking: Reported on 05/06/2021)     pioglitazone (ACTOS) 15 MG tablet Take 1 tablet (15 mg total) by mouth daily. (Patient not taking: Reported on 05/06/2021) 30 tablet 1   canagliflozin (INVOKANA) 100 MG TABS tablet Take 1 tablet (100 mg total) by mouth daily before breakfast. (Patient not taking: Reported on 05/06/2021) 30 tablet 1   No facility-administered medications prior to visit.    Allergies  Allergen Reactions   Meloxicam Itching, Swelling and Nausea And Vomiting   Sulfa Antibiotics Other (See Comments)    ROS Review of Systems  Respiratory:  Positive for cough and wheezing.   Cardiovascular:  Negative for chest pain, palpitations and leg swelling.  Gastrointestinal:  Positive for nausea. Negative for diarrhea and vomiting.  Endocrine: Positive for polydipsia, polyphagia and polyuria.  Genitourinary:  Negative for dysuria and flank pain.  Musculoskeletal:  Negative for arthralgias.  Skin:  Negative for rash.  Neurological:  Negative for dizziness, syncope, weakness and headaches.     Objective:    Physical Exam Constitutional:      Appearance: Normal appearance.  HENT:     Head: Normocephalic.  Eyes:     General: Lids are normal.     Conjunctiva/sclera: Conjunctivae normal.     Pupils: Pupils are equal, round, and reactive to light.  Cardiovascular:     Rate and Rhythm: Normal rate and regular rhythm.     Heart sounds: Normal heart sounds.  Pulmonary:     Effort: Pulmonary effort is normal.     Breath sounds: Decreased air movement present. Decreased breath sounds present.  Abdominal:     General: Bowel sounds are normal.      Palpations: Abdomen is soft.  Musculoskeletal:     Right lower leg: No edema.     Left lower leg: No edema.  Feet:     Right foot:     Skin integrity: Callus present.     Toenail Condition: Right toenails are normal.     Left foot:  Skin integrity: Callus present.     Toenail Condition: Left toenails are normal.  Lymphadenopathy:     Head:     Right side of head: No submental or submandibular adenopathy.     Left side of head: No submental or submandibular adenopathy.     Upper Body:     Right upper body: No supraclavicular adenopathy.     Left upper body: No supraclavicular adenopathy.  Neurological:     Mental Status: She is alert.    BP 140/68 (BP Location: Right Arm, Patient Position: Sitting, Cuff Size: Normal)   Pulse 88   Temp 98.6 F (37 C) (Oral)   Wt 191 lb (86.6 kg)   SpO2 96%   BMI 31.78 kg/m  Wt Readings from Last 3 Encounters:  05/06/21 191 lb (86.6 kg)  02/26/21 200 lb (90.7 kg)  06/23/20 199 lb (90.3 kg)     Health Maintenance Due  Topic Date Due   COVID-19 Vaccine (1) Never done   Pneumococcal Vaccine 28-15 Years old (1 - PCV) Never done   OPHTHALMOLOGY EXAM  Never done   PAP SMEAR-Modifier  Never done   COLONOSCOPY (Pts 45-17yr Insurance coverage will need to be confirmed)  Never done   MAMMOGRAM  Never done   Zoster Vaccines- Shingrix (1 of 2) Never done   URINE MICROALBUMIN  09/29/2018    There are no preventive care reminders to display for this patient.  Lab Results  Component Value Date   TSH 1.770 05/05/2019   Lab Results  Component Value Date   WBC 9.9 06/23/2020   HGB 15.5 (H) 06/23/2020   HCT 47.2 (H) 06/23/2020   MCV 87.4 06/23/2020   PLT 164 06/23/2020   Lab Results  Component Value Date   NA 135 06/23/2020   K 5.0 06/23/2020   CO2 32 06/23/2020   GLUCOSE 212 (H) 06/23/2020   BUN 13 06/23/2020   CREATININE 0.68 06/23/2020   BILITOT 0.3 05/05/2019   ALKPHOS 73 05/05/2019   AST 11 05/05/2019   ALT 8 05/05/2019    PROT 6.9 05/05/2019   ALBUMIN 3.7 (L) 05/05/2019   CALCIUM 8.9 06/23/2020   ANIONGAP 7 06/23/2020   Lab Results  Component Value Date   CHOL 160 05/05/2019   Lab Results  Component Value Date   HDL 34 (L) 05/05/2019   Lab Results  Component Value Date   LDLCALC 81 05/05/2019   Lab Results  Component Value Date   TRIG 276 (H) 05/05/2019   Lab Results  Component Value Date   CHOLHDL 4.7 (H) 05/05/2019   Lab Results  Component Value Date   HGBA1C 11.8 (A) 05/06/2021      Assessment & Plan:    Problem List Items Addressed This Visit       Endocrine   Type 2 diabetes mellitus with hyperglycemia, with long-term current use of insulin ([place] [facility]    Patient presents today for  Diabetes management and concern for medication compliance as she has been out of insulin and testing strips for several days due to cost.  A1c is 11.8% today via POCT Labs ordered today include: CMP, CBC, Urine microalbumin, and lipid panel as patient has not been seen for this in approx 2 years lantus 80 units BID and Invokana refill ordered today  Patient instructed to follow up in 3 months or sooner according to return precautions.       Relevant Medications   Blood Glucose Monitoring Suppl (ONE TOUCH ULTRA  MINI) w/Device KIT   insulin glargine (LANTUS) 100 UNIT/ML injection   canagliflozin (INVOKANA) 100 MG TABS tablet   Other Relevant Orders   POCT HgB A1C (Completed)   Lipid panel   Comprehensive Metabolic Panel (CMET)   CBC w/Diff/Platelet   AMB Referral to Community Care Coordinaton   Urine Microalbumin w/creat. ratio     Other   Current tobacco use    Patient states she is currently still smoking but has cut back due to cost of cigarettes Patient is amenable to cessation discussion Will discuss at follow up .       Other Visit Diagnoses     Type 2 diabetes mellitus with diabetic neuropathy, with long-term current use of insulin ([facility])       Relevant Medications    insulin glargine (LANTUS) 100 UNIT/ML injection   canagliflozin (INVOKANA) 100 MG TABS tablet        Return in about 3 months (around 08/04/2021) for Diabetes management.   I, Lasharon Dunivan E Armina Galloway, [name], have reviewed all documentation for this visit. The documentation on 05/06/21 for the exam, diagnosis, procedures, and orders are all accurate and complete.   Zarinah Oviatt, Dani Gobble, [name] MPH [facility]   Meds ordered this encounter  Medications   Blood Glucose Monitoring Suppl (ONE TOUCH ULTRA MINI) w/Device KIT    Sig: Use to check blood sugar once a day, Dm2 E11.65    Dispense:  1 kit    Refill:  0   insulin glargine (LANTUS) 100 UNIT/ML injection    Sig: Inject 0.8 mLs (80 Units total) into the skin 2 (two) times daily. INJECT 100 UNITS SUBCUTANEOUSLY TWICE DAILY    Dispense:  3 mL    Refill:  2    PATIENT NEEDS TO SCHEDULE OFFICE VISIT FOR FOLLOW UP   canagliflozin (INVOKANA) 100 MG TABS tablet    Sig: Take 1 tablet (100 mg total) by mouth daily before breakfast.    Dispense:  30 tablet    Refill:  1     Follow-up: Return in about 3 months (around 08/04/2021) for Diabetes management.    Becker Christopher E Kiaja Shorty, [name]

## 2021-05-06 NOTE — Assessment & Plan Note (Addendum)
Patient presents today for  Diabetes management and concern for medication compliance as she has been out of insulin and testing strips for several days due to cost.  A1c is 11.8% today via POCT Labs ordered today include: CMP, CBC, Urine microalbumin, and lipid panel as patient has not been seen for this in approx 2 years lantus 80 units BID and Invokana refill ordered today  Patient instructed to follow up in 3 months or sooner according to return precautions.

## 2021-05-06 NOTE — Patient Instructions (Addendum)
Can use [employer]rx[employer] to help with paying for your medications.   Please follow up with [facility] management to assist with medication management and payment help. They will be expecting you within the next 7 days.

## 2021-05-06 NOTE — Telephone Encounter (Signed)
Thank you :)

## 2021-05-06 NOTE — Telephone Encounter (Signed)
Please clarify

## 2021-05-06 NOTE — Assessment & Plan Note (Signed)
Patient states she is currently still smoking but has cut back due to cost of cigarettes Patient is amenable to cessation discussion Will discuss at follow up .

## 2021-05-06 NOTE — Telephone Encounter (Signed)
Resent rx

## 2021-05-06 NOTE — Telephone Encounter (Signed)
Katie calling from [facility] is calling for clarification of insulin glargine (LANTUS) 100 UNIT/ML injection [[id number]] There are 2 sets of instructions. Needing to know which one is correct. CB- [phone]

## 2021-05-09 ENCOUNTER — Telehealth: Payer: Self-pay | Admitting: *Deleted

## 2021-05-09 NOTE — Chronic Care Management (AMB) (Signed)
  Chronic Care Management   Outreach Note  05/09/2021 Name: LISSETH BRAZEAU MRN: [id number] DOB: 1960-07-17  Amy Meyers is a 60 y.o. year old female who is a primary care patient of Fisher, Kirstie Peri, MD. I reached out to Amy Meyers by phone today in response to a referral sent by Ms. Billey Gosling Trahan's primary care provider.  An unsuccessful telephone outreach was attempted today. The patient was referred to the case management team for assistance with care management and care coordination.   Follow Up Plan: A HIPAA compliant phone message was left for the patient providing contact information and requesting a return call.  If patient returns call to provider office, please advise to call Embedded Care Management Care Guide Brittania Sudbeck at [facility] [place], [place] Management  Direct Dial: [phone]

## 2021-05-11 ENCOUNTER — Telehealth: Payer: Self-pay

## 2021-05-11 NOTE — Telephone Encounter (Signed)
-----   Message from Almon Register, [name] sent at 05/11/2021  8:44 AM EST ----- Can we call this patient and make sure she gets these labs completed? She has not had labwork performed in a long time and she has A1c >10% so she will need to be monitored more strictly until we can improve these results. Thank you!   ----- Message ----- From: SYSTEM Sent: 05/11/2021  12:18 AM EST To: Dani Gobble Mecum, [name]

## 2021-05-11 NOTE — Telephone Encounter (Signed)
Na, unable LM. If patient calls back ok for [facility] nurse to give message to patient.

## 2021-05-12 NOTE — Chronic Care Management (AMB) (Signed)
°  Chronic Care Management   Outreach Note  05/12/2021 Name: [name] MRN: [id number] DOB: 28-Nov-1960  Amy Meyers is a 60 y.o. year old female who is a primary care patient of Fisher, Kirstie Peri, MD. I reached out to Amy Meyers by phone today in response to a referral sent by Ms. Billey Gosling Bennis's primary care provider.  A second unsuccessful telephone outreach was attempted today. The patient was referred to the case management team for assistance with care management and care coordination.   Follow Up Plan: If patient returns call to provider office, please advise to call Embedded Care Management Care Guide Orvan Papadakis at [place], [place] Management  Direct Dial: [phone]

## 2021-05-13 NOTE — Progress Notes (Signed)
LMOVM for pt to return call. Okay for pec to advise patient. Thanks!

## 2021-05-16 NOTE — Chronic Care Management (AMB) (Signed)
°  Chronic Care Management   Outreach Note  05/16/2021 Name: Amy Meyers MRN: [id number] DOB: 09-27-1960  Amy Meyers is a 60 y.o. year old female who is a primary care patient of Fisher, Kirstie Peri, MD. I reached out to Amy Meyers by phone today in response to a referral sent by Ms. Billey Gosling Jaggers's primary care provider.  Third unsuccessful telephone outreach was attempted today. The patient was referred to the case management team for assistance with care management and care coordination. The patient's primary care provider has been notified of our unsuccessful attempts to make or maintain contact with the patient. The care management team is pleased to engage with this patient at any time in the future should he/she be interested in assistance from the care management team.   Follow Up Plan: We have been unable to make contact with the patient for follow up. The care management team is available to follow up with the patient after provider conversation with the patient regarding recommendation for care management engagement and subsequent re-referral to the care management team.   Julian Hy, [place] Management  Direct Dial: [phone]

## 2021-05-26 ENCOUNTER — Telehealth: Payer: Self-pay

## 2021-05-26 NOTE — Telephone Encounter (Signed)
See other telephone  encounters.

## 2021-05-26 NOTE — Telephone Encounter (Signed)
We do not have samples of Novolog or Lantus. Tried calling patient's daughter. Left message to call back. If Ms Glover call back please advise them of Erin's message below regarding the  Referral to CCM to assist with cost barriers. There is an encounter from 05/09/2021 with information for patient to reach out to [place] at [phone]. OK for [facility] triage to advise.

## 2021-05-26 NOTE — Telephone Encounter (Signed)
It doesn't look like patient completed blood work that was ordered during last office visit with Talitha Givens, [name] on 05/06/2021. I tried calling patient's daughter Alona Danford, who has same name as patient. Left message to call back. Please advise on initial request for samples.

## 2021-05-26 NOTE — Telephone Encounter (Signed)
Patient daughter returned my call. I advised her of all messages below. She says that she is going to give Staci a call regarding medication cost assistance.

## 2021-05-26 NOTE — Telephone Encounter (Signed)
Patient 's daughter was advised to have blood work done tomorrow and then to come to the front to see about the insulin samples.

## 2021-05-26 NOTE — Telephone Encounter (Signed)
PEC sent the following message in patient's daughters chart in error:  May 26, 2021 [place], [place] to [facility] Nurse      9:50 AM  Please advise.   Gwyneth Sprout, FNP to [facility] Nurse      9:38 AM I don't see dx of DM; or that she had previous insulin rx.   May 25, 2021 Beverlee Nims, CMA to Gwyneth Sprout, FNP      2:57 PM Please advise        1:44 PM [place], Tawanna Solo, CMA routed this conversation to [facility] Nurse  Juluis Mire, CMA      1:44 PM Note Copied from [place] [phone]. Topic: Quick Communication - Rx Refill/Question >> May 25, 2021  1:22 PM Erick Blinks wrote: Pt called to report that the patient needs samples, they cannot afford her insulin this week. Seeking samples from the office.    [phone]

## 2021-05-27 DIAGNOSIS — Z794 Long term (current) use of insulin: Secondary | ICD-10-CM | POA: Diagnosis not present

## 2021-05-27 DIAGNOSIS — E1140 Type 2 diabetes mellitus with diabetic neuropathy, unspecified: Secondary | ICD-10-CM | POA: Diagnosis not present

## 2021-05-28 LAB — CBC WITH DIFFERENTIAL/PLATELET
Basophils Absolute: 0.1 10*3/uL (ref 0.0–0.2)
Basos: 1 %
EOS (ABSOLUTE): 0.3 10*3/uL (ref 0.0–0.4)
Eos: 3 %
Hematocrit: 44.7 % (ref 34.0–46.6)
Hemoglobin: 15.1 g/dL (ref 11.1–15.9)
Immature Grans (Abs): 0 10*3/uL (ref 0.0–0.1)
Immature Granulocytes: 0 %
Lymphocytes Absolute: 3.6 10*3/uL — ABNORMAL HIGH (ref 0.7–3.1)
Lymphs: 32 %
MCH: 28.3 pg (ref 26.6–33.0)
MCHC: 33.8 g/dL (ref 31.5–35.7)
MCV: 84 fL (ref 79–97)
Monocytes Absolute: 0.7 10*3/uL (ref 0.1–0.9)
Monocytes: 6 %
Neutrophils Absolute: 6.7 10*3/uL (ref 1.4–7.0)
Neutrophils: 58 %
Platelets: 192 10*3/uL (ref 150–450)
RBC: 5.34 x10E6/uL — ABNORMAL HIGH (ref 3.77–5.28)
RDW: 13.5 % (ref 11.7–15.4)
WBC: 11.4 10*3/uL — ABNORMAL HIGH (ref 3.4–10.8)

## 2021-05-28 LAB — MICROALBUMIN / CREATININE URINE RATIO
Creatinine, Urine: 121.5 mg/dL
Microalb/Creat Ratio: 547 mg/g creat — ABNORMAL HIGH (ref 0–29)
Microalbumin, Urine: 664 ug/mL

## 2021-05-28 LAB — COMPREHENSIVE METABOLIC PANEL
ALT: 12 IU/L (ref 0–32)
AST: 11 IU/L (ref 0–40)
Albumin/Globulin Ratio: 1.3 (ref 1.2–2.2)
Albumin: 4 g/dL (ref 3.8–4.8)
Alkaline Phosphatase: 84 IU/L (ref 44–121)
BUN/Creatinine Ratio: 14 (ref 12–28)
BUN: 11 mg/dL (ref 8–27)
Bilirubin Total: 0.5 mg/dL (ref 0.0–1.2)
CO2: 29 mmol/L (ref 20–29)
Calcium: 9.1 mg/dL (ref 8.7–10.3)
Chloride: 100 mmol/L (ref 96–106)
Creatinine, Ser: 0.8 mg/dL (ref 0.57–1.00)
Globulin, Total: 3.1 g/dL (ref 1.5–4.5)
Glucose: 223 mg/dL — ABNORMAL HIGH (ref 70–99)
Potassium: 4.3 mmol/L (ref 3.5–5.2)
Sodium: 142 mmol/L (ref 134–144)
Total Protein: 7.1 g/dL (ref 6.0–8.5)
eGFR: 83 mL/min/{1.73_m2} (ref >59)

## 2021-05-28 LAB — LIPID PANEL
Chol/HDL Ratio: 5.9 ratio — ABNORMAL HIGH (ref 0.0–4.4)
Cholesterol, Total: 176 mg/dL (ref 100–199)
HDL: 30 mg/dL — ABNORMAL LOW (ref >39)
LDL Chol Calc (NIH): 118 mg/dL — ABNORMAL HIGH (ref 0–99)
Triglycerides: 157 mg/dL — ABNORMAL HIGH (ref 0–149)
VLDL Cholesterol Cal: 28 mg/dL (ref 5–40)

## 2021-05-31 NOTE — Chronic Care Management (AMB) (Signed)
Chronic Care Management   Note  05/31/2021 Name: MADOLIN TWADDLE MRN: [id number] DOB: 30-May-1960  Ebbie Ridge is a 61 y.o. year old female who is a primary care patient of Fisher, Kirstie Peri, MD. I reached out to Ebbie Ridge by phone today in response to a referral sent by Ms. Noreene H Loyd's PCP.  Ms. Seubert was given information about Chronic Care Management services today including:  CCM service includes personalized support from designated clinical staff supervised by her physician, including individualized plan of care and coordination with other care providers 24/7 contact phone numbers for assistance for urgent and routine care needs. Service will only be billed when office clinical staff spend 20 minutes or more in a month to coordinate care. Only one practitioner may furnish and bill the service in a calendar month. The patient may stop CCM services at any time (effective at the end of the month) by phone call to the office staff. The patient is responsible for co-pay (up to 20% after annual deductible is met) if co-pay is required by the individual health plan.   Patient agreed to services and verbal consent obtained.   Follow up plan: Telephone appointment with care management team member scheduled for: 06/07/2021  Julian Hy, [place] Management  Direct Dial: [phone]

## 2021-06-02 ENCOUNTER — Telehealth: Payer: Self-pay

## 2021-06-02 NOTE — Progress Notes (Signed)
Chronic Care Management Pharmacy Assistant   Name: KHRISTIN KELEHER  MRN: [id number] DOB: 08/01/1960  Chart Review for clinical pharmacist on 06/07/2021 at 9:15 am.  Conditions to be addressed/monitored: HTN, DMII, Anxiety, and NAFLD, Neuropathy, DDD, DJD, Arthritis, Facet syndrome, Headache  Primary concerns for visit include: None ID   Recent office visits:  05/06/2021 Junie Panning Mecum [name] (PCP Office) Change Lantus to 0.8 mLs (80 units total) into the skin 2 times daily, AMB Referral to [facility] Coordination,follow up in 3 months   Recent consult visits:  No recent consult visit  Hospital visits:  Medication Reconciliation was completed by comparing discharge summary, patients EMR and Pharmacy list, and upon discussion with patient.  Admitted to the hospital on 02/26/2021 due to Chest wall pain. Discharge date was 02/26/2021. Discharged from [place]?Medications Started at [facility] Discharge:?? -started Tylenol every 6 hours as needed for discomfort.   Medication Changes at Hospital Discharge: -Changed None  Medications Discontinued at Hospital Discharge: -Stopped None  Medications that remain the same after Hospital Discharge:??  -All other medications will remain the same.    Left Voice message to do initial question prior to patient appointment on 06/07/2021 for CCM at 9:15 am with Junius Argyle the Clinical pharmacist.    Medications: Outpatient Encounter Medications as of 06/02/2021  Medication Sig   amphetamine-dextroamphetamine (ADDERALL) 20 MG tablet    aspirin 325 MG tablet Take 325 mg by mouth daily. Reported on 07/30/2015   Blood Glucose Monitoring Suppl (ONE TOUCH ULTRA MINI) w/Device KIT Use to check blood sugar once a day, Dm2 E11.65   canagliflozin (INVOKANA) 100 MG TABS tablet Take 1 tablet (100 mg total) by mouth daily before breakfast.   cefdinir (OMNICEF) 300 MG capsule Take 1 capsule (300 mg total) by mouth 2 (two) times daily.  (Patient not taking: Reported on 05/06/2021)   gabapentin (NEURONTIN) 300 MG capsule    insulin glargine (LANTUS) 100 UNIT/ML injection Inject 0.8 mLs (80 Units total) into the skin 2 (two) times daily.   Insulin Syringe 27G X 1/2" 1 ML MISC 1 Units by Does not apply route daily. For insulin dependent type 2 diabetes   methadone (DOLOPHINE) 10 MG tablet Limit 5-7 tabs by mouth per day if tolerated (Patient taking differently: Take 10 mg by mouth. Limit 5-7 tabs by mouth per day if tolerated)   methocarbamol (ROBAXIN) 500 MG tablet Take 1 tablet (500 mg total) by mouth every 8 (eight) hours as needed. (Patient not taking: Reported on 05/06/2021)   NARCAN 4 MG/0.1ML LIQD nasal spray kit  (Patient not taking: Reported on 05/06/2021)   NOVOLOG FLEXPEN 100 UNIT/ML FlexPen INJECT 0-20 UNITS 3 TIMES A DAY BEFORE MEALS PER SLIDING SCALE PER MD   oxyCODONE (OXY IR/ROXICODONE) 5 MG immediate release tablet Limit 4 - 6 tabs po / day for breakthrough pain while taking methadone if tolerated   pioglitazone (ACTOS) 15 MG tablet Take 1 tablet (15 mg total) by mouth daily. (Patient not taking: Reported on 05/06/2021)   No facility-administered encounter medications on file as of 06/02/2021.    Care Gaps: COVID 19 Vaccine Pneumococcal Vaccine Ophthalmology Exam  PAP smear- Modifier Colonoscopy Mammogram Shingrix Vaccine A1C was 11.8% on 05/06/2021 HTN: was 140/68 on 05/06/2021  Star Rating Drugs: Invokana 100 mg last filled 05/26/2019 for 30 day supply at [facility]. Pioglitazone 15 mg not on fill report  Medication Fill Gaps: Lantus 100 UNIT/ML injection last filled 02/15/2021 for  5 day supply. Novolog  FLEXPEN 100 UNIT/ML last filled 11/04/2020 for 25 day supply.  [place] Pharmacist Assistant [phone]

## 2021-06-07 ENCOUNTER — Ambulatory Visit (INDEPENDENT_AMBULATORY_CARE_PROVIDER_SITE_OTHER): Payer: Medicare Other

## 2021-06-07 DIAGNOSIS — E785 Hyperlipidemia, unspecified: Secondary | ICD-10-CM

## 2021-06-07 DIAGNOSIS — E1165 Type 2 diabetes mellitus with hyperglycemia: Secondary | ICD-10-CM

## 2021-06-07 DIAGNOSIS — I10 Essential (primary) hypertension: Secondary | ICD-10-CM

## 2021-06-07 DIAGNOSIS — Z794 Long term (current) use of insulin: Secondary | ICD-10-CM

## 2021-06-07 DIAGNOSIS — E1169 Type 2 diabetes mellitus with other specified complication: Secondary | ICD-10-CM

## 2021-06-07 NOTE — Progress Notes (Signed)
Chronic Care Management Pharmacy Note  06/13/2021 Name:  BRIEANNA NAU MRN:  [id number] DOB:  06-21-1960  Summary: Patient presents for initial CCM consult. Unfortunately our phone call disconnected partway through our conversation so we were unable to fully reconcile medications. She is back on Lantus, but admits financial challenges with other diabetes medications.   Recommendations/Changes made from today's visit: -SWITCH Lantus to [place] given high insulin burden  -SWITCH Invokana to [place] given CKD data and likelihood of PAP approval.  -PAP for Farxiga, Tresiba, and Novolog  -Recommend rosuvastatin 20 mg daily at next follow-up.  -Recommend starting Olmesartan 20 mg daily at next follow-up.   Plan: CPP follow-up 1 month   Subjective: JYOTI HARJU is an 61 y.o. year old female who is a primary patient of Fisher, Kirstie Peri, MD.  The CCM team was consulted for assistance with disease management and care coordination needs.    Engaged with patient by telephone for initial visit in response to provider referral for pharmacy case management and/or care coordination services.   Consent to Services:  The patient was given the following information about Chronic Care Management services today, agreed to services, and gave verbal consent: 1. CCM service includes personalized support from designated clinical staff supervised by the primary care provider, including individualized plan of care and coordination with other care providers 2. 24/7 contact phone numbers for assistance for urgent and routine care needs. 3. Service will only be billed when office clinical staff spend 20 minutes or more in a month to coordinate care. 4. Only one practitioner may furnish and bill the service in a calendar month. 5.The patient may stop CCM services at any time (effective at the end of the month) by phone call to the office staff. 6. The patient will be responsible for cost sharing (co-pay) of up to 20% of the  service fee (after annual deductible is met). Patient agreed to services and consent obtained.  Patient Care Team: Birdie Sons, MD as PCP - General (Family Medicine) Mohammed Kindle, MD as Attending Physician (Pain Medicine) Samara Deist, DPM as Referring Physician (Podiatry) Germaine Pomfret, [facility] (Pharmacist)  Recent office visits: 05/06/2021 Talitha Givens [name] (PCP Office) Change Lantus to 0.8 mLs (80 units total) into the skin 2 times daily, AMB Referral to [facility] Coordination,follow up in 3 months   Recent consult visits: No recent consult visit  Hospital visits:  Admitted to the hospital on 02/26/2021 due to Chest wall pain. Discharge date was 02/26/2021. Discharged from [place]?Medications Started at [facility] Discharge:?? -started Tylenol every 6 hours as needed for discomfort.    Medication Changes at Hospital Discharge: -Changed None   Medications Discontinued at Hospital Discharge: -Stopped None   Medications that remain the same after Hospital Discharge:??  -All other medications will remain the same.    Objective:  Lab Results  Component Value Date   CREATININE 0.80 05/27/2021   BUN 11 05/27/2021   GFRNONAA >60 06/23/2020   GFRAA 100 05/05/2019   NA 142 05/27/2021   K 4.3 05/27/2021   CALCIUM 9.1 05/27/2021   CO2 29 05/27/2021   GLUCOSE 223 (H) 05/27/2021    Lab Results  Component Value Date/Time   HGBA1C 11.8 (A) 05/06/2021 11:49 AM   HGBA1C 8.7 (H) 05/05/2019 03:48 PM   HGBA1C 8.4 09/28/2017 12:07 PM   HGBA1C 6.7 (A) 01/28/2014 12:00 AM   MICROALBUR 100 09/28/2017 12:07 PM    Last diabetic  Eye exam: No results found for: HMDIABEYEEXA  Last diabetic Foot exam: No results found for: HMDIABFOOTEX   Lab Results  Component Value Date   CHOL 176 05/27/2021   HDL 30 (L) 05/27/2021   LDLCALC 118 (H) 05/27/2021   TRIG 157 (H) 05/27/2021   CHOLHDL 5.9 (H) 05/27/2021    Hepatic Function Latest Ref Rng &  Units 05/27/2021 05/05/2019 09/28/2017  Total Protein 6.0 - 8.5 g/dL 7.1 6.9 7.3  Albumin 3.8 - 4.8 g/dL 4.0 3.7(L) 4.0  AST 0 - 40 IU/L '11 11 16  ' ALT 0 - 32 IU/L '12 8 19  ' Alk Phosphatase 44 - 121 IU/L 84 73 73  Total Bilirubin 0.0 - 1.2 mg/dL 0.5 0.3 0.4    Lab Results  Component Value Date/Time   TSH 1.770 05/05/2019 03:48 PM    CBC Latest Ref Rng & Units 05/27/2021 06/23/2020 09/28/2017  WBC 3.4 - 10.8 x10E3/uL 11.4(H) 9.9 10.7  Hemoglobin 11.1 - 15.9 g/dL 15.1 15.5(H) 15.1  Hematocrit 34.0 - 46.6 % 44.7 47.2(H) 45.1  Platelets 150 - 450 x10E3/uL 192 164 176    No results found for: VD25OH  Clinical ASCVD: No  The 10-year ASCVD risk score (Arnett DK, et al., 2019) is: 29%   Values used to calculate the score:     Age: 61 years     Sex: Female     Is Non-Hispanic African American: No     Diabetic: Yes     Tobacco smoker: Yes     Systolic Blood Pressure: 161 mmHg     Is BP treated: Yes     HDL Cholesterol: 30 mg/dL     Total Cholesterol: 176 mg/dL    Depression screen [facility] 2/9 05/05/2019 06/23/2016 01/13/2016  Decreased Interest 3 0 0  Down, Depressed, Hopeless 3 3 0  PHQ - 2 Score 6 3 0  Altered sleeping 1 1 -  Tired, decreased energy 3 3 -  Change in appetite 3 1 -  Feeling bad or failure about yourself  3 3 -  Trouble concentrating 3 0 -  Moving slowly or fidgety/restless 1 0 -  Suicidal thoughts 1 1 -  PHQ-9 Score 21 12 -  Difficult doing work/chores Very difficult Somewhat difficult -    Social History   Tobacco Use  Smoking Status Every Day   Packs/day: 1.00   Years: 44.00   Pack years: 44.00   Types: Cigarettes  Smokeless Tobacco Former   Types: Chew   BP Readings from Last 3 Encounters:  05/06/21 140/68  06/23/20 (!) 135/46  02/29/20 131/68   Pulse Readings from Last 3 Encounters:  05/06/21 88  02/26/21 82  06/23/20 73   Wt Readings from Last 3 Encounters:  05/06/21 191 lb (86.6 kg)  02/26/21 200 lb (90.7 kg)  06/23/20 199 lb (90.3 kg)   BMI  Readings from Last 3 Encounters:  05/06/21 31.78 kg/m  02/26/21 33.28 kg/m  06/23/20 32.12 kg/m    Assessment/Interventions: Review of patient past medical history, allergies, medications, health status, including review of consultants reports, laboratory and other test data, was performed as part of comprehensive evaluation and provision of chronic care management services.   SDOH:  (Social Determinants of Health) assessments and interventions performed: Yes SDOH Interventions    Flowsheet Row Most Recent Value  SDOH Interventions   Financial Strain Interventions Other (Comment)  [PAP]      SDOH Screenings   Alcohol Screen: Not on file  Depression (WRU0-4): Not on  Designer, industrial/product Strain: High Risk   Difficulty of Paying Living Expenses: Very hard  Food Insecurity: Not on file  Housing: Not on file  Physical Activity: Not on file  Social Connections: Not on file  Stress: Not on file  Tobacco Use: High Risk   Smoking Tobacco Use: Every Day   Smokeless Tobacco Use: Former   Passive Exposure: Not on file  Transportation Needs: Not on file    [place]  Allergies  Allergen Reactions   Meloxicam Itching, Swelling and Nausea And Vomiting   Sulfa Antibiotics Other (See Comments)    Medications Reviewed Today     Reviewed by Germaine Pomfret, Winifred (Pharmacist) on 06/13/21 at [place] List Status: <None>   Medication Order Taking? Sig Documenting Provider Last Dose Status Informant  amphetamine-dextroamphetamine (ADDERALL) 20 MG tablet [id number]   [provider]  Active   aspirin 325 MG tablet [id number]  Take 325 mg by mouth daily. Reported on 07/30/2015 [provider]  Active Self  Blood Glucose Monitoring Suppl (ONE TOUCH ULTRA MINI) w/Device KIT [id number]  Use to check blood sugar once a day, Dm2 E11.65 Mecum, Erin E, [name]  Active   canagliflozin (INVOKANA) 100 MG TABS tablet [id number] No Take 1 tablet (100 mg total) by mouth daily  before breakfast.  Patient not taking: Reported on 06/13/2021   Mecum, Dani Gobble, [name] Not Taking Active   cefdinir (OMNICEF) 300 MG capsule [id number]  Take 1 capsule (300 mg total) by mouth 2 (two) times daily.  Patient not taking: Reported on 05/06/2021   Versie Starks, [name]  Active   gabapentin (NEURONTIN) 300 MG capsule [id number]   [provider]  Active   insulin glargine (LANTUS) 100 UNIT/ML injection [id number] Yes Inject 0.8 mLs (80 Units total) into the skin 2 (two) times daily. Mecum, Erin E, [name] Taking Active   Insulin Syringe 27G X 1/2" 1 ML MISC [id number]  1 Units by Does not apply route daily. For insulin dependent type 2 diabetes Birdie Sons, MD  Active   methadone (DOLOPHINE) 10 MG tablet [id number]  Limit 5-7 tabs by mouth per day if tolerated  Patient taking differently: Take 10 mg by mouth. Limit 5-7 tabs by mouth per day if tolerated   Mohammed Kindle, MD  Active   methocarbamol (ROBAXIN) 500 MG tablet [id number]  Take 1 tablet (500 mg total) by mouth every 8 (eight) hours as needed.  Patient not taking: Reported on 05/06/2021   Johnn Hai, [name]  Active   NARCAN 4 MG/0.1ML LIQD nasal spray kit [id number]    Patient not taking: Reported on 05/06/2021   [provider]  Active   NOVOLOG FLEXPEN 100 UNIT/ML FlexPen [id number] No INJECT 0-20 UNITS 3 TIMES A DAY BEFORE MEALS PER SLIDING SCALE PER MD  Patient not taking: Reported on 06/07/2021   Birdie Sons, MD Not Taking Active   oxyCODONE (OXY IR/ROXICODONE) 5 MG immediate release tablet [id number]  Limit 4 - 6 tabs po / day for breakthrough pain while taking methadone if tolerated Mohammed Kindle, MD  Active   pioglitazone (ACTOS) 15 MG tablet [id number] No Take 1 tablet (15 mg total) by mouth daily.  Patient not taking: Reported on 05/06/2021   Birdie Sons, MD Not Taking Active   Med List Note Hart Rochester, RN 01/13/16 0820): UDS  09/16/15 MR 02/23/16 CS 01/12/2015  Patient Active Problem List   Diagnosis Date Noted   Hyperlipidemia associated with type 2 diabetes mellitus ([place]) 06/13/2021   Type 2 diabetes mellitus with hyperglycemia, with long-term current use of insulin ([place]) 05/06/2021   Proteinuria 12/27/2016   Callous ulcer ([place]) 05/11/2015   Anxiety 04/13/2015   Arthritis, degenerative 04/06/2015   Neuropathy 04/06/2015   Back pain, chronic 04/06/2015   Abnormal ECG 03/17/2015   Hypertension 03/17/2015   NAFLD (nonalcoholic fatty liver disease) 03/17/2015   Morbid obesity ([place]) 03/17/2015   Palpitations 03/17/2015   Pulmonary edema 03/17/2015   PVC's (premature ventricular contractions) 03/17/2015   Breath shortness 03/17/2015   Headache, tension-type 03/17/2015   Type 2 diabetes mellitus with right diabetic foot ulcer ([place]) 01/06/2015   Facet syndrome, lumbar 11/26/2014   Sacroiliac joint dysfunction 11/26/2014   DDD (degenerative disc disease), thoracolumbar 09/29/2014   Diabetes mellitus due to underlying condition with diabetic neuropathy ([place]) 09/29/2014   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) 09/29/2014   Osteomyelitis of lower leg ([place]) 03/11/2014   Current tobacco use 12/19/2013   Systemic inflammatory response syndrome (SIRS) ([place]) 12/16/2013   Arthritis 12/16/2013    Immunization History  Administered Date(s) Administered   Influenza,inj,Quad PF,6+ Mos 05/05/2019    Conditions to be addressed/monitored:  Hypertension, Hyperlipidemia, Diabetes, Anxiety, and ADHD, Chronic Pain  Care Plan : General Pharmacy (Adult)  Updates made by Germaine Pomfret, RPH since 06/13/2021 12:00 AM     Problem: Hypertension, Hyperlipidemia, Diabetes, Anxiety, and ADHD, Chronic Pain   Priority: High     Long-Range Goal: Patient-Specific Goal   Start Date: 06/13/2021  Expected End Date: 06/13/2022  This Visit's Progress: On track  Priority: High  Note:   Current Barriers:  Unable to  independently afford treatment regimen Unable to achieve control of diabetes  Suboptimal therapeutic regimen for HTN, HLD  Pharmacist Clinical Goal(s):  Patient will verbalize ability to afford treatment regimen achieve control of Diabetes as evidenced by A1c less than 7% adhere to plan to optimize therapeutic regimen for HTN, HLD as evidenced by report of adherence to recommended medication management changes through collaboration with PharmD and provider.   Interventions: 1:1 collaboration with Birdie Sons, MD regarding development and update of comprehensive plan of care as evidenced by provider attestation and co-signature Inter-disciplinary care team collaboration (see longitudinal plan of care) Comprehensive medication review performed; medication list updated in electronic medical record  Hypertension (BP goal <130/80) -Uncontrolled -Current treatment: None -Medications previously tried: Lisinopril, Carvedilol   -Current home readings: Does not monitor at home.  -Would recommend ACEi or ARB given history of microalbuminuria, unable to discuss with patient prior to phone disconnecting. Will discuss at next follow-up.  -Recommend starting Olmesartan 20 mg daily at next follow-up.   Hyperlipidemia: (LDL goal < 70) -Uncontrolled -Current treatment: None -Medications previously tried: NA  -Patient is a candidate for high intensity statin given T2DM and ASCVD risk > 20%. unable to discuss with patient prior to phone disconnecting. Will discuss at next follow-up.  -Recommend rosuvastatin 20 mg daily at next follow-up.   Diabetes (A1c goal <7%) -Uncontrolled -Diagnosed 22+ years,  -Complicated by: Neuropathy, Microalbuminuria, -Current medications: Lantus 80 units twice daily (1.84 u/kg): Appropriate, Query effective -Medications previously tried: Metformin -Current home glucose readings fasting glucose: Unable to monitor  -Denies hypoglycemic symptoms -Feeling less tired  since back on insulin.  -Current meal patterns:  breakfast: Typically skips  lunch: Pimento Cheese Sandwich   dinner: Microwavable meals  drinks: Coffee, water, diet soda.  -Current exercise: Minimal -SWITCH Lantus to [place] given high insulin burden  -SWITCH Invokana to [place] given CKD data and likelihood of PAP approval.   Anxiety/ ADHD (Goal: Minimize symptoms) -Controlled -Managed by Myer Haff, MD -Current treatment: Adderrall 20 mg: Appropriate, Effective, Query Safe -Medications previously tried/failed: NA -Discussed impact of stimulants on blood pressure. -Recommended to continue current medication  Chronic Pain (Goal: Minimize pain symptoms) -Controlled -Managed by Mohammed Kindle, MD -Current treatment  Gabapentin 300 mg: Appropriate, Query effective Methadone 10 mg: Appropriate, Query effective Methocarbamol 500 mg every 8 hours as needed: Appropriate, Query effective Oxycodone 5 mg: Appropriate, Query effective  -Medications previously tried: NA -Unable to discuss pain management prior to phone call disconnecting.   -Recommended to continue current medication  Patient Goals/Self-Care Activities Patient will:  - check glucose daily before breakfast, document, and provide at future appointments check blood pressure weekly, document, and provide at future appointments  Follow Up Plan: Telephone follow up appointment with care management team member scheduled for:  07/05/2021 at 9:15 AM      Medication Assistance: Application for [place]andis Ban, [place]  medication assistance program. in process.  Anticipated assistance start date TBD.  See plan of care for additional detail.  Compliance/Adherence/Medication fill history: Care Gaps: COVID 19 Vaccine Pneumococcal Vaccine Ophthalmology Exam  PAP smear- Modifier Colonoscopy Mammogram Shingrix Vaccine A1C was 11.8% on 05/06/2021 HTN: was 140/68 on 05/06/2021  Star-Rating Drugs: Invokana 100 mg last filled  05/26/2019 for 30 day supply at [facility]. Pioglitazone 15 mg not on fill report  Patient's preferred pharmacy is:  [facility], [address] [place] [address] Phone: [phone] Fax: [place], [address] [place] [address] Phone: [phone] Fax: [phone]  [id number]- [facility] - [place], [id number]: [phone]: [phone] Uses pill box? No Pt endorses 25% compliance  We discussed: Current pharmacy is preferred with insurance plan and patient is satisfied with pharmacy services Patient decided to: Continue current medication management strategy  Care Plan and Follow Up Patient Decision:  Patient agrees to Care Plan and Follow-up.  Plan: Telephone follow up appointment with care management team member scheduled for:  07/05/2021 at [place] PharmD, [phone]

## 2021-06-13 ENCOUNTER — Telehealth: Payer: Self-pay

## 2021-06-13 DIAGNOSIS — E1169 Type 2 diabetes mellitus with other specified complication: Secondary | ICD-10-CM | POA: Insufficient documentation

## 2021-06-13 MED ORDER — BLOOD GLUCOSE MONITOR KIT: PACK | 0 refills | Status: DC

## 2021-06-13 NOTE — Telephone Encounter (Signed)
Copied from [place] [phone]. Topic: General - Inquiry >> Jun 13, 2021 11:35 AM Greggory Keen D wrote: Reason for CRM: Pt called asking if Dr. Caryn Section had any samples of insulin that she could have.    CB#  [id number]

## 2021-06-13 NOTE — Telephone Encounter (Signed)
Please review.  We don't have Lantus or Novolog.  Is there something she could take instead?  We have:   Toujeo Max 300units/ML Tyler Aas 200units/ML Tresiba 100units/ML   Thanks,   Mickel Baas

## 2021-06-13 NOTE — Patient Instructions (Signed)
Visit Information It was great speaking with you today!  Please let me know if you have any questions about our visit.   Goals Addressed             This Visit's Progress    Monitor and Manage My Blood Sugar-Diabetes Type 2       Timeframe:  Long-Range Goal Priority:  High Start Date:  06/07/2021                           Expected End Date: 06/13/2022                      Follow Up within 30 days   - check blood sugar daily before breakfast - check blood sugar if I feel it is too high or too low - enter blood sugar readings and medication or insulin into daily log - take the blood sugar log to all doctor visits    Why is this important?   Checking your blood sugar at home helps to keep it from getting very high or very low.  Writing the results in a diary or log helps the doctor know how to care for you.  Your blood sugar log should have the time, date and the results.  Also, write down the amount of insulin or other medicine that you take.  Other information, like what you ate, exercise done and how you were feeling, will also be helpful.     Notes:         Patient Care Plan: General Pharmacy (Adult)     Problem Identified: Hypertension, Hyperlipidemia, Diabetes, Anxiety, and ADHD, Chronic Pain   Priority: High     Long-Range Goal: Patient-Specific Goal   Start Date: 06/13/2021  Expected End Date: 06/13/2022  This Visit's Progress: On track  Priority: High  Note:   Current Barriers:  Unable to independently afford treatment regimen Unable to achieve control of diabetes  Suboptimal therapeutic regimen for HTN, HLD  Pharmacist Clinical Goal(s):  Patient will verbalize ability to afford treatment regimen achieve control of Diabetes as evidenced by A1c less than 7% adhere to plan to optimize therapeutic regimen for HTN, HLD as evidenced by report of adherence to recommended medication management changes through collaboration with PharmD and provider.    Interventions: 1:1 collaboration with Birdie Sons, MD regarding development and update of comprehensive plan of care as evidenced by provider attestation and co-signature Inter-disciplinary care team collaboration (see longitudinal plan of care) Comprehensive medication review performed; medication list updated in electronic medical record  Hypertension (BP goal <130/80) -Uncontrolled -Current treatment: None -Medications previously tried: Lisinopril, Carvedilol   -Current home readings: Does not monitor at home.  -Would recommend ACEi or ARB given history of microalbuminuria, unable to discuss with patient prior to phone disconnecting. Will discuss at next follow-up.  -Recommend starting Olmesartan 20 mg daily at next follow-up.   Hyperlipidemia: (LDL goal < 70) -Uncontrolled -Current treatment: None -Medications previously tried: NA  -Patient is a candidate for high intensity statin given T2DM and ASCVD risk > 20%. unable to discuss with patient prior to phone disconnecting. Will discuss at next follow-up.  -Recommend rosuvastatin 20 mg daily at next follow-up.   Diabetes (A1c goal <7%) -Uncontrolled -Diagnosed 60+ years,  -Complicated by: Neuropathy, Microalbuminuria, -Current medications: Lantus 80 units twice daily (1.84 u/kg): Appropriate, Query effective -Medications previously tried: Metformin -Current home glucose readings fasting glucose: Unable to monitor  -  Denies hypoglycemic symptoms -Feeling less tired since back on insulin.  -Current meal patterns:  breakfast: Typically skips  lunch: Pimento Cheese Sandwich   dinner: Microwavable meals drinks: Coffee, water, diet soda.  -Current exercise: Minimal -SWITCH Lantus to [place] given high insulin burden  -SWITCH Invokana to [place] given CKD data and likelihood of PAP approval.   Anxiety/ ADHD (Goal: Minimize symptoms) -Controlled -Managed by Myer Haff, MD -Current treatment: Adderrall 20 mg:  Appropriate, Effective, Query Safe -Medications previously tried/failed: NA -Discussed impact of stimulants on blood pressure. -Recommended to continue current medication  Chronic Pain (Goal: Minimize pain symptoms) -Controlled -Managed by Mohammed Kindle, MD -Current treatment  Gabapentin 300 mg: Appropriate, Query effective Methadone 10 mg: Appropriate, Query effective Methocarbamol 500 mg every 8 hours as needed: Appropriate, Query effective Oxycodone 5 mg: Appropriate, Query effective  -Medications previously tried: NA -Unable to discuss pain management prior to phone call disconnecting.   -Recommended to continue current medication  Patient Goals/Self-Care Activities Patient will:  - check glucose daily before breakfast, document, and provide at future appointments check blood pressure weekly, document, and provide at future appointments  Follow Up Plan: Telephone follow up appointment with care management team member scheduled for:  07/05/2021 at 9:15 AM    Ms. Pigeon was given information about Chronic Care Management services today including:  CCM service includes personalized support from designated clinical staff supervised by her physician, including individualized plan of care and coordination with other care providers 24/7 contact phone numbers for assistance for urgent and routine care needs. Standard insurance, coinsurance, copays and deductibles apply for chronic care management only during months in which we provide at least 20 minutes of these services. Most insurances cover these services at 100%, however patients may be responsible for any copay, coinsurance and/or deductible if applicable. This service may help you avoid the need for more expensive face-to-face services. Only one practitioner may furnish and bill the service in a calendar month. The patient may stop CCM services at any time (effective at the end of the month) by phone call to the office staff.  Patient  agreed to services and verbal consent obtained.   The patient verbalized understanding of instructions, educational materials, and care plan provided today and declined offer to receive copy of patient instructions, educational materials, and care plan.   Junius Argyle, PharmD, Para [place], CPP  Clinical Pharmacist Practitioner  [facility] [phone]

## 2021-06-20 ENCOUNTER — Telehealth: Payer: Self-pay

## 2021-06-20 NOTE — Progress Notes (Signed)
Chronic Care Management Pharmacy Assistant   Name: MAKAYLEE SPIELBERG  MRN: [id number] DOB: 08/31/1960  Reason for Encounter: Medication Review/Patient assistance application for Farxiga, Cira Servant, and Tresiba.   Recent office visits:  No recent office visit  Recent consult visits:  No recent consult Visit  Hospital visits:  None in previous 6 months  Medications: Outpatient Encounter Medications as of 06/20/2021  Medication Sig   amphetamine-dextroamphetamine (ADDERALL) 20 MG tablet    aspirin 325 MG tablet Take 325 mg by mouth daily. Reported on 07/30/2015   blood glucose meter kit and supplies KIT Dispense based on patient and insurance preference. Use up to four times daily as directed.   Blood Glucose Monitoring Suppl (ONE TOUCH ULTRA MINI) w/Device KIT Use to check blood sugar once a day, Dm2 E11.65   canagliflozin (INVOKANA) 100 MG TABS tablet Take 1 tablet (100 mg total) by mouth daily before breakfast. (Patient not taking: Reported on 06/13/2021)   cefdinir (OMNICEF) 300 MG capsule Take 1 capsule (300 mg total) by mouth 2 (two) times daily. (Patient not taking: Reported on 05/06/2021)   gabapentin (NEURONTIN) 300 MG capsule    insulin glargine (LANTUS) 100 UNIT/ML injection Inject 0.8 mLs (80 Units total) into the skin 2 (two) times daily.   Insulin Syringe 27G X 1/2" 1 ML MISC 1 Units by Does not apply route daily. For insulin dependent type 2 diabetes   methadone (DOLOPHINE) 10 MG tablet Limit 5-7 tabs by mouth per day if tolerated (Patient taking differently: Take 10 mg by mouth. Limit 5-7 tabs by mouth per day if tolerated)   methocarbamol (ROBAXIN) 500 MG tablet Take 1 tablet (500 mg total) by mouth every 8 (eight) hours as needed. (Patient not taking: Reported on 05/06/2021)   NARCAN 4 MG/0.1ML LIQD nasal spray kit  (Patient not taking: Reported on 05/06/2021)   NOVOLOG FLEXPEN 100 UNIT/ML FlexPen INJECT 0-20 UNITS 3 TIMES A DAY BEFORE MEALS PER SLIDING SCALE PER MD (Patient not  taking: Reported on 06/07/2021)   oxyCODONE (OXY IR/ROXICODONE) 5 MG immediate release tablet Limit 4 - 6 tabs po / day for breakthrough pain while taking methadone if tolerated   pioglitazone (ACTOS) 15 MG tablet Take 1 tablet (15 mg total) by mouth daily. (Patient not taking: Reported on 05/06/2021)   No facility-administered encounter medications on file as of 06/20/2021.    Care Gaps: COVID 19 Vaccine Ophthalmology Exam  PAP smear- Modifier Colonoscopy Mammogram Shingrix Vaccine A1C was 11.8% on 05/06/2021 HTN: was 140/68 on 05/06/2021   Star Rating Drugs: Invokana 100 mg last filled 05/26/2019 for 30 day supply at [facility]. Pioglitazone 15 mg not on fill report   Medication Fill Gaps: Lantus 100 UNIT/ML injection last filled 02/15/2021 for 5 day supply. Novolog  FLEXPEN 100 UNIT/ML last filled 11/04/2020 for 25 day supply.  I received a task from Junius Argyle, CPP requesting that I start the application for patient assistance on the medication Farxiga, Novolog, Tresiba.    I spoke with the patient and she  requested that the application mailed.I informed  her once she receives the application she will need to complete her part of the application and return ithe applications to her PCP office for Junius Argyle, CPP to fax over to [employer]  and [employer]  for processing.Informed patient to include a copy of her proof of income AND a copy of her Explanation of Benefits (EOB) statement from her insurance. I  provided my phone number of  [phone] if she has any questions.   Application emailed to Junius Argyle, CPP for review and to Mail to her home..  Per Clinical Pharmacist, Glucometer + supplies sent to [facility]. Please inform patient to pick up if possible, and begin monitoring blood sugars once daily before breakfast.  Patient Verbalized understanding.  [place] Pharmacist Assistant [phone]

## 2021-06-21 DIAGNOSIS — E6609 Other obesity due to excess calories: Secondary | ICD-10-CM | POA: Diagnosis not present

## 2021-06-21 DIAGNOSIS — M792 Neuralgia and neuritis, unspecified: Secondary | ICD-10-CM | POA: Diagnosis not present

## 2021-06-21 DIAGNOSIS — G894 Chronic pain syndrome: Secondary | ICD-10-CM | POA: Diagnosis not present

## 2021-06-21 DIAGNOSIS — M48062 Spinal stenosis, lumbar region with neurogenic claudication: Secondary | ICD-10-CM | POA: Diagnosis not present

## 2021-06-21 DIAGNOSIS — E0840 Diabetes mellitus due to underlying condition with diabetic neuropathy, unspecified: Secondary | ICD-10-CM | POA: Diagnosis not present

## 2021-06-21 DIAGNOSIS — M25559 Pain in unspecified hip: Secondary | ICD-10-CM | POA: Diagnosis not present

## 2021-06-21 DIAGNOSIS — M5136 Other intervertebral disc degeneration, lumbar region: Secondary | ICD-10-CM | POA: Diagnosis not present

## 2021-06-21 DIAGNOSIS — G8929 Other chronic pain: Secondary | ICD-10-CM | POA: Diagnosis not present

## 2021-06-21 DIAGNOSIS — M47897 Other spondylosis, lumbosacral region: Secondary | ICD-10-CM | POA: Diagnosis not present

## 2021-06-21 DIAGNOSIS — M259 Joint disorder, unspecified: Secondary | ICD-10-CM | POA: Diagnosis not present

## 2021-06-21 DIAGNOSIS — Z79891 Long term (current) use of opiate analgesic: Secondary | ICD-10-CM | POA: Diagnosis not present

## 2021-06-28 DIAGNOSIS — Z794 Long term (current) use of insulin: Secondary | ICD-10-CM

## 2021-06-28 DIAGNOSIS — E1165 Type 2 diabetes mellitus with hyperglycemia: Secondary | ICD-10-CM | POA: Diagnosis not present

## 2021-06-28 DIAGNOSIS — E1169 Type 2 diabetes mellitus with other specified complication: Secondary | ICD-10-CM

## 2021-06-28 DIAGNOSIS — E785 Hyperlipidemia, unspecified: Secondary | ICD-10-CM | POA: Diagnosis not present

## 2021-06-28 DIAGNOSIS — I10 Essential (primary) hypertension: Secondary | ICD-10-CM

## 2021-07-04 ENCOUNTER — Telehealth: Payer: Self-pay

## 2021-07-04 NOTE — Progress Notes (Signed)
Chronic Care Management APPOINTMENT REMINDER   Called SANOE HAZAN, No answer, left message of appointment on 07/05/2021 at 9:15 am via telephone visit with Junius Argyle , Pharm D. Notified to have all medications, supplements, blood pressure and/or blood sugar logs available during appointment and to return call if need to reschedule.  [place] Pharmacist Assistant [phone]

## 2021-07-05 ENCOUNTER — Telehealth: Payer: Self-pay

## 2021-07-05 ENCOUNTER — Telehealth: Payer: Medicare Other

## 2021-07-05 NOTE — Progress Notes (Deleted)
Chronic Care Management Pharmacy Note  07/05/2021 Name:  Amy Meyers MRN:  [id number] DOB:  [birth year]  Summary: Patient presents for initial CCM consult. Unfortunately our phone call disconnected partway through our conversation so we were unable to fully reconcile medications. She is back on Lantus, but admits financial challenges with other diabetes medications.   Recommendations/Changes made from today's visit: -SWITCH Lantus to [place] given high insulin burden  -SWITCH Invokana to [place] given CKD data and likelihood of PAP approval.  -PAP for Farxiga, Tresiba, and Novolog  -Recommend rosuvastatin 20 mg daily at next follow-up.  -Recommend starting Olmesartan 20 mg daily at next follow-up.   Plan: CPP follow-up 1 month   Subjective: Amy Meyers is an 61 y.o. year old female who is a primary patient of Fisher, Kirstie Peri, MD.  The CCM team was consulted for assistance with disease management and care coordination needs.    Engaged with patient by telephone for initial visit in response to provider referral for pharmacy case management and/or care coordination services.   Consent to Services:  The patient was given the following information about Chronic Care Management services today, agreed to services, and gave verbal consent: 1. CCM service includes personalized support from designated clinical staff supervised by the primary care provider, including individualized plan of care and coordination with other care providers 2. 24/7 contact phone numbers for assistance for urgent and routine care needs. 3. Service will only be billed when office clinical staff spend 20 minutes or more in a month to coordinate care. 4. Only one practitioner may furnish and bill the service in a calendar month. 5.The patient may stop CCM services at any time (effective at the end of the month) by phone call to the office staff. 6. The patient will be responsible for cost sharing (co-pay) of up to 20% of the  service fee (after annual deductible is met). Patient agreed to services and consent obtained.  Patient Care Team: Birdie Sons, MD as PCP - General (Family Medicine) Mohammed Kindle, MD as Attending Physician (Pain Medicine) Samara Deist, DPM as Referring Physician (Podiatry) Germaine Pomfret, [facility] (Pharmacist)  Recent office visits: 05/06/2021 Talitha Givens [name] (PCP Office) Change Lantus to 0.8 mLs (80 units total) into the skin 2 times daily, AMB Referral to [facility] Coordination,follow up in 3 months   Recent consult visits: No recent consult visit  Hospital visits:  Admitted to the hospital on 02/26/2021 due to Chest wall pain. Discharge date was 02/26/2021. Discharged from [place]?Medications Started at [facility] Discharge:?? -started Tylenol every 6 hours as needed for discomfort.    Medication Changes at Hospital Discharge: -Changed None   Medications Discontinued at Hospital Discharge: -Stopped None   Medications that remain the same after Hospital Discharge:??  -All other medications will remain the same.    Objective:  Lab Results  Component Value Date   CREATININE 0.80 05/27/2021   BUN 11 05/27/2021   GFRNONAA >60 06/23/2020   GFRAA 100 05/05/2019   NA 142 05/27/2021   K 4.3 05/27/2021   CALCIUM 9.1 05/27/2021   CO2 29 05/27/2021   GLUCOSE 223 (H) 05/27/2021    Lab Results  Component Value Date/Time   HGBA1C 11.8 (A) 05/06/2021 11:49 AM   HGBA1C 8.7 (H) 05/05/2019 03:48 PM   HGBA1C 8.4 09/28/2017 12:07 PM   HGBA1C 6.7 (A) 01/28/2014 12:00 AM   MICROALBUR 100 09/28/2017 12:07 PM    Last diabetic  Eye exam: No results found for: HMDIABEYEEXA  Last diabetic Foot exam: No results found for: HMDIABFOOTEX   Lab Results  Component Value Date   CHOL 176 05/27/2021   HDL 30 (L) 05/27/2021   LDLCALC 118 (H) 05/27/2021   TRIG 157 (H) 05/27/2021   CHOLHDL 5.9 (H) 05/27/2021    Hepatic Function Latest Ref Rng &  Units 05/27/2021 05/05/2019 09/28/2017  Total Protein 6.0 - 8.5 g/dL 7.1 6.9 7.3  Albumin 3.8 - 4.8 g/dL 4.0 3.7(L) 4.0  AST 0 - 40 IU/L _0 ALT 0 - 32 IU/L _1 Alk Phosphatase 44 - 121 IU/L 84 73 73  Total Bilirubin 0.0 - 1.2 mg/dL 0.5 0.3 0.4    Lab Results  Component Value Date/Time   TSH 1.770 05/05/2019 03:48 PM    CBC Latest Ref Rng & Units 05/27/2021 06/23/2020 09/28/2017  WBC 3.4 - 10.8 x10E3/uL 11.4(H) 9.9 10.7  Hemoglobin 11.1 - 15.9 g/dL 15.1 15.5(H) 15.1  Hematocrit 34.0 - 46.6 % 44.7 47.2(H) 45.1  Platelets 150 - 450 x10E3/uL 192 164 176    No results found for: VD25OH  Clinical ASCVD: No  The 10-year ASCVD risk score (Arnett DK, et al., 2019) is: 29%   Values used to calculate the score:     Age: 23 years     Sex: Female     Is Non-Hispanic African American: No     Diabetic: Yes     Tobacco smoker: Yes     Systolic Blood Pressure: 915 mmHg     Is BP treated: Yes     HDL Cholesterol: 30 mg/dL     Total Cholesterol: 176 mg/dL    Depression screen [facility] 2/9 05/05/2019 06/23/2016 01/13/2016  Decreased Interest 3 0 0  Down, Depressed, Hopeless 3 3 0  PHQ - 2 Score 6 3 0  Altered sleeping 1 1 -  Tired, decreased energy 3 3 -  Change in appetite 3 1 -  Feeling bad or failure about yourself  3 3 -  Trouble concentrating 3 0 -  Moving slowly or fidgety/restless 1 0 -  Suicidal thoughts 1 1 -  PHQ-9 Score 21 12 -  Difficult doing work/chores Very difficult Somewhat difficult -    Social History   Tobacco Use  Smoking Status Every Day   Packs/day: 1.00   Years: 44.00   Pack years: 44.00   Types: Cigarettes  Smokeless Tobacco Former   Types: Chew   BP Readings from Last 3 Encounters:  05/06/21 140/68  06/23/20 (!) 135/46  02/29/20 131/68   Pulse Readings from Last 3 Encounters:  05/06/21 88  02/26/21 82  06/23/20 73   Wt Readings from Last 3 Encounters:  05/06/21 191 lb (86.6 kg)  02/26/21 200 lb (90.7 kg)  06/23/20 199 lb (90.3 kg)   BMI  Readings from Last 3 Encounters:  05/06/21 31.78 kg/m  02/26/21 33.28 kg/m  06/23/20 32.12 kg/m    Assessment/Interventions: Review of patient past medical history, allergies, medications, health status, including review of consultants reports, laboratory and other test data, was performed as part of comprehensive evaluation and provision of chronic care management services.   SDOH:  (Social Determinants of Health) assessments and interventions performed: Yes   SDOH Screenings   Alcohol Screen: Not on file  Depression (AVW9-7): Not on file  Financial Resource Strain: High Risk   Difficulty of Paying Living Expenses: Very hard  Food Insecurity: Not on file  Housing: Not  on file  Physical Activity: Not on file  Social Connections: Not on file  Stress: Not on file  Tobacco Use: High Risk   Smoking Tobacco Use: Every Day   Smokeless Tobacco Use: Former   Passive Exposure: Not on file  Transportation Needs: Not on file    [place]  Allergies  Allergen Reactions   Meloxicam Itching, Swelling and Nausea And Vomiting   Sulfa Antibiotics Other (See Comments)    Medications Reviewed Today     Reviewed by Germaine Pomfret, [facility] (Pharmacist) on 06/13/21 at [place] List Status: <None>   Medication Order Taking? Sig Documenting Provider Last Dose Status Informant  amphetamine-dextroamphetamine (ADDERALL) 20 MG tablet [id number]   [provider]  Active   aspirin 325 MG tablet [id number]  Take 325 mg by mouth daily. Reported on 07/30/2015 [provider]  Active Self  Blood Glucose Monitoring Suppl (ONE TOUCH ULTRA MINI) w/Device KIT [id number]  Use to check blood sugar once a day, Dm2 E11.65 Mecum, Erin E, [name]  Active   canagliflozin (INVOKANA) 100 MG TABS tablet [id number] No Take 1 tablet (100 mg total) by mouth daily before breakfast.  Patient not taking: Reported on 06/13/2021   Mecum, Dani Gobble, [name] Not Taking Active   cefdinir (OMNICEF) 300 MG capsule  [id number]  Take 1 capsule (300 mg total) by mouth 2 (two) times daily.  Patient not taking: Reported on 05/06/2021   Versie Starks, [name]  Active   gabapentin (NEURONTIN) 300 MG capsule [id number]   [provider]  Active   insulin glargine (LANTUS) 100 UNIT/ML injection [id number] Yes Inject 0.8 mLs (80 Units total) into the skin 2 (two) times daily. Mecum, Erin E, [name] Taking Active   Insulin Syringe 27G X 1/2" 1 ML MISC [id number]  1 Units by Does not apply route daily. For insulin dependent type 2 diabetes Birdie Sons, MD  Active   methadone (DOLOPHINE) 10 MG tablet [id number]  Limit 5-7 tabs by mouth per day if tolerated  Patient taking differently: Take 10 mg by mouth. Limit 5-7 tabs by mouth per day if tolerated   Mohammed Kindle, MD  Active   methocarbamol (ROBAXIN) 500 MG tablet [id number]  Take 1 tablet (500 mg total) by mouth every 8 (eight) hours as needed.  Patient not taking: Reported on 05/06/2021   Johnn Hai, [name]  Active   NARCAN 4 MG/0.1ML LIQD nasal spray kit [id number]    Patient not taking: Reported on 05/06/2021   [provider]  Active   NOVOLOG FLEXPEN 100 UNIT/ML FlexPen [id number] No INJECT 0-20 UNITS 3 TIMES A DAY BEFORE MEALS PER SLIDING SCALE PER MD  Patient not taking: Reported on 06/07/2021   Birdie Sons, MD Not Taking Active   oxyCODONE (OXY IR/ROXICODONE) 5 MG immediate release tablet [id number]  Limit 4 - 6 tabs po / day for breakthrough pain while taking methadone if tolerated Mohammed Kindle, MD  Active   pioglitazone (ACTOS) 15 MG tablet [id number] No Take 1 tablet (15 mg total) by mouth daily.  Patient not taking: Reported on 05/06/2021   Birdie Sons, MD Not Taking Active   Med List Note Hart Rochester, RN 01/13/16 0820): UDS  09/16/15 MR 02/23/16 CS 01/12/2015              Patient Active Problem List   Diagnosis Date Noted   Hyperlipidemia associated with type 2 diabetes mellitus ([place]) 06/13/2021   Type 2  diabetes mellitus with hyperglycemia, with long-term current use of insulin ([place]) 05/06/2021   Proteinuria 12/27/2016   Callous ulcer ([place]) 05/11/2015   Anxiety 04/13/2015   Arthritis, degenerative 04/06/2015   Neuropathy 04/06/2015   Back pain, chronic 04/06/2015   Abnormal ECG 03/17/2015   Hypertension 03/17/2015   NAFLD (nonalcoholic fatty liver disease) 03/17/2015   Morbid obesity ([place]) 03/17/2015   Palpitations 03/17/2015   Pulmonary edema 03/17/2015   PVC's (premature ventricular contractions) 03/17/2015   Breath shortness 03/17/2015   Headache, tension-type 03/17/2015   Type 2 diabetes mellitus with right diabetic foot ulcer ([place]) 01/06/2015   Facet syndrome, lumbar 11/26/2014   Sacroiliac joint dysfunction 11/26/2014   DDD (degenerative disc disease), thoracolumbar 09/29/2014   Diabetes mellitus due to underlying condition with diabetic neuropathy ([place]) 09/29/2014   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     Medication Assistance: Application for [place]  medication assistance program. in process.  Anticipated assistance start date TBD.  See plan of care for additional detail.  Compliance/Adherence/Medication fill history: Care Gaps: COVID 19 Vaccine Pneumococcal Vaccine Ophthalmology Exam  PAP smear- Modifier Colonoscopy Mammogram Shingrix Vaccine A1C was 11.8% on 05/06/2021 HTN: was 140/68 on 05/06/2021  [facility] Drugs: Invokana 100 mg  last filled 05/26/2019 for 30 day supply at [facility] Pioglitazone 15 mg not on fill report  Patient's preferred pharmacy is:  [place], [place], [address] [place] [address] Phone: [phone] Fax: [place], [address] [place] [address] Phone: [phone] Fax: [phone]  [id number] [place]: [phone]: [phone] Uses pill box? No Pt endorses 25% compliance  We discussed: Current pharmacy is preferred with insurance plan and patient is satisfied with pharmacy services Patient decided to: Continue current medication management strategy  Care Plan and Follow Up Patient Decision:  Patient agrees to Care Plan and Follow-up.  Plan: Telephone follow up appointment with care management team member scheduled for:  07/05/2021 at [place] PharmD, [place] CPP  Clinical Pharmacist Practitioner  [phone] Current Barriers:  Unable to independently afford treatment regimen Unable to achieve control of diabetes  Suboptimal therapeutic regimen for HTN, HLD  Pharmacist Clinical Goal(s):  Patient will verbalize ability to afford treatment regimen achieve control of Diabetes as evidenced by A1c less than 7% adhere to plan to optimize therapeutic regimen for HTN, HLD as evidenced by report of adherence to recommended medication management changes through collaboration with PharmD and provider.   Interventions: 1:1 collaboration with FBirdie Sons MD regarding development and update of comprehensive plan of care as evidenced by provider attestation and co-signature Inter-disciplinary care team collaboration (see longitudinal plan of care) Comprehensive medication review performed; medication list updated in  electronic medical record  Hypertension (  BP goal <130/80) -Uncontrolled -Current treatment: None -Medications previously tried: Lisinopril, Carvedilol   -Current home readings: Does not monitor at home.  -Would recommend ACEi or ARB given history of microalbuminuria, unable to discuss with patient prior to phone disconnecting. Will discuss at next follow-up.  -Recommend starting Olmesartan 20 mg daily at next follow-up.   Hyperlipidemia: (LDL goal < 70) -Uncontrolled -Current treatment: None -Medications previously tried: NA  -Patient is a candidate for high intensity statin given T2DM and ASCVD risk > 20%. unable to discuss with patient prior to phone disconnecting. Will discuss at next follow-up.  -Recommend rosuvastatin 20 mg daily at next follow-up.   Diabetes (A1c goal <7%) -Uncontrolled -Diagnosed 48+ years,  -Complicated by: Neuropathy, Microalbuminuria -Current medications: Lantus 80 units twice daily (1.84 u/kg): Appropriate, Query effective -Medications previously tried: Metformin -Current home glucose readings fasting glucose: Unable to monitor  -Denies hypoglycemic symptoms -Feeling less tired since back on insulin.  -Current meal patterns:  breakfast: Typically skips  lunch: Pimento Cheese Sandwich   dinner: Microwavable meals drinks: Coffee, water, diet soda.  -Current exercise: Minimal -SWITCH Lantus to [place] given high insulin burden  -SWITCH Invokana to [place] given CKD data and likelihood of PAP approval.   Anxiety/ ADHD (Goal: Minimize symptoms) -Controlled -Managed by Myer Haff, MD -Current treatment: Adderrall 20 mg: Appropriate, Effective, Query Safe -Medications previously tried/failed: NA -Discussed impact of stimulants on blood pressure. -Recommended to continue current medication  Chronic Pain (Goal: Minimize pain symptoms) -Controlled -Managed by Mohammed Kindle, MD -Current treatment  Gabapentin 300 mg: Appropriate, Query  effective Methadone 10 mg: Appropriate, Query effective Methocarbamol 500 mg every 8 hours as needed: Appropriate, Query effective Oxycodone 5 mg: Appropriate, Query effective  -Medications previously tried: NA -Unable to discuss pain management prior to phone call disconnecting.   -Recommended to continue current medication  Patient Goals/Self-Care Activities Patient will:  - check glucose daily before breakfast, document, and provide at future appointments check blood pressure weekly, document, and provide at future appointments  Follow Up Plan: ***

## 2021-07-05 NOTE — Telephone Encounter (Signed)
°  Care Management   Follow Up Note   07/05/2021 Name: Amy Meyers MRN: [id number] DOB: Mar 26, 1961   Referred by: Birdie Sons, MD Reason for referral : No chief complaint on file.   An unsuccessful telephone outreach was attempted today. The patient was referred to the case management team for assistance with care management and care coordination.   Follow Up Plan: A HIPPA compliant phone message was left for the patient providing contact information and requesting a return call.   Junius Argyle, PharmD, [place], CPP  Clinical Pharmacist Practitioner  [facility] [phone]

## 2021-07-19 DIAGNOSIS — G894 Chronic pain syndrome: Secondary | ICD-10-CM | POA: Diagnosis not present

## 2021-07-19 DIAGNOSIS — M259 Joint disorder, unspecified: Secondary | ICD-10-CM | POA: Diagnosis not present

## 2021-07-19 DIAGNOSIS — M5136 Other intervertebral disc degeneration, lumbar region: Secondary | ICD-10-CM | POA: Diagnosis not present

## 2021-07-19 DIAGNOSIS — Z79891 Long term (current) use of opiate analgesic: Secondary | ICD-10-CM | POA: Diagnosis not present

## 2021-08-03 ENCOUNTER — Other Ambulatory Visit: Payer: Self-pay | Admitting: Family Medicine

## 2021-08-03 DIAGNOSIS — Z794 Long term (current) use of insulin: Secondary | ICD-10-CM

## 2021-08-03 DIAGNOSIS — E1165 Type 2 diabetes mellitus with hyperglycemia: Secondary | ICD-10-CM

## 2021-08-03 MED ORDER — INSULIN GLARGINE 100 UNIT/ML ~~LOC~~ SOLN: 80.0000 [IU] | Freq: Two times a day (BID) | SUBCUTANEOUS | 0 refills | Status: DC

## 2021-08-03 MED ORDER — INSULIN DETEMIR 100 UNIT/ML ~~LOC~~ SOLN: 80.0000 [IU] | Freq: Two times a day (BID) | SUBCUTANEOUS | 0 refills | Status: DC

## 2021-08-03 NOTE — Telephone Encounter (Signed)
Pt was calling to follow up on this Rx, please advise.  ?

## 2021-08-03 NOTE — Telephone Encounter (Signed)
Copied from [place] [phone]. Topic: Quick Communication - Rx Refill/Question ?>> Aug 03, 2021 12:19 PM Lenon Curt, [place] A wrote: ?Medication: insulin glargine (LANTUS) 100 UNIT/ML injection [[id number]] - patient has 0 injections remaining  ? ?Has the patient contacted their pharmacy? No. The patient was directed to call their pharmacy for future refills  ?(Agent: If no, request that the patient contact the pharmacy for the refill. If patient does not wish to contact the pharmacy document the reason why and proceed with request.) ?(Agent: If yes, when and what did the pharmacy advise?) ? ?Preferred Pharmacy (with phone number or street name): [id number] [address]?Phone: [phone]: [phone]?Hours: Not open 24 hours ? ? ?Has the patient been seen for an appointment in the last year OR does the patient have an upcoming appointment? Yes.   ? ?Agent: Please be advised that RX refills may take up to 3 business days. We ask that you follow-up with your pharmacy. ?

## 2021-08-10 ENCOUNTER — Other Ambulatory Visit: Payer: Self-pay | Admitting: Family Medicine

## 2021-08-10 DIAGNOSIS — E1165 Type 2 diabetes mellitus with hyperglycemia: Secondary | ICD-10-CM

## 2021-08-23 DIAGNOSIS — M5136 Other intervertebral disc degeneration, lumbar region: Secondary | ICD-10-CM | POA: Diagnosis not present

## 2021-08-23 DIAGNOSIS — M259 Joint disorder, unspecified: Secondary | ICD-10-CM | POA: Diagnosis not present

## 2021-08-23 DIAGNOSIS — Z79891 Long term (current) use of opiate analgesic: Secondary | ICD-10-CM | POA: Diagnosis not present

## 2021-08-23 DIAGNOSIS — G894 Chronic pain syndrome: Secondary | ICD-10-CM | POA: Diagnosis not present

## 2021-08-24 ENCOUNTER — Other Ambulatory Visit: Payer: Self-pay | Admitting: Family Medicine

## 2021-08-24 DIAGNOSIS — E1165 Type 2 diabetes mellitus with hyperglycemia: Secondary | ICD-10-CM

## 2021-08-26 ENCOUNTER — Other Ambulatory Visit: Payer: Self-pay | Admitting: Family Medicine

## 2021-08-26 DIAGNOSIS — E1165 Type 2 diabetes mellitus with hyperglycemia: Secondary | ICD-10-CM

## 2021-08-26 MED ORDER — INSULIN GLARGINE 100 UNIT/ML ~~LOC~~ SOLN: SUBCUTANEOUS | 0 refills | Status: DC

## 2021-08-26 NOTE — Telephone Encounter (Signed)
Medication: LANTUS 100 UNIT/ML injection [[id number]] - it appears as if this medication was sent 08/24/21. Can this medication please be resent to the pharmacy ? ?Has the patient contacted their pharmacy? YES ?(Agent: If no, request that the patient contact the pharmacy for the refill. If patient does not wish to contact the pharmacy document the reason why and proceed with request.) ?(Agent: If yes, when and what did the pharmacy advise?) ? ?Preferred Pharmacy (with phone number or street name): [id number] [address]?Phone: [phone]: [phone]?Hours: Not open 24 hours ? ? ?Has the patient been seen for an appointment in the last year OR does the patient have an upcoming appointment? YES 09/02/21 ? ?Agent: Please be advised that RX refills may take up to 3 business days. We ask that you follow-up with your pharmacy. ?

## 2021-08-26 NOTE — Telephone Encounter (Signed)
[facility] [place], [facility] about the refill(s) Lantus requested. Advised it was sent on 08/24/21/ 0 refill(s). He states that the medication was not received. Advised that the medication will be resent. ?

## 2021-08-26 NOTE — Telephone Encounter (Signed)
Requested Prescriptions  ?Pending Prescriptions Disp Refills  ?? insulin glargine (LANTUS) 100 UNIT/ML injection 10 mL 0  ?  Sig: INJECT 80 UNITS INTO THE SKIN 2 TIMES DAILY. PLEASE SCHEDULE OFFICE VISIT BEFORE ANY FUTURE REFILL.  ?  ? Endocrinology:  Diabetes - Insulins Failed - 08/26/2021  4:43 PM  ?  ?  Failed - HBA1C is between 0 and 7.9 and within 180 days  ?  Hemoglobin A1C  ?Date Value Ref Range Status  ?05/06/2021 11.8 (A) 4.0 - 5.6 % Final  ? ?Hgb A1c MFr Bld  ?Date Value Ref Range Status  ?05/05/2019 8.7 (H) 4.8 - 5.6 % Final  ?  Comment:  ?           Prediabetes: 5.7 - 6.4 ?         Diabetes: >6.4 ?         Glycemic control for adults with diabetes: <7.0 ?  ?   ?  ?  Passed - Valid encounter within last 6 months  ?  Recent Outpatient Visits   ?      ? 3 months ago Type 2 diabetes mellitus with hyperglycemia, with long-term current use of insulin ([place])  ? [place], [name]  ? 2 years ago Type 2 diabetes mellitus with diabetic neuropathy, with long-term current use of insulin ([place])  ? [facility] Fisher, Kirstie Peri, MD  ? 3 years ago Type 2 diabetes mellitus with diabetic neuropathy, with long-term current use of insulin ([place])  ? [facility] Fisher, Kirstie Peri, MD  ? 4 years ago Type 2 diabetes mellitus with diabetic neuropathy, with long-term current use of insulin ([place])  ? [facility] Caryn Section, Kirstie Peri, MD  ? 5 years ago Dental infection  ? [facility] Caryn Section, Kirstie Peri, MD  ?  ?  ?Future Appointments   ?        ? In 1 week Fisher, Kirstie Peri, MD [facility]  ?  ? ?  ?  ?  ? ?

## 2021-08-29 DIAGNOSIS — F339 Major depressive disorder, recurrent, unspecified: Secondary | ICD-10-CM | POA: Diagnosis not present

## 2021-08-29 DIAGNOSIS — F900 Attention-deficit hyperactivity disorder, predominantly inattentive type: Secondary | ICD-10-CM | POA: Diagnosis not present

## 2021-09-01 NOTE — Progress Notes (Deleted)
?  ? ? ?Established patient visit ? ? ?[name]   DOB: May 03, 1961   61 y.o. Female  MRN: [id number] ?Visit Date: 09/02/2021 ? ?Today's healthcare provider: Lelon Huh, MD  ? ?No chief complaint on file. ? ?Subjective  ?  ?HPI  ?Diabetes Mellitus Type II, Follow-up ? ?Lab Results  ?Component Value Date  ? HGBA1C 11.8 (A) 05/06/2021  ? HGBA1C 8.7 (H) 05/05/2019  ? HGBA1C 8.4 09/28/2017  ? ?Wt Readings from Last 3 Encounters:  ?05/06/21 191 lb (86.6 kg)  ?02/26/21 200 lb (90.7 kg)  ?06/23/20 199 lb (90.3 kg)  ? ?Last seen for diabetes 3 months ago.  ?Management since then includes refilling lantus 80 units BID and Invokana. ?Patient instructed to follow up in 3 months or sooner according to return precautions. ?She reports {excellent/good/fair/poor:19665} compliance with treatment. ?She {is/is not:21021397} having side effects. {document side effects if present:1} ?Symptoms: ?{Yes/No:20286} fatigue {Yes/No:20286} foot ulcerations  ?{Yes/No:20286} appetite changes {Yes/No:20286} nausea  ?{Yes/No:20286} paresthesia of the feet  {Yes/No:20286} polydipsia  ?{Yes/No:20286} polyuria {Yes/No:20286} visual disturbances   ?{Yes/No:20286} vomiting   ? ? ?Home blood sugar records: {diabetes glucometry results:16657} ? ?Episodes of hypoglycemia? {Yes/No:20286} {enter symptoms and frequency of symptoms if yes:1} ?  ?Current insulin regiment: {enter 'none' or type of insulin and number of units taken with each dose of each insulin formulation that the patient is taking:1} ?Most Recent Eye Exam: *** ?{Current exercise:16438:::1} ?{Current diet habits:16563:::1} ? ?Pertinent Labs: ?Lab Results  ?Component Value Date  ? CHOL 176 05/27/2021  ? HDL 30 (L) 05/27/2021  ? [place] 118 (H) 05/27/2021  ? TRIG 157 (H) 05/27/2021  ? CHOLHDL 5.9 (H) 05/27/2021  ? Lab Results  ?Component Value Date  ? NA 142 05/27/2021  ? K 4.3 05/27/2021  ? CREATININE 0.80 05/27/2021  ? EGFR 83 05/27/2021  ? MICROALBUR 100 09/28/2017  ? LABMICR 664.0  05/27/2021  ?  ? ?---------------------------------------------------------------------------------------------------  ? ?Lipid/Cholesterol, Follow-up ? ?Last lipid panel Other pertinent labs  ?Lab Results  ?Component Value Date  ? CHOL 176 05/27/2021  ? HDL 30 (L) 05/27/2021  ? [place] 118 (H) 05/27/2021  ? TRIG 157 (H) 05/27/2021  ? CHOLHDL 5.9 (H) 05/27/2021  ? Lab Results  ?Component Value Date  ? ALT 12 05/27/2021  ? AST 11 05/27/2021  ? PLT 192 05/27/2021  ? TSH 1.770 05/05/2019  ?  ? ?She was last seen for this 3 months ago.  ?During that visit, labs were ordered showing high cholesterol. Patient was advised to follow up to discuss management strategy. ? ?She reports {excellent/good/fair/poor:19665} compliance with treatment. ?She {is/is not:9024} having side effects. {document side effects if present:1} ? ?Symptoms: ?{Yes/No:20286} chest pain {Yes/No:20286} chest pressure/discomfort  ?{Yes/No:20286} dyspnea {Yes/No:20286} lower extremity edema  ?{Yes/No:20286} numbness or tingling of extremity {Yes/No:20286} orthopnea  ?{Yes/No:20286} palpitations {Yes/No:20286} paroxysmal nocturnal dyspnea  ?{Yes/No:20286} speech difficulty {Yes/No:20286} syncope  ? ?Current diet: {diet habits:16563} ?Current exercise: {exercise types:16438} ? ?The 10-year ASCVD risk score (Arnett DK, et al., 2019) is: 29% ? ?---------------------------------------------------------------------------------------------------  ? ?Medications: ?Outpatient Medications Prior to Visit  ?Medication Sig  ? amphetamine-dextroamphetamine (ADDERALL) 20 MG tablet   ? aspirin 325 MG tablet Take 325 mg by mouth daily. Reported on 07/30/2015  ? blood glucose meter kit and supplies KIT Dispense based on patient and insurance preference. Use up to four times daily as directed.  ? Blood Glucose Monitoring Suppl (ONE TOUCH ULTRA MINI) w/Device KIT Use to check blood sugar once a day, Dm2 E11.65  ?  canagliflozin (INVOKANA) 100 MG TABS tablet Take 1 tablet (100  mg total) by mouth daily before breakfast. (Patient not taking: Reported on 06/13/2021)  ? cefdinir (OMNICEF) 300 MG capsule Take 1 capsule (300 mg total) by mouth 2 (two) times daily. (Patient not taking: Reported on 05/06/2021)  ? gabapentin (NEURONTIN) 300 MG capsule   ? insulin detemir (LEVEMIR) 100 UNIT/ML injection Inject 0.8 mLs (80 Units total) into the skin 2 (two) times daily.  ? insulin glargine (LANTUS) 100 UNIT/ML injection INJECT 80 UNITS INTO THE SKIN 2 TIMES DAILY. PLEASE SCHEDULE OFFICE VISIT BEFORE ANY FUTURE REFILL.  ? Insulin Syringe 27G X 1/2" 1 ML MISC 1 Units by Does not apply route daily. For insulin dependent type 2 diabetes  ? methadone (DOLOPHINE) 10 MG tablet Limit 5-7 tabs by mouth per day if tolerated (Patient taking differently: Take 10 mg by mouth. Limit 5-7 tabs by mouth per day if tolerated)  ? methocarbamol (ROBAXIN) 500 MG tablet Take 1 tablet (500 mg total) by mouth every 8 (eight) hours as needed. (Patient not taking: Reported on 05/06/2021)  ? NARCAN 4 MG/0.1ML LIQD nasal spray kit  (Patient not taking: Reported on 05/06/2021)  ? NOVOLOG FLEXPEN 100 UNIT/ML FlexPen INJECT 0-20 UNITS 3 TIMES A DAY BEFORE MEALS PER SLIDING SCALE PER MD (Patient not taking: Reported on 06/07/2021)  ? oxyCODONE (OXY IR/ROXICODONE) 5 MG immediate release tablet Limit 4 - 6 tabs po / day for breakthrough pain while taking methadone if tolerated  ? pioglitazone (ACTOS) 15 MG tablet Take 1 tablet (15 mg total) by mouth daily. (Patient not taking: Reported on 05/06/2021)  ? ?No facility-administered medications prior to visit.  ? ? ?Review of Systems ? ?{Labs  Heme  Chem  Endocrine  Serology  Results Review (optional):23779} ?  Objective  ?  ?There were no vitals taken for this visit. ?{Show previous vital signs (optional):23777} ? ?Physical Exam  ?*** ? ?No results found for any visits on 09/02/21. ? Assessment & Plan  ?  ? ?*** ? ?No follow-ups on file.  ?   ? ?{provider attestation***:1} ? ? ?Lelon Huh, MD  ?[facility] ?[phone] (phone) ?[phone] (fax) ? ?[facility]  ?

## 2021-09-02 ENCOUNTER — Encounter: Payer: Self-pay | Admitting: Physician Assistant

## 2021-09-02 ENCOUNTER — Ambulatory Visit: Payer: Medicare Other | Admitting: Family Medicine

## 2021-09-02 ENCOUNTER — Ambulatory Visit (INDEPENDENT_AMBULATORY_CARE_PROVIDER_SITE_OTHER): Payer: Medicare Other | Admitting: Physician Assistant

## 2021-09-02 VITALS — BP 136/57 | HR 74 | Temp 98.1°F | Resp 14 | Wt 203.0 lb

## 2021-09-02 DIAGNOSIS — E119 Type 2 diabetes mellitus without complications: Secondary | ICD-10-CM

## 2021-09-02 DIAGNOSIS — E1165 Type 2 diabetes mellitus with hyperglycemia: Secondary | ICD-10-CM

## 2021-09-02 DIAGNOSIS — Z794 Long term (current) use of insulin: Secondary | ICD-10-CM | POA: Diagnosis not present

## 2021-09-02 LAB — POCT GLYCOSYLATED HEMOGLOBIN (HGB A1C): Hemoglobin A1C: 7.4 % — AB (ref 4.0–5.6)

## 2021-09-02 MED ORDER — INSULIN GLARGINE 100 UNIT/ML ~~LOC~~ SOLN: SUBCUTANEOUS | 0 refills | Status: DC

## 2021-09-02 NOTE — Progress Notes (Signed)
?  ? ? ?I,Chene Kasinger Robinson,acting as a Education administrator for [employer], [name].,have documented all relevant documentation on the behalf of [name], [name],as directed by  [employer], [name] while in the presence of [employer], [name]. ? ?Established patient visit ? ? ?[name]   DOB: 10/06/1960   61 y.o. Female  MRN: [id number] ?Visit Date: 09/02/2021 ? ?Today's healthcare provider: [name], [name]  ? ?Chief Complaint  ?Patient presents with  ? Diabetes  ? ?Subjective  ?  ? ?Diabetes Mellitus Type II, Follow-up ? ?Lab Results  ?Component Value Date  ? HGBA1C 11.8 (A) 05/06/2021  ? HGBA1C 8.7 (H) 05/05/2019  ? HGBA1C 8.4 09/28/2017  ? ?Wt Readings from Last 3 Encounters:  ?09/02/21 203 lb (92.1 kg)  ?05/06/21 191 lb (86.6 kg)  ?02/26/21 200 lb (90.7 kg)  ? ?Last seen for diabetes 3 months ago. SWITCH Lantus to [place] given high insulin burden  ?-SWITCH Invokana to [place] given CKD data and likelihood of PAP approval.  ?-PAP for Farxiga, Tresiba, and Novolog  ?-Recommend rosuvastatin 20 mg daily at next follow-up.  ?-Recommend starting Olmesartan 20 mg daily at next follow-up.  ?Management since then includes switch Lantus to [place] but reports it was to expensive and went back the lantus.  ?She reports good compliance with treatment. However, she is unclear what medication she is currently taking except Lantus.  ?She is not having side effects: none  ?Symptoms: ?No fatigue No foot ulcerations  ?No appetite changes No nausea  ?No paresthesia of the feet  No polydipsia  ?No polyuria No visual disturbances   ?No vomiting   ? ? ?Home blood sugar records: no machine at home per pt  ? ?Episodes of hypoglycemia? Yes  ?  ?Current insulin regiment:Patient is unsure which insulin she takes.  ?Most Recent Eye Exam: no  ?Current exercise: none ?Current diet habits: in general, a "healthy" diet   ? ?Pertinent Labs: ?Lab Results  ?Component Value Date  ? CHOL 176 05/27/2021  ? HDL 30 (L) 05/27/2021  ? [place] 118 (H)  05/27/2021  ? TRIG 157 (H) 05/27/2021  ? CHOLHDL 5.9 (H) 05/27/2021  ? Lab Results  ?Component Value Date  ? NA 142 05/27/2021  ? K 4.3 05/27/2021  ? CREATININE 0.80 05/27/2021  ? EGFR 83 05/27/2021  ? MICROALBUR 100 09/28/2017  ? LABMICR 664.0 05/27/2021  ?  ? ?-Medications: ?Outpatient Medications Prior to Visit  ?Medication Sig  ? amphetamine-dextroamphetamine (ADDERALL) 20 MG tablet   ? aspirin 325 MG tablet Take 325 mg by mouth daily. Reported on 07/30/2015  ? gabapentin (NEURONTIN) 300 MG capsule   ? insulin detemir (LEVEMIR) 100 UNIT/ML injection Inject 0.8 mLs (80 Units total) into the skin 2 (two) times daily.  ? insulin glargine (LANTUS) 100 UNIT/ML injection INJECT 80 UNITS INTO THE SKIN 2 TIMES DAILY. PLEASE SCHEDULE OFFICE VISIT BEFORE ANY FUTURE REFILL.  ? Insulin Syringe 27G X 1/2" 1 ML MISC 1 Units by Does not apply route daily. For insulin dependent type 2 diabetes  ? NARCAN 4 MG/0.1ML LIQD nasal spray kit   ? oxyCODONE (OXY IR/ROXICODONE) 5 MG immediate release tablet Limit 4 - 6 tabs po / day for breakthrough pain while taking methadone if tolerated  ? pioglitazone (ACTOS) 15 MG tablet Take 1 tablet (15 mg total) by mouth daily.  ? blood glucose meter kit and supplies KIT Dispense based on patient and insurance preference. Use up to four times daily as directed.  ? Blood  Glucose Monitoring Suppl (ONE TOUCH ULTRA MINI) w/Device KIT Use to check blood sugar once a day, Dm2 E11.65  ? canagliflozin (INVOKANA) 100 MG TABS tablet Take 1 tablet (100 mg total) by mouth daily before breakfast.  ? cefdinir (OMNICEF) 300 MG capsule Take 1 capsule (300 mg total) by mouth 2 (two) times daily.  ? methadone (DOLOPHINE) 10 MG tablet Limit 5-7 tabs by mouth per day if tolerated (Patient taking differently: Take 10 mg by mouth. Limit 5-7 tabs by mouth per day if tolerated)  ? methocarbamol (ROBAXIN) 500 MG tablet Take 1 tablet (500 mg total) by mouth every 8 (eight) hours as needed.  ? NOVOLOG FLEXPEN 100 UNIT/ML  FlexPen INJECT 0-20 UNITS 3 TIMES A DAY BEFORE MEALS PER SLIDING SCALE PER MD  ? ?No facility-administered medications prior to visit.  ? ? ?Review of Systems  ?Constitutional: Negative.   ?Respiratory: Negative.    ?Cardiovascular: Negative.   ?Except in HPI ? ?  Objective  ?  ?BP (!) 136/57 (BP Location: Right Arm, Patient Position: Sitting, Cuff Size: Large)   Pulse 74   Temp 98.1 ?F (36.7 ?C) (Oral)   Resp 14   Wt 203 lb (92.1 kg)   SpO2 98%   BMI 33.78 kg/m?  ? ? ?Physical Exam ?Vitals and nursing note reviewed.  ?Constitutional:   ?   Appearance: Normal appearance.  ?HENT:  ?   Head: Normocephalic and atraumatic.  ?Cardiovascular:  ?   Rate and Rhythm: Normal rate and regular rhythm.  ?   Pulses: Normal pulses.  ?Pulmonary:  ?   Effort: Pulmonary effort is normal.  ?Musculoskeletal:     ?   General: Normal range of motion.  ?Neurological:  ?   Mental Status: She is alert.  ?Psychiatric:     ?   Behavior: Behavior normal.     ?   Thought Content: Thought content normal.     ?   Judgment: Judgment normal.  ?  ? ? ?No results found for any visits on 09/02/21. ? Assessment & Plan  ?  ? ?1. Smoking cessation ?Strongly advised. ?- Will reassess at the next visit ? ?2. Type 2 diabetes mellitus with hyperglycemia, with long-term current use of insulin ([place]) ?POCT A1C today 7.4 vs 11.8 - 4 mo ago ?- insulin glargine (LANTUS) 100 UNIT/ML injection; INJECT 80 UNITS INTO THE SKIN 2 TIMES DAILY. PLEASE SCHEDULE OFFICE VISIT BEFORE ANY FUTURE REFILL.  Dispense: 10 mL; Refill: 0 ?- Advised to bring all medication to the next appt. Blood Glucose Mornitoring Suppl(one touch) with strips was provided  ?- Patient refused to take other BS medications due to cost including statin. ?- FU in 1 weeks to reassess medication adherence. ? ?3. HTN ?BP 136/57, at goal ?Advised to measure her BP at home or at local pharmacy and bring her BP log to the next visit. ?Advised to start taking BP medication but patient refused. ?   ?The  patient was advised to call back or seek an in-person evaluation if the symptoms worsen or if the condition fails to improve as anticipated. ? ?I discussed the assessment and treatment plan with the patient. The patient was provided an opportunity to ask questions and all were answered. The patient agreed with the plan and demonstrated an understanding of the instructions. ? ?The entirety of the information documented in the History of Present Illness, Review of Systems and Physical Exam were personally obtained by me. Portions of this information were initially documented  by the [place] and reviewed by me for thoroughness and accuracy.   ? ? ?[name], [name]  ?[place] ?[phone] (phone) ?[phone] (fax) ? ?[facility] ?

## 2021-09-04 ENCOUNTER — Encounter: Payer: Self-pay | Admitting: Physician Assistant

## 2021-09-09 ENCOUNTER — Ambulatory Visit: Payer: Medicare Other | Admitting: Physician Assistant

## 2021-09-09 NOTE — Progress Notes (Deleted)
?  ? ?I,Jana Larue Drawdy,acting as a Education administrator for [employer], [name].,have documented all relevant documentation on the behalf of [name], [name],as directed by  [employer], [name] while in the presence of [employer], [name]. ? ? ?Established patient visit ? ? ?[name]   DOB: 1960-09-24   61 y.o. Female  MRN: [id number] ?Visit Date: 09/09/2021 ? ?Today's healthcare provider: [name], [name]  ? ?No chief complaint on file. ? ?Subjective  ?  ?HPI  ?Diabetes Mellitus Type II, Follow-up ? ?Lab Results  ?Component Value Date  ? HGBA1C 7.4 (A) 09/02/2021  ? HGBA1C 11.8 (A) 05/06/2021  ? HGBA1C 8.7 (H) 05/05/2019  ? ?Wt Readings from Last 3 Encounters:  ?09/02/21 203 lb (92.1 kg)  ?05/06/21 191 lb (86.6 kg)  ?02/26/21 200 lb (90.7 kg)  ? ?Last seen for diabetes 1 weeks ago.  ?Management since then includes  insulin glargine (LANTUS) 100 UNIT/ML injection; INJECT 80 UNITS INTO THE SKIN 2 TIMES DAILY ?She reports {excellent/good/fair/poor:19665} compliance with treatment. ?She {is/is not:21021397} having side effects. {document side effects if present:1} ?Symptoms: ?{Yes/No:20286} fatigue {Yes/No:20286} foot ulcerations  ?{Yes/No:20286} appetite changes {Yes/No:20286} nausea  ?{Yes/No:20286} paresthesia of the feet  {Yes/No:20286} polydipsia  ?{Yes/No:20286} polyuria {Yes/No:20286} visual disturbances   ?{Yes/No:20286} vomiting   ? ? ?Home blood sugar records: {diabetes glucometry results:16657} ? ?Episodes of hypoglycemia? {Yes/No:20286} {enter symptoms and frequency of symptoms if yes:1} ?  ?Current insulin regiment: insulin glargine (LANTUS) 100 UNIT/ML injection; INJECT 80 UNITS INTO THE SKIN 2 TIMES DAILY ? Most Recent Eye Exam: *** ?{Current exercise:16438:::1} ?{Current diet habits:16563:::1} ? ?Pertinent Labs: ?Lab Results  ?Component Value Date  ? CHOL 176 05/27/2021  ? HDL 30 (L) 05/27/2021  ? [place] 118 (H) 05/27/2021  ? TRIG 157 (H) 05/27/2021  ? CHOLHDL 5.9 (H) 05/27/2021  ? Lab Results   ?Component Value Date  ? NA 142 05/27/2021  ? K 4.3 05/27/2021  ? CREATININE 0.80 05/27/2021  ? EGFR 83 05/27/2021  ? MICROALBUR 100 09/28/2017  ? LABMICR 664.0 05/27/2021  ?  ? ?---------------------------------------------------------------------------------------------------  ?3. HTN ?BP 136/57, at goal ?Advised to measure her BP at home or at local pharmacy and bring her BP log to this visit. ?Advised to start taking BP medication at last visit but patient refused. ? ?1. Smoking cessation ?Strongly advised. ?-To  reassess at this visit. ? ? ?Medications: ?Outpatient Medications Prior to Visit  ?Medication Sig  ? amphetamine-dextroamphetamine (ADDERALL) 20 MG tablet   ? aspirin 325 MG tablet Take 325 mg by mouth daily. Reported on 07/30/2015  ? blood glucose meter kit and supplies KIT Dispense based on patient and insurance preference. Use up to four times daily as directed.  ? Blood Glucose Monitoring Suppl (ONE TOUCH ULTRA MINI) w/Device KIT Use to check blood sugar once a day, Dm2 E11.65  ? canagliflozin (INVOKANA) 100 MG TABS tablet Take 1 tablet (100 mg total) by mouth daily before breakfast.  ? cefdinir (OMNICEF) 300 MG capsule Take 1 capsule (300 mg total) by mouth 2 (two) times daily.  ? gabapentin (NEURONTIN) 300 MG capsule   ? insulin glargine (LANTUS) 100 UNIT/ML injection INJECT 80 UNITS INTO THE SKIN 2 TIMES DAILY. PLEASE SCHEDULE OFFICE VISIT BEFORE ANY FUTURE REFILL.  ? Insulin Syringe 27G X 1/2" 1 ML MISC 1 Units by Does not apply route daily. For insulin dependent type 2 diabetes  ? methadone (DOLOPHINE) 10 MG tablet Limit 5-7 tabs by mouth per day if tolerated (Patient taking differently: Take  10 mg by mouth. Limit 5-7 tabs by mouth per day if tolerated)  ? methocarbamol (ROBAXIN) 500 MG tablet Take 1 tablet (500 mg total) by mouth every 8 (eight) hours as needed.  ? NARCAN 4 MG/0.1ML LIQD nasal spray kit   ? oxyCODONE (OXY IR/ROXICODONE) 5 MG immediate release tablet Limit 4 - 6 tabs po / day  for breakthrough pain while taking methadone if tolerated  ? pioglitazone (ACTOS) 15 MG tablet Take 1 tablet (15 mg total) by mouth daily.  ? ?No facility-administered medications prior to visit.  ? ? ?Review of Systems ? ?{Labs  Heme  Chem  Endocrine  Serology  Results Review (optional):23779} ?  Objective  ?  ?There were no vitals taken for this visit. ?{Show previous vital signs (optional):23777} ? ?Physical Exam  ?*** ? ?No results found for any visits on 09/09/21. ? Assessment & Plan  ?  ? ?*** ? ?No follow-ups on file.  ?   ? ?{provider attestation***:1} ? ? ?[name], [name]  ?[place] ?[phone] (phone) ?[phone] (fax) ? ?[facility] ?

## 2021-09-13 ENCOUNTER — Telehealth: Payer: Self-pay

## 2021-09-13 NOTE — Progress Notes (Signed)
? ? ?  Chronic Care Management ?Pharmacy Assistant  ? ?Name: Amy Meyers  MRN: [id number] DOB: [birth year] ? ?Reason for Encounter: Medication Review/General Adherence Call. ?  ?Recent office visits:  ?09/02/2021 [name] [name] (PCP Office) Per note patient refuse to take Diabetes medications due to cost, patient refuse to take hypertension medication, Follow up 1 week. ? ?Recent consult visits:  ?No recent consult visit ? ?Hospital visits:  ?None in previous 6 months ? ?Medications: ?Outpatient Encounter Medications as of 09/13/2021  ?Medication Sig  ? amphetamine-dextroamphetamine (ADDERALL) 20 MG tablet   ? aspirin 325 MG tablet Take 325 mg by mouth daily. Reported on 07/30/2015  ? blood glucose meter kit and supplies KIT Dispense based on patient and insurance preference. Use up to four times daily as directed.  ? Blood Glucose Monitoring Suppl (ONE TOUCH ULTRA MINI) w/Device KIT Use to check blood sugar once a day, Dm2 E11.65  ? canagliflozin (INVOKANA) 100 MG TABS tablet Take 1 tablet (100 mg total) by mouth daily before breakfast.  ? cefdinir (OMNICEF) 300 MG capsule Take 1 capsule (300 mg total) by mouth 2 (two) times daily.  ? gabapentin (NEURONTIN) 300 MG capsule   ? insulin glargine (LANTUS) 100 UNIT/ML injection INJECT 80 UNITS INTO THE SKIN 2 TIMES DAILY. PLEASE SCHEDULE OFFICE VISIT BEFORE ANY FUTURE REFILL.  ? Insulin Syringe 27G X 1/2" 1 ML MISC 1 Units by Does not apply route daily. For insulin dependent type 2 diabetes  ? methadone (DOLOPHINE) 10 MG tablet Limit 5-7 tabs by mouth per day if tolerated (Patient taking differently: Take 10 mg by mouth. Limit 5-7 tabs by mouth per day if tolerated)  ? methocarbamol (ROBAXIN) 500 MG tablet Take 1 tablet (500 mg total) by mouth every 8 (eight) hours as needed.  ? NARCAN 4 MG/0.1ML LIQD nasal spray kit   ? oxyCODONE (OXY IR/ROXICODONE) 5 MG immediate release tablet Limit 4 - 6 tabs po / day for breakthrough pain while taking methadone if tolerated  ?  pioglitazone (ACTOS) 15 MG tablet Take 1 tablet (15 mg total) by mouth daily.  ? ?No facility-administered encounter medications on file as of 09/13/2021.  ? ? ?Care Gaps: ?COVID 19 Vaccine ?Ophthalmology Exam  ?PAP smear- Modifier ?Colonoscopy ?Mammogram ?Shingrix Vaccine ? ?  ?Star Rating Drugs: ?Invokana 100 mg last filled 05/26/2019 for [facility] at [facility]. ?Pioglitazone 15 mg not on fill report ?  ?Medication Fill Gaps: ?Novolog  FLEXPEN 100 UNIT/ML last filled 11/04/2020 for 25 day supply. ? ?Called patient and discussed medication adherence  with patient, no issues at this time with current medication. ? ? Patient Denies ED visit since her last CPP follow up.  ?Patient Denies  any side effects with her medication. ?Patient Denies  any problems with hercurrent pharmacy ? ?Patient reports she was finally able to get her Lantus cover through her insurance, and prefers not to change to another insulin as it would cost her $150.00. ? ?Telephone follow up appointment with Care management team member scheduled for : 10/04/2021 at 3:45 pm.  ? ?Anderson Malta ?Clinical Pharmacist Assistant ?[phone]  ? ?

## 2021-09-14 DIAGNOSIS — M259 Joint disorder, unspecified: Secondary | ICD-10-CM | POA: Diagnosis not present

## 2021-09-14 DIAGNOSIS — Z79891 Long term (current) use of opiate analgesic: Secondary | ICD-10-CM | POA: Diagnosis not present

## 2021-09-14 DIAGNOSIS — M5136 Other intervertebral disc degeneration, lumbar region: Secondary | ICD-10-CM | POA: Diagnosis not present

## 2021-09-14 DIAGNOSIS — G894 Chronic pain syndrome: Secondary | ICD-10-CM | POA: Diagnosis not present

## 2021-09-21 ENCOUNTER — Other Ambulatory Visit: Payer: Self-pay | Admitting: Family Medicine

## 2021-09-21 DIAGNOSIS — E1165 Type 2 diabetes mellitus with hyperglycemia: Secondary | ICD-10-CM

## 2021-09-21 NOTE — Telephone Encounter (Signed)
Medication Refill - Medication: insulin glargine (LANTUS) 100 UNIT/ML injection  ? ?Pt is completely out of her current supply  ? ?Has the patient contacted their pharmacy? Yes.   ?(Agent: If no, request that the patient contact the pharmacy for the refill. If patient does not wish to contact the pharmacy document the reason why and proceed with request.) ?(Agent: If yes, when and what did the pharmacy advise?) ? ?Preferred Pharmacy (with phone number or street name):  ?[id number] [place] - [address]  ?[address] [id number] ?Phone: [phone]: [phone] ? ?Has the patient been seen for an appointment in the last year OR does the patient have an upcoming appointment? Yes.   ? ?Agent: Please be advised that RX refills may take up to 3 business days. We ask that you follow-up with your pharmacy. ? ?

## 2021-09-22 ENCOUNTER — Other Ambulatory Visit: Payer: Self-pay | Admitting: Family Medicine

## 2021-09-22 DIAGNOSIS — E1165 Type 2 diabetes mellitus with hyperglycemia: Secondary | ICD-10-CM

## 2021-09-22 MED ORDER — INSULIN GLARGINE 100 UNIT/ML ~~LOC~~ SOLN: SUBCUTANEOUS | 1 refills | Status: DC

## 2021-09-22 NOTE — Telephone Encounter (Signed)
Requested Prescriptions  ?Pending Prescriptions Disp Refills  ?? insulin glargine (LANTUS) 100 UNIT/ML injection 10 mL 1  ?  Sig: INJECT 80 UNITS INTO THE SKIN 2 TIMES DAILY. PLEASE SCHEDULE OFFICE VISIT BEFORE ANY FUTURE REFILL.  ?  ? Endocrinology:  Diabetes - Insulins Passed - 09/22/2021 10:10 AM  ?  ?  Passed - HBA1C is between 0 and 7.9 and within 180 days  ?  Hemoglobin A1C  ?Date Value Ref Range Status  ?09/02/2021 7.4 (A) 4.0 - 5.6 % Final  ? ?Hgb A1c MFr Bld  ?Date Value Ref Range Status  ?05/05/2019 8.7 (H) 4.8 - 5.6 % Final  ?  Comment:  ?           Prediabetes: 5.7 - 6.4 ?         Diabetes: >6.4 ?         Glycemic control for adults with diabetes: <7.0 ?  ?   ?  ?  Passed - Valid encounter within last 6 months  ?  Recent Outpatient Visits   ?      ? 2 weeks ago Diabetes mellitus without complication ([place])  ? [facility] [place], [place], [name]  ? 4 months ago Type 2 diabetes mellitus with hyperglycemia, with long-term current use of insulin ([place])  ? [place], [name]  ? 2 years ago Type 2 diabetes mellitus with diabetic neuropathy, with long-term current use of insulin ([place])  ? [facility] [place], Kirstie Peri, MD  ? 3 years ago Type 2 diabetes mellitus with diabetic neuropathy, with long-term current use of insulin ([place])  ? [facility], Kirstie Peri, MD  ? 4 years ago Type 2 diabetes mellitus with diabetic neuropathy, with long-term current use of insulin ([place])  ? [facility], Kirstie Peri, MD  ?  ?  ? ?  ?  ?  ? ?

## 2021-09-25 NOTE — Telephone Encounter (Signed)
Please contact patient about Lantus prescription. Pharmacy sent a note stating that the vials are not covered and wanted to change solostart pens or Levemir vials. But I think patent said ar her last office visit that she has to have Lantus vials. Please see what the deal is.  ?

## 2021-09-27 NOTE — Telephone Encounter (Signed)
LMTCB-Ok for PEC to relay providers message to patient ?

## 2021-10-03 ENCOUNTER — Telehealth: Payer: Self-pay

## 2021-10-03 NOTE — Telephone Encounter (Signed)
Requested medication (s) are due for refill today: no ? ?Requested medication (s) are on the active medication list: yes ? ?Last refill:  last week ? ?Future visit scheduled: no ? ?Notes to clinic:   ?Pharmacy comment: Alternative Requested:THE PRESCRIBED MEDICATION IS NOT COVERED BY INSURANCE. PLEASE CONSIDER CHANGING TO ONE OF THE SUGGESTED COVERED ALTERNATIVES.   ?All Pharmacy Suggested Alternatives:   ?insulin glargine (LANTUS SOLOSTAR) 100 UNIT/ML Solostar Pen ?insulin detemir (LEVEMIR FLEXTOUCH) 100 UNIT/ML FlexPen ?insulin detemir (LEVEMIR) 100 UNIT/ML injection ?insulin degludec (TRESIBA FLEXTOUCH) 100 UNIT/ML FlexTouch Pen ?insulin glargine, 1 Unit Dial, (TOUJEO SOLOSTAR) 300 UNIT/ML Solostar Pen ?Insulin Degludec (TRESIBA) 100 UNIT/ML SOLN  ? ? ? ?  ?Requested Prescriptions  ?Pending Prescriptions Disp Refills  ? LANTUS SOLOSTAR 100 UNIT/ML Solostar Pen [Pharmacy Med Name: LANTUS SOLOSTAR 100 UNIT/ML]  0  ?  ? Endocrinology:  Diabetes - Insulins Passed - 10/03/2021  1:16 PM  ?  ?  Passed - HBA1C is between 0 and 7.9 and within 180 days  ?  Hemoglobin A1C  ?Date Value Ref Range Status  ?09/02/2021 7.4 (A) 4.0 - 5.6 % Final  ? ?Hgb A1c MFr Bld  ?Date Value Ref Range Status  ?05/05/2019 8.7 (H) 4.8 - 5.6 % Final  ?  Comment:  ?           Prediabetes: 5.7 - 6.4 ?         Diabetes: >6.4 ?         Glycemic control for adults with diabetes: <7.0 ?  ?  ?  ?  ?  Passed - Valid encounter within last 6 months  ?  Recent Outpatient Visits   ? ?      ? 1 month ago Diabetes mellitus without complication ([place])  ? [facility] [place], [place], [name]  ? 5 months ago Type 2 diabetes mellitus with hyperglycemia, with long-term current use of insulin ([place])  ? Staples, [name]  ? 2 years ago Type 2 diabetes mellitus with diabetic neuropathy, with long-term current use of insulin ([place])  ? [facility] Fisher, Kirstie Peri, MD  ? 4 years ago Type 2 diabetes mellitus with diabetic  neuropathy, with long-term current use of insulin ([place])  ? [facility] Fisher, Kirstie Peri, MD  ? 4 years ago Type 2 diabetes mellitus with diabetic neuropathy, with long-term current use of insulin ([place])  ? [facility] Caryn Section, Kirstie Peri, MD  ? ?  ?  ? ? ?  ?  ?  ? ?

## 2021-10-03 NOTE — Telephone Encounter (Signed)
See Fisher's message below. Tried calling patient earlier today. Left message to call back. OK for [facility] triage to speak with patient.  ?

## 2021-10-03 NOTE — Telephone Encounter (Signed)
Tried calling patient. Left message to call back. OK for [facility] triage to speak with patient.  

## 2021-10-03 NOTE — Telephone Encounter (Signed)
Requested medication (s) are due for refill today - no ? ?Requested medication (s) are on the active medication list -yes ? ?Future visit scheduled -no ? ?Last refill: 09/22/21 16m 1RF ? ?Notes to clinic: Pharmacy request alternative: ?All Pharmacy Suggested Alternatives:   ?insulin glargine (LANTUS SOLOSTAR) 100 UNIT/ML Solostar Pen ?insulin detemir (LEVEMIR FLEXTOUCH) 100 UNIT/ML FlexPen ?insulin detemir (LEVEMIR) 100 UNIT/ML injection ?insulin degludec (TRESIBA FLEXTOUCH) 100 UNIT/ML FlexTouch Pen ?insulin glargine, 1 Unit Dial, (TOUJEO SOLOSTAR) 300 UNIT/ML Solostar Pen ?Insulin Degludec (TRESIBA) 100 UNIT/ML SOLN  ?To prescribe one of the alternatives listed above, open the encounter and click Replace  ? ? ?Requested Prescriptions  ?Pending Prescriptions Disp Refills  ? LANTUS SOLOSTAR 100 UNIT/ML Solostar Pen [Pharmacy Med Name: LANTUS SOLOSTAR 100 UNIT/ML]  0  ?  ? Endocrinology:  Diabetes - Insulins Passed - 10/03/2021  3:01 PM  ?  ?  Passed - HBA1C is between 0 and 7.9 and within 180 days  ?  Hemoglobin A1C  ?Date Value Ref Range Status  ?09/02/2021 7.4 (A) 4.0 - 5.6 % Final  ? ?Hgb A1c MFr Bld  ?Date Value Ref Range Status  ?05/05/2019 8.7 (H) 4.8 - 5.6 % Final  ?  Comment:  ?           Prediabetes: 5.7 - 6.4 ?         Diabetes: >6.4 ?         Glycemic control for adults with diabetes: <7.0 ?  ?  ?  ?  ?  Passed - Valid encounter within last 6 months  ?  Recent Outpatient Visits   ? ?      ? 1 month ago [place] without [place] ([place]  ? [name]  ? 5 months ago Type 2 diabetes mellitus with hyperglycemia, with long-term current use of insulin ([place]  ? BB[place] [name]  ? 2 years ago Type 2 diabetes mellitus with diabetic neuropathy, with long-term current use of insulin ([place]  ? [facility]  ? 4 years ago Type 2 diabetes mellitus with diabetic neuropathy, with long-term current use of  insulin ([place]  ? [facility] D[facility]irst[facility] Peri [facility]  ? 4 years ago Type 2 diabetes mellitus with diabetic neuropathy, with long-term current use of insulin ([place]  ? [facility] D[facility]irstie Peri [facility]  ? ?  ?  ? ? ?  ?  ?  ? ? ? ?Requested Prescriptions  ?Pending Prescriptions Disp Refills  ? LANTUS SOLOSTAR 100 UNIT/ML Solostar Pen [Pharmacy Med Name: LANTUS SOLOSTAR 100 UNIT/ML]  0  ?  ? Endocrinology:  Diabetes - Insulins Passed - 10/03/2021  3:01 PM  ?  ?  Passed - HBA1C is between 0 and 7.9 and within 180 days  ?  Hemoglobin A1C  ?Date Value Ref Range Status  ?09/02/2021 7.4 (A) 4.0 - 5.6 % Final  ? ?Hgb A1c MFr Bld  ?Date Value Ref Range Status  ?05/05/2019 8.7 (H) 4.8 - 5.6 % Final  ?  Comment:  ?           Prediabetes: 5.7 - 6.4 ?         Diabetes: >6.4 ?         Glycemic control for adults with diabetes: <7.0 ?  ?  ?  ?  ?  Passed - Valid encounter within last 6 months  ?  Recent Outpatient Visits   ? ?      ?  1 month ago Diabetes mellitus without complication ([place])  ? [facility] [place], [place], [name]  ? 5 months ago Type 2 diabetes mellitus with hyperglycemia, with long-term current use of insulin ([place])  ? [place], [name]  ? 2 years ago Type 2 diabetes mellitus with diabetic neuropathy, with long-term current use of insulin ([place])  ? [facility] Fisher, Kirstie Peri, MD  ? 4 years ago Type 2 diabetes mellitus with diabetic neuropathy, with long-term current use of insulin ([place])  ? [facility] Fisher, Kirstie Peri, MD  ? 4 years ago Type 2 diabetes mellitus with diabetic neuropathy, with long-term current use of insulin ([place])  ? [facility] Caryn Section, Kirstie Peri, MD  ? ?  ?  ? ? ?  ?  ?  ? ? ? ?

## 2021-10-03 NOTE — Progress Notes (Signed)
Chronic Care Management APPOINTMENT REMINDER ? ? ?Amy Meyers was reminded to have all medications, supplements and any blood glucose and blood pressure readings available for review with Junius Argyle, Pharm. D, at her telephone visit on 10/04/2021 at 3:45 pm. ? ? ?Patient Confirm Appointment. ? ?Anderson Malta ?Clinical Pharmacist Assistant ?[phone]  ? ?

## 2021-10-04 ENCOUNTER — Ambulatory Visit (INDEPENDENT_AMBULATORY_CARE_PROVIDER_SITE_OTHER): Payer: Medicare Other

## 2021-10-04 DIAGNOSIS — R809 Proteinuria, unspecified: Secondary | ICD-10-CM

## 2021-10-04 MED ORDER — DAPAGLIFLOZIN PROPANEDIOL 10 MG PO TABS: 10.0000 mg | ORAL_TABLET | Freq: Every day | ORAL | 0 refills | Status: DC

## 2021-10-04 MED ORDER — BLOOD GLUCOSE MONITOR KIT: PACK | 0 refills | Status: DC

## 2021-10-04 NOTE — Progress Notes (Signed)
? ?Chronic Care Management ?Pharmacy Note ? ?10/04/2021 ?Name:  Amy Meyers MRN:  [id number] DOB:  10-20-1960 ? ?Summary: ?Patient presents for CCM follow-up.  ?-Discussed benefits of SGLT-2 inhibitor in preventing CKD, patient was amenable.  ? ?Recommendations/Changes made from today's visit: ?-START Farxiga 10 mg daily, 30-day voucher left at front desk for patient to take to pharmacy.  ? ?Plan: ?-HC assessment in one week to ensure voucher picked up and medication started.  ?CPP follow-up 1 month  ? ?Subjective: ?Amy Meyers is an 61 y.o. year old female who is a primary patient of Fisher, Kirstie Peri, MD.  The CCM team was consulted for assistance with disease management and care coordination needs.   ? ?Engaged with patient by telephone for follow up visit in response to provider referral for pharmacy case management and/or care coordination services.  ? ?Consent to Services:  ?The patient was given information about Chronic Care Management services, agreed to services, and gave verbal consent prior to initiation of services.  Please see initial visit note for detailed documentation.  ? ?Patient Care Team: ?Birdie Sons, MD as PCP - General (Family Medicine) ?Mohammed Kindle, MD as Attending Physician (Pain Medicine) ?Samara Deist, DPM as Referring Physician (Podiatry) ?Germaine Pomfret, [facility] (Pharmacist) ? ?Recent office visits: ?09/02/21: Patient presented to [name], [name] for follow-up.  ? ?05/06/2021 Erin Mecum [name] (PCP Office) Change Lantus to 0.8 mLs (80 units total) into the skin 2 times daily, AMB Referral to [facility] Coordination,follow up in 3 months  ? ?Recent consult visits: ?No recent consult visit ? ?Hospital visits: ?None noted in past 6 months.  ? ? ?Objective: ? ?Lab Results  ?Component Value Date  ? CREATININE 0.80 05/27/2021  ? BUN 11 05/27/2021  ? GFRNONAA >60 06/23/2020  ? GFRAA 100 05/05/2019  ? NA 142 05/27/2021  ? K 4.3 05/27/2021  ? CALCIUM 9.1 05/27/2021  ? CO2 29 05/27/2021   ? GLUCOSE 223 (H) 05/27/2021  ? ? ?Lab Results  ?Component Value Date/Time  ? HGBA1C 7.4 (A) 09/02/2021 04:21 PM  ? HGBA1C 11.8 (A) 05/06/2021 11:49 AM  ? HGBA1C 8.7 (H) 05/05/2019 03:48 PM  ? HGBA1C 6.7 (A) 01/28/2014 12:00 AM  ? MICROALBUR 100 09/28/2017 12:07 PM  ?  ?Last diabetic Eye exam: No results found for: HMDIABEYEEXA  ?Last diabetic Foot exam: No results found for: HMDIABFOOTEX  ? ?Lab Results  ?Component Value Date  ? CHOL 176 05/27/2021  ? HDL 30 (L) 05/27/2021  ? [place] 118 (H) 05/27/2021  ? TRIG 157 (H) 05/27/2021  ? CHOLHDL 5.9 (H) 05/27/2021  ? ? ? ?  Latest Ref Rng & Units 05/27/2021  ?  2:07 PM 05/05/2019  ?  3:48 PM 09/28/2017  ? 12:39 PM  ?Hepatic Function  ?Total Protein 6.0 - 8.5 g/dL 7.1   6.9   7.3    ?Albumin 3.8 - 4.8 g/dL 4.0   3.7   4.0    ?AST 0 - 40 IU/L '11   11   16    ' ?ALT 0 - 32 IU/L '12   8   19    ' ?Alk Phosphatase 44 - 121 IU/L 84   73   73    ?Total Bilirubin 0.0 - 1.2 mg/dL 0.5   0.3   0.4    ? ? ?Lab Results  ?Component Value Date/Time  ? TSH 1.770 05/05/2019 03:48 PM  ? ? ? ?  Latest Ref Rng & Units 05/27/2021  ?  2:07 PM 06/23/2020  ?  2:03 PM 09/28/2017  ? 12:39 PM  ?CBC  ?WBC 3.4 - 10.8 x10E3/uL 11.4   9.9   10.7    ?Hemoglobin 11.1 - 15.9 g/dL 15.1   15.5   15.1    ?Hematocrit 34.0 - 46.6 % 44.7   47.2   45.1    ?Platelets 150 - 450 x10E3/uL 192   164   176    ? ? ?No results found for: VD25OH ? ?Clinical ASCVD: No  ?The 10-year ASCVD risk score (Arnett DK, et al., 2019) is: 28.9% ?  Values used to calculate the score: ?    Age: 35 years ?    Sex: Female ?    Is Non-Hispanic African American: No ?    Diabetic: Yes ?    Tobacco smoker: Yes ?    Systolic Blood Pressure: 952 mmHg ?    Is BP treated: Yes ?    HDL Cholesterol: 30 mg/dL ?    Total Cholesterol: 176 mg/dL   ? ? ?  09/02/2021  ?  3:57 PM 05/05/2019  ?  3:40 PM 06/23/2016  ? 11:16 AM  ?Depression screen PHQ 2/9  ?Decreased Interest 1 3 0  ?Down, Depressed, Hopeless '2 3 3  ' ?PHQ - 2 Score '3 6 3  ' ?Altered sleeping '1 1 1   ' ?Tired, decreased energy '1 3 3  ' ?Change in appetite '2 3 1  ' ?Feeling bad or failure about yourself  '2 3 3  ' ?Trouble concentrating 2 3 0  ?Moving slowly or fidgety/restless 0 1 0  ?Suicidal thoughts '2 1 1  ' ?PHQ-9 Score '13 21 12  ' ?Difficult doing work/chores Very difficult Very difficult Somewhat difficult  ?  ?Social History  ? ?Tobacco Use  ?Smoking Status Every Day  ? Packs/day: 1.00  ? Years: 44.00  ? Pack years: 44.00  ? Types: Cigarettes  ?Smokeless Tobacco Former  ? Types: Chew  ? ?BP Readings from Last 3 Encounters:  ?09/02/21 (!) 136/57  ?05/06/21 140/68  ?06/23/20 (!) 135/46  ? ?Pulse Readings from Last 3 Encounters:  ?09/02/21 74  ?05/06/21 88  ?02/26/21 82  ? ?Wt Readings from Last 3 Encounters:  ?09/02/21 203 lb (92.1 kg)  ?05/06/21 191 lb (86.6 kg)  ?02/26/21 200 lb (90.7 kg)  ? ?BMI Readings from Last 3 Encounters:  ?09/02/21 33.78 kg/m?  ?05/06/21 31.78 kg/m?  ?02/26/21 33.28 kg/m?  ? ? ?Assessment/Interventions: Review of patient past medical history, allergies, medications, health status, including review of consultants reports, laboratory and other test data, was performed as part of comprehensive evaluation and provision of chronic care management services.  ? ?SDOH:  (Social Determinants of Health) assessments and interventions performed: Yes ? ? ?SDOH Screenings  ? ?Alcohol Screen: Low Risk   ? Last Alcohol Screening Score (AUDIT): 0  ?Depression (PHQ2-9): Medium Risk  ? PHQ-2 Score: 13  ?Financial Resource Strain: High Risk  ? Difficulty of Paying Living Expenses: Very hard  ?Food Insecurity: Not on file  ?Housing: Not on file  ?Physical Activity: Not on file  ?Social Connections: Not on file  ?Stress: Not on file  ?Tobacco Use: High Risk  ? Smoking Tobacco Use: Every Day  ? Smokeless Tobacco Use: Former  ? Passive Exposure: Not on file  ?Transportation Needs: Not on file  ? ? ?[place] ? ?Allergies  ?Allergen Reactions  ? Meloxicam Itching, Swelling and Nausea And Vomiting  ? Sulfa  Antibiotics Other (See Comments)  ? ? ?Medications  Reviewed Today   ? ? Reviewed by Alanson Puls, CMA (Certified Medical Assistant) on 09/02/21 at 1548  Med List Status: <None>  ? ?Medication Order Taking? Sig Documenting Provider Last Dose Status Informant  ?amphetamine-dextroamphetamine (ADDERALL) 20 MG tablet [id number] Yes  [provider] Taking Active   ?aspirin 325 MG tablet [id number] Yes Take 325 mg by mouth daily. Reported on 07/30/2015 [provider] Taking Active Self  ?blood glucose meter kit and supplies KIT [id number] No Dispense based on patient and insurance preference. Use up to four times daily as directed. Birdie Sons, MD Unknown Active   ?Blood Glucose Monitoring Suppl (ONE TOUCH ULTRA MINI) w/Device KIT [id number] No Use to check blood sugar once a day, Dm2 E11.65 Mecum, Erin E, [name] Unknown Active   ?canagliflozin (INVOKANA) 100 MG TABS tablet [id number] No Take 1 tablet (100 mg total) by mouth daily before breakfast. Mecum, Erin E, [name] Unknown Active   ?cefdinir (OMNICEF) 300 MG capsule [id number] No Take 1 capsule (300 mg total) by mouth 2 (two) times daily. Versie Starks, [name] Unknown Active   ?gabapentin (NEURONTIN) 300 MG capsule [id number] Yes  [provider] Taking Active   ?insulin detemir (LEVEMIR) 100 UNIT/ML injection [id number] Yes Inject 0.8 mLs (80 Units total) into the skin 2 (two) times daily. Birdie Sons, MD Taking Active   ?insulin glargine (LANTUS) 100 UNIT/ML injection [id number] Yes INJECT 80 UNITS INTO THE SKIN 2 TIMES DAILY. PLEASE SCHEDULE OFFICE VISIT BEFORE ANY FUTURE REFILL. Birdie Sons, MD Taking Active   ?Insulin Syringe 27G X 1/2" 1 ML MISC [id number] Yes 1 Units by Does not apply route daily. For insulin dependent type 2 diabetes Birdie Sons, MD Taking Active   ?methadone (DOLOPHINE) 10 MG tablet [id number] No Limit 5-7 tabs by mouth per day if tolerated  ?Patient taking differently: Take 10 mg by mouth. Limit 5-7 tabs  by mouth per day if tolerated  ? Mohammed Kindle, MD Unknown Active   ?methocarbamol (ROBAXIN) 500 MG tablet [id number] No Take 1 tablet (500 mg total) by mouth every 8 (eight) hours as needed. Johnn Hai, [place]

## 2021-10-05 ENCOUNTER — Other Ambulatory Visit: Payer: Self-pay | Admitting: *Deleted

## 2021-10-05 DIAGNOSIS — E1165 Type 2 diabetes mellitus with hyperglycemia: Secondary | ICD-10-CM

## 2021-10-05 MED ORDER — LANTUS SOLOSTAR 100 UNIT/ML ~~LOC~~ SOPN: 80.0000 [IU] | PEN_INJECTOR | Freq: Two times a day (BID) | SUBCUTANEOUS | 1 refills | Status: DC

## 2021-10-05 NOTE — Progress Notes (Signed)
Rx marked as print- did not transmit- unable to change- forwarded to office for correction. ?

## 2021-10-05 NOTE — Progress Notes (Signed)
Rx failed to transmit ?

## 2021-10-05 NOTE — Patient Instructions (Signed)
Visit Information ?It was great speaking with you today!  Please let me know if you have any questions about our visit. ? ? Goals Addressed   ? ?  ?  ?  ?  ? This Visit's Progress  ?  Monitor and Manage My Blood Sugar-Diabetes Type 2   Not on track  ?  Timeframe:  Long-Range Goal ?Priority:  High ?Start Date:  06/07/2021                           ?Expected End Date: 06/13/2022                     ? ?Follow Up within 30 days ?  ?- check blood sugar daily before breakfast ?- check blood sugar if I feel it is too high or too low ?- enter blood sugar readings and medication or insulin into daily log ?- take the blood sugar log to all doctor visits  ?  ?Why is this important?   ?Checking your blood sugar at home helps to keep it from getting very high or very low.  ?Writing the results in a diary or log helps the doctor know how to care for you.  ?Your blood sugar log should have the time, date and the results.  ?Also, write down the amount of insulin or other medicine that you take.  ?Other information, like what you ate, exercise done and how you were feeling, will also be helpful.   ?  ?Notes:  ?  ? ?  ? ? ?Patient Care Plan: General Pharmacy (Adult)  ?  ? ?Problem Identified: Hypertension, Hyperlipidemia, Diabetes, Anxiety, and ADHD, Chronic Pain   ?Priority: High  ?  ? ?Long-Range Goal: Patient-Specific Goal   ?Start Date: 06/13/2021  ?Expected End Date: 06/13/2022  ?This Visit's Progress: On track  ?Recent Progress: On track  ?Priority: High  ?Note:   ?Current Barriers:  ?Unable to independently afford treatment regimen ?Unable to achieve control of diabetes  ?Suboptimal therapeutic regimen for HTN, HLD ? ?Pharmacist Clinical Goal(s):  ?Patient will verbalize ability to afford treatment regimen ?achieve control of Diabetes as evidenced by A1c less than 7% ?adhere to plan to optimize therapeutic regimen for HTN, HLD as evidenced by report of adherence to recommended medication management changes through collaboration  with PharmD and provider.  ? ?Interventions: ?1:1 collaboration with Birdie Sons, MD regarding development and update of comprehensive plan of care as evidenced by provider attestation and co-signature ?Inter-disciplinary care team collaboration (see longitudinal plan of care) ?Comprehensive medication review performed; medication list updated in electronic medical record ? ?Hypertension (BP goal <130/80) ?-Uncontrolled ?-Current treatment: ?None ?-Medications previously tried: Lisinopril, Carvedilol   ?-Current home readings: Does not monitor at home.  ?-Would recommend ACEi or ARB given history of microalbuminuria ?-Continue current medications for now ? ?Hyperlipidemia: (LDL goal < 70) ?-Uncontrolled ?-Current treatment: ?Aspirin 81 mg daily  ?-Medications previously tried: NA  ?-inconsistently taking  ?-Patient is a candidate for high intensity statin given T2DM and ASCVD risk > 20%. unable to discuss with patient prior to phone disconnecting. Will discuss at next follow-up.  ?-Recommend rosuvastatin 20 mg daily at next follow-up.  ? ?Diabetes (A1c goal <7%) ?-Uncontrolled, but significantly improved.  ?-Diagnosed 29+ years,  ?-Complicated by: Neuropathy, Microalbuminuria, ?-Current medications: ?Lantus 80 units twice daily (1.84 u/kg): Appropriate, Effective, Query safe ?-Medications previously tried: Metformin ?-Current home glucose readings ?fasting glucose: does not have a glucometer.  Replacement sent in today.  ?-Denies hypoglycemic symptoms ?-Current meal patterns:  ?breakfast: Typically skips  ?lunch: Pimento Cheese Sandwich   ?dinner: Microwavable meals ?drinks: Coffee, water, diet soda.  ?-Current exercise: Minimal ?-Discussed benefits of SGLT-2 inhibitor in preventing CKD, patient was amenable.  ?-START Farxiga 10 mg daily, 30-day voucher left at front desk for patient to take to pharmacy.  ?-HC assessment in one week to ensure voucher picked up and medication started.  ? ?Anxiety/ ADHD (Goal:  Minimize symptoms) ?-Controlled ?-Managed by Myer Haff, MD ?-Current treatment: ?Adderrall 20 mg: Appropriate, Effective, Query Safe ?-Medications previously tried/failed: NA ?-Discussed impact of stimulants on blood pressure. ?-Recommended to continue current medication ? ?Chronic Pain (Goal: Minimize pain symptoms) ?-Controlled ?-Managed by Mohammed Kindle, MD ?-Current treatment  ?Gabapentin 400 mg nightly as needed: Appropriate, Query effective ?Methadone 10 mg: Appropriate, Query effective ?Oxycodone 5 mg: Appropriate, Query effective  ?-Medications previously tried: NA ?-Unable to discuss pain management prior to phone call disconnecting.   ?-Recommended to continue current medication ? ?Patient Goals/Self-Care Activities ?Patient will:  ?- check glucose daily before breakfast, document, and provide at future appointments ?check blood pressure weekly, document, and provide at future appointments ? ?Follow Up Plan: Face to Face appointment with care management team member scheduled for: 11/07/2021 at 1:00 PM ?  ? ?Patient agreed to services and verbal consent obtained.  ? ?The patient verbalized understanding of instructions, educational materials, and care plan provided today and declined offer to receive copy of patient instructions, educational materials, and care plan.  ? ?Junius Argyle, PharmD, BCACP, CPP  ?Clinical Pharmacist Practitioner  ?[place] ?[phone]  ?

## 2021-10-06 ENCOUNTER — Other Ambulatory Visit: Payer: Self-pay

## 2021-10-06 ENCOUNTER — Telehealth: Payer: Self-pay

## 2021-10-06 ENCOUNTER — Other Ambulatory Visit: Payer: Self-pay | Admitting: Family Medicine

## 2021-10-06 DIAGNOSIS — E1165 Type 2 diabetes mellitus with hyperglycemia: Secondary | ICD-10-CM

## 2021-10-06 DIAGNOSIS — Z794 Long term (current) use of insulin: Secondary | ICD-10-CM

## 2021-10-06 MED ORDER — DAPAGLIFLOZIN PROPANEDIOL 10 MG PO TABS: 10.0000 mg | ORAL_TABLET | Freq: Every day | ORAL | 3 refills | Status: DC

## 2021-10-06 MED ORDER — LANTUS SOLOSTAR 100 UNIT/ML ~~LOC~~ SOPN: 80.0000 [IU] | PEN_INJECTOR | Freq: Two times a day (BID) | SUBCUTANEOUS | 1 refills | Status: DC

## 2021-10-06 NOTE — Addendum Note (Signed)
Addended by: Daron Offer A on: 10/06/2021 12:41 PM ? ? Modules accepted: Orders ? ?

## 2021-10-06 NOTE — Progress Notes (Signed)
Chronic Care Management Pharmacy Assistant   Name: Amy Meyers  MRN: [id number] DOB: 07-21-1960  Reason for Encounter: Medication Review/Patient Assistance Application for [place].   Recent office visits:  No recent office visit  Recent consult visits:  No recent consult visit  Hospital visits:  None in previous 6 months  Medications: Outpatient Encounter Medications as of 10/06/2021  Medication Sig   amphetamine-dextroamphetamine (ADDERALL) 20 MG tablet Take 20 mg by mouth daily.   Aspirin 81 MG CAPS Take 81 mg by mouth daily. Reported on 07/30/2015   blood glucose meter kit and supplies KIT Dispense based on patient and insurance preference. Use to monitor blood sugars once daily as directed.   dapagliflozin propanediol (FARXIGA) 10 MG TABS tablet Take 1 tablet (10 mg total) by mouth daily before breakfast.   gabapentin (NEURONTIN) 400 MG capsule Take 400 mg by mouth at bedtime as needed.   insulin glargine (LANTUS SOLOSTAR) 100 UNIT/ML Solostar Pen Inject 80 Units into the skin 2 (two) times daily.   Insulin Syringe 27G X 1/2" 1 ML MISC 1 Units by Does not apply route daily. For insulin dependent type 2 diabetes   methadone (DOLOPHINE) 10 MG tablet Limit 5-7 tabs by mouth per day if tolerated (Patient taking differently: Take 10 mg by mouth. Limit 5-7 tabs by mouth per day if tolerated)   NARCAN 4 MG/0.1ML LIQD nasal spray kit    oxyCODONE (OXY IR/ROXICODONE) 5 MG immediate release tablet Limit 4 - 6 tabs po / day for breakthrough pain while taking methadone if tolerated   No facility-administered encounter medications on file as of 10/06/2021.    Care Gaps: COVID 42 Vaccine Ophthalmology Exam  PAP smear- Modifier Colonoscopy Mammogram Shingrix Vaccine     Star Rating Drugs: Farxiga 10 mg not on fill report   Medication Fill Gaps: None ID  I received a task from Junius Argyle, CPP requesting that I start the application for patient assistance on the medication Farxiga.     Spoke with the patient and she gave verbal Consent to complete online application for Farxiga through [employer].Provider use is patient PCP.   Per [employer] , patient was approved with start of Enrollment: 10/06/2021 and End of Enrollment:05/28/2022.Patient request shipment be ship to either PCP office or her daughter home.Updated shipping address to PCP office as patient requested.  Inform Clinical pharmacist of approval and for him to send the prescription into [place] as I will Follow up with [employer] to confirm the prescription was submitted in one week.   Per Clinical Pharmacist, check if the patient picked up [place] and glucometer.Ask if the patient has started checking her blood sugars and Farxiga.  Patient states she has not pick up [place] or the glucometer.Patient is asking if she can wait for the shipment of Farxiga  instead of getting it filled now. Patient reports she will pick up the glucometer later today.Patient is aware [facility] [place]. Patient verbalized understanding, and agreed to check her blood sugar daily before breakfast as recommended by the clinical pharmacist.Patient is aware to keep a log as I will follow up with her in 2 weeks.Notified clinical pharmacist.  Per Clinical Pharmacist,I would prefer if we started now so we can see how she tolerates the medication and it can take a few weeks before she gets her first delivery. I will send in the Rx today to [employer].  [facility] states [facility], and they informed  her the cost of [place] for a 30 day supply is $250.00 which she can not afford.Patient is asking if the clinical pharmacist can send Lantus through vial instead of the Solostar pen as the Solostar pen cost $70.00 and the vial is $30.00.Notified Clinical pharmacist.  Per Clinical pharmacist,She has a voucher at clinic she can take to the pharmacy for a $0-DS.Can you get clarification from [facility] regarding the  insulin question? Her Lantus should be capped at $35 per month. I am unsure which insulin she was using in the past.There shouldn't be a difference in the price of the pen vs vial. It may be a different amount they are filling for her.   Per [facility], Solostar pen is $70.00 for 60 day supply, and would be the same for vial.For 30 day supply Solostar pen would be $35.00 and would be the same for vial. Notified Clinical pharmacist.   Anderson [place] Clinical Pharmacist Assistant [phone]

## 2021-10-06 NOTE — Telephone Encounter (Signed)
Pt is requesting vials see attached script ?

## 2021-10-08 ENCOUNTER — Other Ambulatory Visit: Payer: Self-pay | Admitting: Family Medicine

## 2021-10-08 DIAGNOSIS — Z794 Long term (current) use of insulin: Secondary | ICD-10-CM

## 2021-10-08 DIAGNOSIS — E1165 Type 2 diabetes mellitus with hyperglycemia: Secondary | ICD-10-CM

## 2021-10-10 NOTE — Telephone Encounter (Signed)
rx was changed to the pen instead of the vial on 10/06/21 ?Requested Prescriptions  ?Pending Prescriptions Disp Refills  ?? insulin glargine (LANTUS) 100 UNIT/ML injection [Pharmacy Med Name: LANTUS 100 UNIT/ML VIAL] 10 mL 1  ?  Sig: INJECT 80 UNITS INTO THE SKIN 2 TIMES DAILY. PLEASE SCHEDULE OFFICE VISIT BEFORE ANY FUTURE REFILL.  ?  ? Endocrinology:  Diabetes - Insulins Passed - 10/08/2021  1:45 AM  ?  ?  Passed - HBA1C is between 0 and 7.9 and within 180 days  ?  Hemoglobin A1C  ?Date Value Ref Range Status  ?09/02/2021 7.4 (A) 4.0 - 5.6 % Final  ? ?Hgb A1c MFr Bld  ?Date Value Ref Range Status  ?05/05/2019 8.7 (H) 4.8 - 5.6 % Final  ?  Comment:  ?           Prediabetes: 5.7 - 6.4 ?         Diabetes: >6.4 ?         Glycemic control for adults with diabetes: <7.0 ?  ?   ?  ?  Passed - Valid encounter within last 6 months  ?  Recent Outpatient Visits   ?      ? 1 month ago Diabetes mellitus without complication ([place])  ? [facility] [place], [place] Ruther[place], [name]  ? 5 months ago Type 2 diabetes mellitus with hyperglycemia, with long-term current use of insulin ([place])  ? [place], [name]  ? 2 years ago Type 2 diabetes mellitus with diabetic neuropathy, with long-term current use of insulin ([place])  ? [facility] [place], Kirstie Peri, MD  ? 4 years ago Type 2 diabetes mellitus with diabetic neuropathy, with long-term current use of insulin ([place])  ? [facility] Fisher, Kirstie Peri, MD  ? 4 years ago Type 2 diabetes mellitus with diabetic neuropathy, with long-term current use of insulin ([place])  ? [facility] Caryn Section, Kirstie Peri, MD  ?  ?  ? ?  ?  ?  ? ? ?

## 2021-10-12 ENCOUNTER — Other Ambulatory Visit: Payer: Self-pay | Admitting: Family Medicine

## 2021-10-12 MED ORDER — ACCU-CHEK AVIVA PLUS VI STRP: ORAL_STRIP | 4 refills | Status: DC

## 2021-10-12 MED ORDER — ACCU-CHEK AVIVA PLUS W/DEVICE KIT: PACK | 0 refills | Status: DC

## 2021-10-26 DIAGNOSIS — G894 Chronic pain syndrome: Secondary | ICD-10-CM | POA: Diagnosis not present

## 2021-10-26 DIAGNOSIS — R809 Proteinuria, unspecified: Secondary | ICD-10-CM

## 2021-10-26 DIAGNOSIS — M259 Joint disorder, unspecified: Secondary | ICD-10-CM | POA: Diagnosis not present

## 2021-10-26 DIAGNOSIS — Z79891 Long term (current) use of opiate analgesic: Secondary | ICD-10-CM | POA: Diagnosis not present

## 2021-10-26 DIAGNOSIS — E1129 Type 2 diabetes mellitus with other diabetic kidney complication: Secondary | ICD-10-CM

## 2021-10-26 DIAGNOSIS — M5136 Other intervertebral disc degeneration, lumbar region: Secondary | ICD-10-CM | POA: Diagnosis not present

## 2021-10-26 DIAGNOSIS — Z794 Long term (current) use of insulin: Secondary | ICD-10-CM

## 2021-11-02 ENCOUNTER — Telehealth: Payer: Self-pay

## 2021-11-02 NOTE — Progress Notes (Signed)
Chronic Care Management APPOINTMENT REMINDER   Called Amy Meyers, No answer, left message of appointment on 11/07/2021 at 1:00 pm via office visit with Junius Argyle , Pharm D. Notified to have all medications, supplements, blood pressure and/or blood sugar logs available during appointment and to return call if need to reschedule.   [place] Pharmacist Assistant [phone]

## 2021-11-04 ENCOUNTER — Other Ambulatory Visit: Payer: Self-pay

## 2021-11-04 DIAGNOSIS — Z20822 Contact with and (suspected) exposure to covid-19: Secondary | ICD-10-CM | POA: Insufficient documentation

## 2021-11-04 DIAGNOSIS — Z794 Long term (current) use of insulin: Secondary | ICD-10-CM | POA: Insufficient documentation

## 2021-11-04 DIAGNOSIS — E1165 Type 2 diabetes mellitus with hyperglycemia: Secondary | ICD-10-CM | POA: Diagnosis not present

## 2021-11-04 DIAGNOSIS — N390 Urinary tract infection, site not specified: Secondary | ICD-10-CM | POA: Diagnosis not present

## 2021-11-04 DIAGNOSIS — D72829 Elevated white blood cell count, unspecified: Secondary | ICD-10-CM | POA: Diagnosis not present

## 2021-11-04 DIAGNOSIS — Z7984 Long term (current) use of oral hypoglycemic drugs: Secondary | ICD-10-CM | POA: Insufficient documentation

## 2021-11-04 DIAGNOSIS — Z7982 Long term (current) use of aspirin: Secondary | ICD-10-CM | POA: Insufficient documentation

## 2021-11-04 DIAGNOSIS — F17200 Nicotine dependence, unspecified, uncomplicated: Secondary | ICD-10-CM | POA: Insufficient documentation

## 2021-11-04 DIAGNOSIS — H9202 Otalgia, left ear: Secondary | ICD-10-CM | POA: Insufficient documentation

## 2021-11-04 DIAGNOSIS — R509 Fever, unspecified: Secondary | ICD-10-CM | POA: Diagnosis not present

## 2021-11-04 DIAGNOSIS — R059 Cough, unspecified: Secondary | ICD-10-CM | POA: Insufficient documentation

## 2021-11-04 DIAGNOSIS — R000 Tachycardia, unspecified: Secondary | ICD-10-CM | POA: Diagnosis not present

## 2021-11-04 DIAGNOSIS — I10 Essential (primary) hypertension: Secondary | ICD-10-CM | POA: Diagnosis not present

## 2021-11-04 LAB — COMPREHENSIVE METABOLIC PANEL WITH GFR
ALT: 21 U/L (ref 0–44)
AST: 17 U/L (ref 15–41)
Albumin: 3.5 g/dL (ref 3.5–5.0)
Alkaline Phosphatase: 64 U/L (ref 38–126)
Anion gap: 5 (ref 5–15)
BUN: 20 mg/dL (ref 8–23)
CO2: 32 mmol/L (ref 22–32)
Calcium: 8.7 mg/dL — ABNORMAL LOW (ref 8.9–10.3)
Chloride: 99 mmol/L (ref 98–111)
Creatinine, Ser: 0.81 mg/dL (ref 0.44–1.00)
GFR, Estimated: >60 mL/min
Glucose, Bld: 232 mg/dL — ABNORMAL HIGH (ref 70–99)
Potassium: 4.5 mmol/L (ref 3.5–5.1)
Sodium: 136 mmol/L (ref 135–145)
Total Bilirubin: 0.5 mg/dL (ref 0.3–1.2)
Total Protein: 7.4 g/dL (ref 6.5–8.1)

## 2021-11-04 LAB — CBC WITH DIFFERENTIAL/PLATELET
Abs Immature Granulocytes: 0.05 K/uL (ref 0.00–0.07)
Basophils Absolute: 0 K/uL (ref 0.0–0.1)
Basophils Relative: 0 %
Eosinophils Absolute: 0.2 K/uL (ref 0.0–0.5)
Eosinophils Relative: 2 %
HCT: 42.3 % (ref 36.0–46.0)
Hemoglobin: 14.1 g/dL (ref 12.0–15.0)
Immature Granulocytes: 0 %
Lymphocytes Relative: 28 %
Lymphs Abs: 3.6 K/uL (ref 0.7–4.0)
MCH: 28.7 pg (ref 26.0–34.0)
MCHC: 33.3 g/dL (ref 30.0–36.0)
MCV: 86 fL (ref 80.0–100.0)
Monocytes Absolute: 0.8 K/uL (ref 0.1–1.0)
Monocytes Relative: 6 %
Neutro Abs: 8 K/uL — ABNORMAL HIGH (ref 1.7–7.7)
Neutrophils Relative %: 64 %
Platelets: 182 K/uL (ref 150–400)
RBC: 4.92 MIL/uL (ref 3.87–5.11)
RDW: 13.9 % (ref 11.5–15.5)
WBC: 12.6 K/uL — ABNORMAL HIGH (ref 4.0–10.5)
nRBC: 0 % (ref 0.0–0.2)

## 2021-11-04 LAB — CBG MONITORING, ED: Glucose-Capillary: 247 mg/dL — ABNORMAL HIGH (ref 70–99)

## 2021-11-04 NOTE — ED Triage Notes (Signed)
Daughter went to check on pt and thought pt appeared pal. Sugar 209 at 2020. Daughter gave insulin. Daughter gave her some food. Pt states she feels better now. Pt denies any chest pain or shortness of breathe. States she just has an earache

## 2021-11-04 NOTE — ED Notes (Signed)
Pt noted to be ambulating out of lobby doors with steady gait.

## 2021-11-05 ENCOUNTER — Emergency Department
Admission: EM | Admit: 2021-11-05 | Discharge: 2021-11-05 | Disposition: A | Discharge status: Home / Self Care | Payer: Medicare Other | Attending: Emergency Medicine | Admitting: Emergency Medicine

## 2021-11-05 ENCOUNTER — Emergency Department: Payer: Medicare Other

## 2021-11-05 DIAGNOSIS — R509 Fever, unspecified: Secondary | ICD-10-CM | POA: Diagnosis not present

## 2021-11-05 DIAGNOSIS — N390 Urinary tract infection, site not specified: Secondary | ICD-10-CM

## 2021-11-05 LAB — URINALYSIS, ROUTINE W REFLEX MICROSCOPIC
Bilirubin Urine: NEGATIVE
Glucose, UA: 50 mg/dL — AB
Hgb urine dipstick: NEGATIVE
Ketones, ur: NEGATIVE mg/dL
Nitrite: NEGATIVE
Protein, ur: 100 mg/dL — AB
Specific Gravity, Urine: 1.018 (ref 1.005–1.030)
pH: 5 (ref 5.0–8.0)

## 2021-11-05 LAB — SARS CORONAVIRUS 2 BY RT PCR: SARS Coronavirus 2 by RT PCR: NEGATIVE

## 2021-11-05 MED ORDER — CEPHALEXIN 500 MG PO CAPS: 500.0000 mg | ORAL_CAPSULE | Freq: Two times a day (BID) | ORAL | 0 refills | Status: DC

## 2021-11-05 MED ORDER — CEPHALEXIN 500 MG PO CAPS
500.0000 mg | ORAL_CAPSULE | Freq: Once | ORAL | Status: AC
  Administered 2021-11-05: 500 mg via ORAL
  Filled 2021-11-05: qty 1

## 2021-11-05 MED ORDER — ACETAMINOPHEN 500 MG PO TABS
1000.0000 mg | ORAL_TABLET | Freq: Once | ORAL | Status: AC
  Administered 2021-11-05: 1000 mg via ORAL
  Filled 2021-11-05: qty 2

## 2021-11-05 NOTE — ED Provider Notes (Signed)
[facility] Provider Note    Event Date/Time   First MD Initiated Contact with Patient 11/05/21 0112     (approximate)   History   No chief complaint on file.   HPI  Amy Meyers is a 61 y.o. female with history of hypertension, diabetes, tobacco use not on oxygen who presents to the emergency department with sleeping more than normal today.  Patient states she is not feeling well and family was concerned about her.  She denies any chest pain, shortness of breath.  Has had nonproductive cough.  No history of asthma or COPD.  Daughter was also concerned because her ambulatory sats were in the upper 32s.  She is also complained of left ear pain.  No sore throat.  She denies any abdominal pain, vomiting or diarrhea.  Has had some dysuria.  No known sick contacts.   History provided by patient and daughter.    Past Medical History:  Diagnosis Date   Anxiety    Arthritis    Diabetes mellitus without complication ([place])    Diabetic foot ulcer ([place]) 04/06/2015   Hypertension    Kidney infection    Open wound of right foot    Staph infection    right foot    Past Surgical History:  Procedure Laterality Date   ABDOMINAL ADHESION SURGERY     ABDOMINAL HYSTERECTOMY  1998   with oophorectomy due to adhesions   CARPAL TUNNEL RELEASE Bilateral    [place]   x 2    CHOLECYSTECTOMY  1981   FOOT SURGERY Left    JOINT REPLACEMENT     right hip replacement   TONSILLECTOMY  1980   TONSILLECTOMY     upper GI X ray series  06/04/2001   Hiatal Hernia    MEDICATIONS:  Prior to Admission medications   Medication Sig Start Date End Date Taking? Authorizing Provider  amphetamine-dextroamphetamine (ADDERALL) 20 MG tablet Take 20 mg by mouth daily. 04/03/19   [provider]  Aspirin 81 MG CAPS Take 81 mg by mouth daily. Reported on 07/30/2015    [provider]  blood glucose meter kit and supplies KIT Dispense based on patient  and insurance preference. Use to monitor blood sugars once daily as directed. 10/04/21   Birdie Sons, MD  Blood Glucose Monitoring Suppl (ACCU-CHEK AVIVA PLUS) w/Device KIT Use to check blood sugar daily for type 2 diabetes. E11.9 10/12/21   Birdie Sons, MD  dapagliflozin propanediol (FARXIGA) 10 MG TABS tablet Take 1 tablet (10 mg total) by mouth daily before breakfast. Patient receives via [employer] Patient Assistance 10/06/21   Birdie Sons, MD  gabapentin (NEURONTIN) 400 MG capsule Take 400 mg by mouth at bedtime as needed. 04/03/19   [provider]  glucose blood (ACCU-CHEK AVIVA PLUS) test strip Use to check blood sugar once a day 10/12/21   Birdie Sons, MD  insulin glargine (LANTUS SOLOSTAR) 100 UNIT/ML Solostar Pen Inject 80 Units into the skin 2 (two) times daily. 10/06/21   Birdie Sons, MD  Insulin Syringe 27G X 1/2" 1 ML MISC 1 Units by Does not apply route daily. For insulin dependent type 2 diabetes 05/06/19   Birdie Sons, MD  methadone (DOLOPHINE) 10 MG tablet Limit 5-7 tabs by mouth per day if tolerated Patient taking differently: Take 10 mg by mouth. Limit 5-7 tabs by mouth per day if tolerated 01/13/16   Mohammed Kindle,  MD  NARCAN 4 MG/0.1ML LIQD nasal spray kit  02/18/19   [provider]  oxyCODONE (OXY IR/ROXICODONE) 5 MG immediate release tablet Limit 4 - 6 tabs po / day for breakthrough pain while taking methadone if tolerated 01/13/16   Mohammed Kindle, MD    Physical Exam   Triage Vital Signs: ED Triage Vitals  Enc Vitals Group     BP 11/04/21 2218 (!) 156/77     Pulse Rate 11/04/21 2218 94     Resp 11/04/21 2218 20     Temp 11/04/21 2218 (!) 100.5 F (38.1 C)     Temp Source 11/04/21 2218 Oral     SpO2 11/04/21 2218 94 %     Weight 11/04/21 2220 195 lb (88.5 kg)     Height 11/04/21 2220 '5\' 6"'  (1.676 m)     Head Circumference --      Peak Flow --      Pain Score --      Pain Loc --      Pain Edu? --      Excl. in [place]? --      Most recent vital signs: Vitals:   11/05/21 0125 11/05/21 0131  BP: (!) 169/73 129/73  Pulse: 86 (!) 104  Resp: 18 17  Temp:    SpO2: 98% 95%    CONSTITUTIONAL: Alert and oriented and responds appropriately to questions. Well-appearing; well-nourished HEAD: Normocephalic, atraumatic EYES: Conjunctivae clear, pupils appear equal, sclera nonicteric ENT: normal nose; moist mucous membranes; No pharyngeal erythema or petechiae, no tonsillar hypertrophy or exudate, no uvular deviation, no unilateral swelling in posterior oropharynx, no trismus or drooling, no muffled voice, normal phonation, no stridor, airway patent.  TMs are clear bilaterally without erythema, purulence, bulging, perforation, effusion.  No cerumen impaction or sign of foreign body in the external auditory canal. No inflammation, erythema or drainage from the external auditory canal. No signs of mastoiditis. No pain with manipulation of the pinna bilaterally. NECK: Supple, normal ROM, no meningismus CARD: RRR; S1 and S2 appreciated; no murmurs, no clicks, no rubs, no gallops RESP: Normal chest excursion without splinting or tachypnea; breath sounds clear and equal bilaterally; no wheezes, no rhonchi, no rales, no hypoxia or respiratory distress, speaking full sentences ABD/GI: Normal bowel sounds; non-distended; soft, non-tender, no rebound, no guarding, no peritoneal signs BACK: The back appears normal EXT: Normal ROM in all joints; no deformity noted, no edema; no cyanosis SKIN: Normal color for age and race; warm; no rash on exposed skin NEURO: Moves all extremities equally, normal speech PSYCH: The patient's mood and manner are appropriate.   ED Results / Procedures / Treatments   LABS: (all labs ordered are listed, but only abnormal results are displayed) Labs Reviewed  CBC WITH DIFFERENTIAL/PLATELET - Abnormal; Notable for the following components:      Result Value   WBC 12.6 (*)    Neutro Abs 8.0 (*)     All other components within normal limits  COMPREHENSIVE METABOLIC PANEL - Abnormal; Notable for the following components:   Glucose, Bld 232 (*)    Calcium 8.7 (*)    All other components within normal limits  URINALYSIS, ROUTINE W REFLEX MICROSCOPIC - Abnormal; Notable for the following components:   Color, Urine YELLOW (*)    APPearance HAZY (*)    Glucose, UA 50 (*)    Protein, ur 100 (*)    Leukocytes,Ua TRACE (*)    Bacteria, UA RARE (*)    All other components  within normal limits  CBG MONITORING, ED - Abnormal; Notable for the following components:   Glucose-Capillary 247 (*)    All other components within normal limits  SARS CORONAVIRUS 2 BY RT PCR  URINE CULTURE     EKG:  EKG Interpretation  Date/Time:  Friday November 04 2021 22:38:16 EDT Ventricular Rate:  96 PR Interval:  144 QRS Duration: 142 QT Interval:  392 QTC Calculation: 495 R Axis:   4 Text Interpretation: Normal sinus rhythm Right bundle branch block Possible Inferior infarct (cited on or before 25-Apr-2014) Abnormal ECG When compared with ECG of 13-Nov-2015 15:24, QRS axis Shifted right No significant change since last tracing Confirmed by Pryor Curia [phone]) on 11/05/2021 1:29:06 AM         RADIOLOGY: My personal review and interpretation of imaging: Chest x-ray clear.  I have personally reviewed all radiology reports.   DG Chest 2 View  Result Date: 11/05/2021 CLINICAL DATA:  Fever EXAM: CHEST - 2 VIEW COMPARISON:  02/26/2021 FINDINGS: The heart size and mediastinal contours are within normal limits. Both lungs are clear. The visualized skeletal structures are unremarkable. IMPRESSION: No active cardiopulmonary disease. Electronically Signed   By: Ulyses Jarred M.D.   On: 11/05/2021 01:28     PROCEDURES:  Critical Care performed: No      .1-3 Lead EKG Interpretation  Performed by: Evalise Abruzzese, Delice Bison, DO Authorized by: Jenifer Struve, Delice Bison, DO     Interpretation: normal     ECG rate:  86   ECG  rate assessment: normal     Rhythm: sinus rhythm     Ectopy: none     Conduction: normal       IMPRESSION / MDM / ASSESSMENT AND PLAN / ED COURSE  I reviewed the triage vital signs and the nursing notes.    Patient here with not feeling well over the past day.  Low-grade fever here of 100.5.  Having complaints of nonproductive cough, left ear pain, dysuria.  The patient is on the cardiac monitor to evaluate for evidence of arrhythmia and/or significant heart rate changes.   DIFFERENTIAL DIAGNOSIS (includes but not limited to):   Viral URI, pneumonia, UTI, bacteremia, sepsis   Patient's presentation is most consistent with acute presentation with potential threat to life or bodily function.   PLAN: We will obtain CBC, BMP, urinalysis, urine culture, chest x-ray, COVID swab.  Will give Tylenol.  Will obtain ambulatory sat.   MEDICATIONS GIVEN IN ED: Medications  cephALEXin (KEFLEX) capsule 500 mg (500 mg Oral Given 11/05/21 0127)  acetaminophen (TYLENOL) tablet 1,000 mg (1,000 mg Oral Given 11/05/21 0141)     ED COURSE: Patient's labs show mild leukocytosis of 12,000.  Normal electrolytes, LFTs.  Chest x-ray reviewed/interpreted by myself and radiology and shows no infiltrate, edema or pneumothorax.  COVID-negative.  Urine dysuria mildly infected with trace leukocyte esterase, 11-20 white blood cells and rare bacteria.  We will add on a urine culture.  Given she is having some symptoms, will treat her UTI with Keflex.  Her ambulatory sat was in the mid to upper 90s here.  She is not having any shortness of breath, chest pain.  EKG shows no new ischemic change.  Given she does have a low-grade fever, leukocytosis and mild tachycardia, we discussed the beginnings of possible early sepsis, SIRS but patient is otherwise well-appearing, nontoxic and would prefer discharge home.  I feel this is reasonable given she is well-appearing here, tolerating p.o.  Will discharge with Keflex.  She has  a primary care doctor for close follow-up   At this time, I do not feel there is any life-threatening condition present. I reviewed all nursing notes, vitals, pertinent previous records.  All lab and urine results, EKGs, imaging ordered have been independently reviewed and interpreted by myself.  I reviewed all available radiology reports from any imaging ordered this visit.  Based on my assessment, I feel the patient is safe to be discharged home without further emergent workup and can continue workup as an outpatient as needed. Discussed all findings, treatment plan as well as usual and customary return precautions with patient and daughter.  They verbalize understanding and are comfortable with this plan.  Outpatient follow-up has been provided as needed.  All questions have been answered.    CONSULTS: Admission considered but patient declined stating she would like to go home for treatment.   OUTSIDE RECORDS REVIEWED: Reviewed patient's last office visit with podiatry in January 2019.       FINAL CLINICAL IMPRESSION(S) / ED DIAGNOSES   Final diagnoses:  Fever, unspecified fever cause  Acute UTI     Rx / DC Orders   ED Discharge Orders          Ordered    cephALEXin (KEFLEX) 500 MG capsule  2 times daily        11/05/21 0145             Note:  This document was prepared using Dragon voice recognition software and may include unintentional dictation errors.   Xuan Mateus, Delice Bison, DO 11/05/21 [phone]

## 2021-11-05 NOTE — Discharge Instructions (Signed)
You may alternate Tylenol 1000 mg every 6 hours as needed for pain, fever and Ibuprofen 800 mg every 6-8 hours as needed for pain, fever.  Please take Ibuprofen with food.  Do not take more than 4000 mg of Tylenol (acetaminophen) in a 24 hour period. ° °

## 2021-11-05 NOTE — ED Notes (Signed)
Pt ambulated in room, SPO2 at 96-99% room air

## 2021-11-06 LAB — URINE CULTURE

## 2021-11-07 ENCOUNTER — Ambulatory Visit: Payer: Medicare Other

## 2021-11-07 NOTE — Progress Notes (Deleted)
Chronic Care Management Pharmacy Note  11/07/2021 Name:  Amy Meyers MRN:  [id number] DOB:  01-Jun-1960  Summary: Patient presents for CCM follow-up.  -Discussed benefits of SGLT-2 inhibitor in preventing CKD, patient was amenable.   Recommendations/Changes made from today's visit: -START Farxiga 10 mg daily, 30-day voucher left at front desk for patient to take to pharmacy.   Plan: -HC assessment in one week to ensure voucher picked up and medication started.  CPP follow-up 1 month   Subjective: Amy Meyers is an 61 y.o. year old female who is a primary patient of Fisher, Kirstie Peri, MD.  The CCM team was consulted for assistance with disease management and care coordination needs.    Engaged with patient by telephone for follow up visit in response to provider referral for pharmacy case management and/or care coordination services.   Consent to Services:  The patient was given information about Chronic Care Management services, agreed to services, and gave verbal consent prior to initiation of services.  Please see initial visit note for detailed documentation.   Patient Care Team: Birdie Sons, MD as PCP - General (Family Medicine) Mohammed Kindle, MD as Attending Physician (Pain Medicine) Samara Deist, DPM as Referring Physician (Podiatry) Germaine Pomfret, [facility] (Pharmacist)  Recent office visits: 09/02/21: Patient presented to [name], [name] for follow-up.   05/06/2021 Erin Mecum [name] (PCP Office) Change Lantus to 0.8 mLs (80 units total) into the skin 2 times daily, AMB Referral to [facility] Coordination,follow up in 3 months   Recent consult visits: No recent consult visit  Hospital visits: None noted in past 6 months.    Objective:  Lab Results  Component Value Date   CREATININE 0.81 11/04/2021   BUN 20 11/04/2021   GFRNONAA >60 11/04/2021   GFRAA 100 05/05/2019   NA 136 11/04/2021   K 4.5 11/04/2021   CALCIUM 8.7 (L) 11/04/2021   CO2 32  11/04/2021   GLUCOSE 232 (H) 11/04/2021    Lab Results  Component Value Date/Time   HGBA1C 7.4 (A) 09/02/2021 04:21 PM   HGBA1C 11.8 (A) 05/06/2021 11:49 AM   HGBA1C 8.7 (H) 05/05/2019 03:48 PM   HGBA1C 6.7 (A) 01/28/2014 12:00 AM   MICROALBUR 100 09/28/2017 12:07 PM    Last diabetic Eye exam: No results found for: "HMDIABEYEEXA"  Last diabetic Foot exam: No results found for: "HMDIABFOOTEX"   Lab Results  Component Value Date   CHOL 176 05/27/2021   HDL 30 (L) 05/27/2021   LDLCALC 118 (H) 05/27/2021   TRIG 157 (H) 05/27/2021   CHOLHDL 5.9 (H) 05/27/2021       Latest Ref Rng & Units 11/04/2021   10:32 PM 05/27/2021    2:07 PM 05/05/2019    3:48 PM  Hepatic Function  Total Protein 6.5 - 8.1 g/dL 7.4  7.1  6.9   Albumin 3.5 - 5.0 g/dL 3.5  4.0  3.7   AST 15 - 41 U/L '17  11  11   ' ALT 0 - 44 U/L '21  12  8   ' Alk Phosphatase 38 - 126 U/L 64  84  73   Total Bilirubin 0.3 - 1.2 mg/dL 0.5  0.5  0.3     Lab Results  Component Value Date/Time   TSH 1.770 05/05/2019 03:48 PM       Latest Ref Rng & Units 11/04/2021   10:32 PM 05/27/2021    2:07 PM 06/23/2020    2:03 PM  CBC  WBC 4.0 - 10.5 K/uL 12.6  11.4  9.9   Hemoglobin 12.0 - 15.0 g/dL 14.1  15.1  15.5   Hematocrit 36.0 - 46.0 % 42.3  44.7  47.2   Platelets 150 - 400 K/uL 182  192  164     No results found for: "VD25OH"  Clinical ASCVD: No  The 10-year ASCVD risk score (Arnett DK, et al., 2019) is: 26.4%   Values used to calculate the score:     Age: 79 years     Sex: Female     Is Non-Hispanic African American: No     Diabetic: Yes     Tobacco smoker: Yes     Systolic Blood Pressure: 597 mmHg     Is BP treated: Yes     HDL Cholesterol: 30 mg/dL     Total Cholesterol: 176 mg/dL       09/02/2021    3:57 PM 05/05/2019    3:40 PM 06/23/2016   11:16 AM  Depression screen PHQ 2/9  Decreased Interest 1 3 0  Down, Depressed, Hopeless '2 3 3  ' PHQ - 2 Score '3 6 3  ' Altered sleeping '1 1 1  ' Tired, decreased energy '1 3  3  ' Change in appetite '2 3 1  ' Feeling bad or failure about yourself  '2 3 3  ' Trouble concentrating 2 3 0  Moving slowly or fidgety/restless 0 1 0  Suicidal thoughts '2 1 1  ' PHQ-9 Score '13 21 12  ' Difficult doing work/chores Very difficult Very difficult Somewhat difficult    Social History   Tobacco Use  Smoking Status Every Day   Packs/day: 1.00   Years: 44.00   Total pack years: 44.00   Types: Cigarettes  Smokeless Tobacco Former   Types: Chew   BP Readings from Last 3 Encounters:  11/05/21 129/73  09/02/21 (!) 136/57  05/06/21 140/68   Pulse Readings from Last 3 Encounters:  11/05/21 (!) 104  09/02/21 74  05/06/21 88   Wt Readings from Last 3 Encounters:  11/04/21 195 lb (88.5 kg)  09/02/21 203 lb (92.1 kg)  05/06/21 191 lb (86.6 kg)   BMI Readings from Last 3 Encounters:  11/04/21 31.47 kg/m  09/02/21 33.78 kg/m  05/06/21 31.78 kg/m    Assessment/Interventions: Review of patient past medical history, allergies, medications, health status, including review of consultants reports, laboratory and other test data, was performed as part of comprehensive evaluation and provision of chronic care management services.   SDOH:  (Social Determinants of Health) assessments and interventions performed: Yes   SDOH Screenings   Alcohol Screen: Low Risk  (09/02/2021)   Alcohol Screen    Last Alcohol Screening Score (AUDIT): 0  Depression (PHQ2-9): Medium Risk (09/02/2021)   Depression (PHQ2-9)    PHQ-2 Score: 13  Financial Resource Strain: High Risk (06/13/2021)   Overall Financial Resource Strain (CARDIA)    Difficulty of Paying Living Expenses: Very hard  Food Insecurity: Not on file  Housing: Not on file  Physical Activity: Not on file  Social Connections: Not on file  Stress: Not on file  Tobacco Use: High Risk (09/04/2021)   Patient History    Smoking Tobacco Use: Every Day    Smokeless Tobacco Use: Former    Passive Exposure: Not on file  Transportation Needs: Not  on file    [place]  Allergies  Allergen Reactions   Meloxicam Itching, Swelling and Nausea And Vomiting   Sulfa Antibiotics Other (See Comments)  Medications Reviewed Today     Reviewed by Germaine Pomfret, [facility] (Pharmacist) on 10/04/21 at 1614  Med List Status: <None>   Medication Order Taking? Sig Documenting Provider Last Dose Status Informant  amphetamine-dextroamphetamine (ADDERALL) 20 MG tablet [id number] Yes Take 20 mg by mouth daily. [provider] Taking Active   Aspirin 81 MG CAPS [id number] Yes Take 81 mg by mouth daily. Reported on 07/30/2015 [provider] Taking Active Self  gabapentin (NEURONTIN) 400 MG capsule [id number] Yes Take 400 mg by mouth at bedtime as needed. [provider] Taking Active   insulin glargine (LANTUS) 100 UNIT/ML injection [id number] Yes INJECT 80 UNITS INTO THE SKIN 2 TIMES DAILY. PLEASE SCHEDULE OFFICE VISIT BEFORE ANY FUTURE REFILL. Birdie Sons, MD Taking Active   Insulin Syringe 27G X 1/2" 1 ML MISC [id number]  1 Units by Does not apply route daily. For insulin dependent type 2 diabetes Birdie Sons, MD  Active   methadone (DOLOPHINE) 10 MG tablet [id number] Yes Limit 5-7 tabs by mouth per day if tolerated  Patient taking differently: Take 10 mg by mouth. Limit 5-7 tabs by mouth per day if tolerated   Mohammed Kindle, MD Taking Active   [facility] 4 MG/0.1ML LIQD nasal spray kit [id number]   [provider]  Active   oxyCODONE (OXY IR/ROXICODONE) 5 MG immediate release tablet [id number] Yes Limit 4 - 6 tabs po / day for breakthrough pain while taking methadone if tolerated Mohammed Kindle, MD Taking Active   Med List Note Hart Rochester, RN 01/13/16 0820): UDS  09/16/15 MR 02/23/16 CS 01/12/2015              Patient Active Problem List   Diagnosis Date Noted   Hyperlipidemia associated with type 2 diabetes mellitus ([place]) 06/13/2021   Type 2 diabetes mellitus with microalbuminuria, with  long-term current use of insulin ([place]) 05/06/2021   Proteinuria 12/27/2016   Callous ulcer ([place]) 05/11/2015   Anxiety 04/13/2015   Arthritis, degenerative 04/06/2015   Neuropathy 04/06/2015   Back pain, chronic 04/06/2015   Abnormal ECG 03/17/2015   Hypertension 03/17/2015   NAFLD (nonalcoholic fatty liver disease) 03/17/2015   Morbid obesity ([place]) 03/17/2015   Palpitations 03/17/2015   Pulmonary edema 03/17/2015   PVC's (premature ventricular contractions) 03/17/2015   Breath shortness 03/17/2015   Headache, tension-type 03/17/2015   Type 2 diabetes mellitus with right diabetic foot ulcer ([place]) 01/06/2015   Facet syndrome, lumbar 11/26/2014   Sacroiliac joint dysfunction 11/26/2014   DDD (degenerative disc disease), thoracolumbar 09/29/2014   Diabetes mellitus due to underlying condition with diabetic neuropathy ([place]) 09/29/2014   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     Medication Assistance: Application for [place]  medication assistance program. in process.  Anticipated assistance start date TBD.  See plan of care for additional detail.  Compliance/Adherence/Medication fill history: Care Gaps: COVID 19 Vaccine Pneumococcal Vaccine Ophthalmology Exam  PAP smear- Modifier Colonoscopy Mammogram Shingrix Vaccine  Star-Rating Drugs: NA  Patient's preferred pharmacy is:  [facility] -  [place], [place], [place] [address] Phone: [phone] Fax: [phone]  [place], [place] [address] [facility] [place] [place] [address] Phone: [phone] Fax: [phone]  [id number] [place], [address]: [phone]: [phone] [id number] [place]: [phone]: [address]: [phone]: [phone] pill box? No Pt endorses 25% compliance  We discussed: Current pharmacy is preferred with insurance plan and patient is satisfied with pharmacy services Patient decided to: Continue current medication management strategy  Care Plan and Follow Up Patient Decision:  Patient agrees to Care Plan and Follow-up.  Plan: Face to Face appointment with care management team member scheduled for: 11/07/2021 at 1:00 PM  AlJunius ArgylePharmD, BCHokendauquaCPP  Clinical Pharmacist Practitioner  [phone] Barriers:  Unable to independently afford treatment regimen Unable to achieve control of diabetes  Suboptimal therapeutic regimen for HTN, HLD  Pharmacist Clinical Goal(s):  Patient will verbalize ability to afford treatment regimen achieve control of Diabetes as evidenced by A1c less than 7% adhere to plan to optimize therapeutic regimen for HTN, HLD as evidenced by report of adherence to recommended medication management changes through collaboration with PharmD and provider.   Interventions: 1:1 collaboration with FiBirdie SonsMD regarding development and update of comprehensive plan of care as evidenced by provider attestation and co-signature Inter-disciplinary care team collaboration (see longitudinal plan of care) Comprehensive medication review performed;  medication list updated in electronic medical record  Hypertension (BP goal <130/80) -Uncontrolled -Current treatment: None -Medications previously tried: Lisinopril, Carvedilol   -Current home readings: Does not monitor at home.  -Would recommend ACEi or ARB given history of microalbuminuria -Continue current medications for now  Hyperlipidemia: (LDL goal < 70) -Uncontrolled -Current treatment: Aspirin 81 mg daily  -Medications previously tried: NA  -inconsistently taking  -Patient is a candidate for high intensity statin given T2DM and ASCVD risk > 20%. unable to discuss with patient prior to phone disconnecting. Will discuss at next follow-up.  -Recommend rosuvastatin 20 mg daily at next follow-up.   Diabetes (A1c goal <7%) -Uncontrolled, but significantly improved.  -Diagnosed 1088+ears,  -Complicated by: Neuropathy, Microalbuminuria, -Current medications: Farxiga 10 mg daily  Lantus 80 units twice daily (1.84 u/kg): Appropriate, Effective, Query safe -Medications previously tried: Metformin -Current home glucose readings fasting glucose: does not have a glucometer. Replacement sent in today.  -Denies hypoglycemic symptoms -Current meal patterns:  breakfast: Typically skips  lunch: Pimento Cheese Sandwich   dinner: Microwavable meals drinks: Coffee, water, diet soda.  -Current exercise: Minimal   Anxiety/ ADHD (Goal: Minimize symptoms) -Controlled -Managed by SuMyer HaffMD -Current treatment: Adderrall 20 mg: Appropriate, Effective, Query Safe -Medications previously tried/failed: NA -Discussed impact of stimulants on blood pressure. -Recommended to continue current medication  Chronic Pain (Goal: Minimize pain symptoms) -Controlled -Managed by CrMohammed KindleMD -Current treatment  Gabapentin 400 mg nightly as needed: Appropriate, Query effective Methadone 10 mg: Appropriate, Query effective Oxycodone 5 mg: Appropriate, Query effective  -Medications  previously tried: NA -Unable to discuss pain management prior to phone  call disconnecting.   -Recommended to continue current medication  Patient Goals/Self-Care Activities Patient will:  - check glucose daily before breakfast, document, and provide at future appointments check blood pressure weekly, document, and provide at future appointments  Follow Up Plan: Face to Face appointment with care management team member scheduled for: 11/07/2021 at 1:00 PM

## 2021-11-23 DIAGNOSIS — E6609 Other obesity due to excess calories: Secondary | ICD-10-CM | POA: Diagnosis not present

## 2021-11-23 DIAGNOSIS — M259 Joint disorder, unspecified: Secondary | ICD-10-CM | POA: Diagnosis not present

## 2021-11-23 DIAGNOSIS — M79669 Pain in unspecified lower leg: Secondary | ICD-10-CM | POA: Diagnosis not present

## 2021-11-23 DIAGNOSIS — G894 Chronic pain syndrome: Secondary | ICD-10-CM | POA: Diagnosis not present

## 2021-11-23 DIAGNOSIS — E0840 Diabetes mellitus due to underlying condition with diabetic neuropathy, unspecified: Secondary | ICD-10-CM | POA: Diagnosis not present

## 2021-11-23 DIAGNOSIS — M25559 Pain in unspecified hip: Secondary | ICD-10-CM | POA: Diagnosis not present

## 2021-11-23 DIAGNOSIS — Z79891 Long term (current) use of opiate analgesic: Secondary | ICD-10-CM | POA: Diagnosis not present

## 2021-11-23 DIAGNOSIS — M792 Neuralgia and neuritis, unspecified: Secondary | ICD-10-CM | POA: Diagnosis not present

## 2021-11-23 DIAGNOSIS — M48062 Spinal stenosis, lumbar region with neurogenic claudication: Secondary | ICD-10-CM | POA: Diagnosis not present

## 2021-11-23 DIAGNOSIS — M47897 Other spondylosis, lumbosacral region: Secondary | ICD-10-CM | POA: Diagnosis not present

## 2021-11-23 DIAGNOSIS — M5136 Other intervertebral disc degeneration, lumbar region: Secondary | ICD-10-CM | POA: Diagnosis not present

## 2021-11-23 DIAGNOSIS — G8929 Other chronic pain: Secondary | ICD-10-CM | POA: Diagnosis not present

## 2021-11-23 DIAGNOSIS — M4807 Spinal stenosis, lumbosacral region: Secondary | ICD-10-CM | POA: Diagnosis not present

## 2021-11-28 ENCOUNTER — Telehealth: Payer: Self-pay

## 2021-11-28 NOTE — Progress Notes (Signed)
Chronic Care Management Pharmacy Assistant   Name: CHIDINMA CLITES  MRN: [id number] DOB: Jun 02, 1960  Reason for Encounter: Diabetes Disease State Call.   Recent office visits:  No recent office visit  Recent consult visits:  No recent consult visit  Hospital visits:  Medication Reconciliation was completed by comparing discharge summary, patient's EMR and Pharmacy list, and upon discussion with patient.  Admitted to the hospital on 11/05/2021 due to Fever. Discharge date was 11/05/2021. Discharged from [facility]?Medications Started at [facility] Discharge:?? -started Cephalexin 500 mg 2 times daily  Medication Changes at Hospital Discharge: -Changed None ID  Medications Discontinued at Hospital Discharge: -Stopped None ID  Medications that remain the same after Hospital Discharge:??  -All other medications will remain the same.    Medications: Outpatient Encounter Medications as of 11/28/2021  Medication Sig   amphetamine-dextroamphetamine (ADDERALL) 20 MG tablet Take 20 mg by mouth daily.   Aspirin 81 MG CAPS Take 81 mg by mouth daily. Reported on 07/30/2015   blood glucose meter kit and supplies KIT Dispense based on patient and insurance preference. Use to monitor blood sugars once daily as directed.   Blood Glucose Monitoring Suppl (ACCU-CHEK AVIVA PLUS) w/Device KIT Use to check blood sugar daily for type 2 diabetes. E11.9   cephALEXin (KEFLEX) 500 MG capsule Take 1 capsule (500 mg total) by mouth 2 (two) times daily.   dapagliflozin propanediol (FARXIGA) 10 MG TABS tablet Take 1 tablet (10 mg total) by mouth daily before breakfast. Patient receives via [employer] Patient Assistance   gabapentin (NEURONTIN) 400 MG capsule Take 400 mg by mouth at bedtime as needed.   glucose blood (ACCU-CHEK AVIVA PLUS) test strip Use to check blood sugar once a day   insulin glargine (LANTUS SOLOSTAR) 100 UNIT/ML Solostar Pen Inject 80 Units into the skin 2 (two) times  daily.   Insulin Syringe 27G X 1/2" 1 ML MISC 1 Units by Does not apply route daily. For insulin dependent type 2 diabetes   methadone (DOLOPHINE) 10 MG tablet Limit 5-7 tabs by mouth per day if tolerated (Patient taking differently: Take 10 mg by mouth. Limit 5-7 tabs by mouth per day if tolerated)   NARCAN 4 MG/0.1ML LIQD nasal spray kit    oxyCODONE (OXY IR/ROXICODONE) 5 MG immediate release tablet Limit 4 - 6 tabs po / day for breakthrough pain while taking methadone if tolerated   No facility-administered encounter medications on file as of 11/28/2021.    Care Gaps: COVID 19 Vaccine Ophthalmology Exam  Colonoscopy Mammogram Shingrix Vaccine     Star Rating Drugs: Farxiga 10 mg last [facility] Pioglitazone 15 mg not on fill report   Medication Fill Gaps: None ID  Recent Relevant Labs: Lab Results  Component Value Date/Time   HGBA1C 7.4 (A) 09/02/2021 04:21 PM   HGBA1C 11.8 (A) 05/06/2021 11:49 AM   HGBA1C 8.7 (H) 05/05/2019 03:48 PM   HGBA1C 6.7 (A) 01/28/2014 12:00 AM   MICROALBUR 100 09/28/2017 12:07 PM    Kidney Function Lab Results  Component Value Date/Time   CREATININE 0.81 11/04/2021 10:32 PM   CREATININE 0.80 05/27/2021 02:07 PM   CREATININE 0.79 04/25/2014 01:15 PM   CREATININE 0.75 11/17/2013 11:55 AM   GFRNONAA >60 11/04/2021 10:32 PM   GFRNONAA >60 04/25/2014 01:15 PM   GFRNONAA >60 11/17/2013 11:55 AM   GFRAA 100 05/05/2019 03:48 PM   GFRAA >60 04/25/2014 01:15 PM  GFRAA >60 11/17/2013 11:55 AM    Current antihyperglycemic regimen:  Lantus 80 units twice daily   What recent interventions/DTPs have been made to improve glycemic control:  Patient reports she stop taking her Wilder Glade because it was causing her to feel bad. Patient states she develop a UTI from taking it. Notified Clinical Pharmacist, and schedule a office appointment on 11/19/2021 with the clinical pharmacist at 1:00 pm.  Have there been any  recent hospitalizations or ED visits since last visit with CPP? Yes  Patient denies hypoglycemic symptoms, including Pale, Sweaty, Shaky, Hungry, Nervous/irritable, and Vision changes  Patient reports hyperglycemic symptoms, including weakness  How often are you checking your blood sugar? once daily  What are your blood sugars ranging?  139,150, and 240 (after meals)  During the week, how often does your blood glucose drop below 70? Never  Are you checking your feet daily/regularly?   Patient reports pain in both feet. Patient states she has always have had pain in both feet.  Adherence Review: Is the patient currently on a STATIN medication? No Is the patient currently on ACE/ARB medication? No Does the patient have >5 day gap between last estimated fill dates? No   [place] Clinical Production designer, theatre/television/film [phone]

## 2021-12-12 ENCOUNTER — Telehealth: Payer: Self-pay | Admitting: Family Medicine

## 2021-12-12 NOTE — Telephone Encounter (Signed)
Copied from [place] [id number]. Topic: Medicare AWV >> Dec 12, 2021 10:14 AM Jae Dire wrote: Reason for CRM:  Left message for patient to call back and schedule Medicare Annual Wellness Visit (AWV) in office.   If unable to come into the office for AWV,  please offer to do virtually or by telephone.  Last AWV: 06/23/2016  Please schedule at anytime with [facility] Health Advisor.  30 minute appointment for Virtual or phone 45 minute appointment for in office or Initial virtual/phone  Any questions, please contact me at [phone]

## 2021-12-16 ENCOUNTER — Telehealth: Payer: Self-pay

## 2021-12-16 ENCOUNTER — Ambulatory Visit: Payer: Self-pay

## 2021-12-16 ENCOUNTER — Other Ambulatory Visit: Payer: Self-pay | Admitting: Family Medicine

## 2021-12-16 DIAGNOSIS — E1165 Type 2 diabetes mellitus with hyperglycemia: Secondary | ICD-10-CM

## 2021-12-16 MED ORDER — LANTUS SOLOSTAR 100 UNIT/ML ~~LOC~~ SOPN: 80.0000 [IU] | PEN_INJECTOR | Freq: Two times a day (BID) | SUBCUTANEOUS | 0 refills | Status: DC

## 2021-12-16 NOTE — Telephone Encounter (Signed)
"  Call cannot be completed as dialed"

## 2021-12-16 NOTE — Telephone Encounter (Signed)
  Chief Complaint: missing medication - pt sleepy Symptoms: sleepy Frequency: 1 days Pertinent Negatives: Patient denies  Disposition: '[]'$ ED /'[]'$ Urgent Care (no appt availability in office) / '[]'$ Appointment(In office/virtual)/ '[]'$  [facility]/ '[]'$ Home Care/ '[]'$ Refused Recommended Disposition /'[]'$ [place] Mobile [facility]/ '[x]'$  Follow-up with PCP Additional Notes: Call from pt's daughter regarding missing medication. PT is out of Lantus, today is the 2nd day.   Pt is acting lethargic, daughter is not with pt. Daughter will go back to mom's house and take a BG measurement and call back.    Reason for Disposition  [1] Caller requesting a prescription renewal (no refills left), no triage required, AND [2] triager able to renew prescription per department policy  Answer Assessment - Initial Assessment Questions 1. DRUG NAME: "What medicine do you need to have refilled?" Lantus      2. REFILLS REMAINING: "How many refills are remaining?" (Note: The label on the medicine or pill bottle will show how many refills are remaining. If there are no refills remaining, then a renewal may be needed.)      3. EXPIRATION DATE: "What is the expiration date?" (Note: The label states when the prescription will expire, and thus can no longer be refilled.)      4. PRESCRIBING HCP: "Who prescribed it?" Reason: If prescribed by specialist, call should be referred to that group.      5. SYMPTOMS: "Do you have any symptoms?"     Sleepy 6. PREGNANCY: "Is there any chance that you are pregnant?" "When was your last menstrual period?"     no  Protocols used: Medication Refill and Renewal Call-A-AH

## 2021-12-16 NOTE — Telephone Encounter (Signed)
Called pt- LMOM 

## 2021-12-16 NOTE — Telephone Encounter (Signed)
Courtesy refill given, appointment needed.  Requested Prescriptions  Pending Prescriptions Disp Refills  . insulin glargine (LANTUS SOLOSTAR) 100 UNIT/ML Solostar Pen 45 mL 0    Sig: Inject 80 Units into the skin 2 (two) times daily. OFFICE VISIT NEEDED FOR ADDITIONAL REFILLS     Endocrinology:  Diabetes - Insulins Passed - 12/16/2021  5:01 PM      Passed - HBA1C is between 0 and 7.9 and within 180 days    Hemoglobin A1C  Date Value Ref Range Status  09/02/2021 7.4 (A) 4.0 - 5.6 % Final   Hgb A1c MFr Bld  Date Value Ref Range Status  05/05/2019 8.7 (H) 4.8 - 5.6 % Final    Comment:             Prediabetes: 5.7 - 6.4          Diabetes: >6.4          Glycemic control for adults with diabetes: <7.0          Passed - Valid encounter within last 6 months    Recent Outpatient Visits          3 months ago Diabetes mellitus without complication [facility])   [facility] [place], [place], [name]   7 months ago Type 2 diabetes mellitus with hyperglycemia, with long-term current use of insulin ([place])   [employer], Erin E, [name]   2 years ago Type 2 diabetes mellitus with diabetic neuropathy, with long-term current use of insulin ([place])   [facility] Birdie Sons, MD   4 years ago Type 2 diabetes mellitus with diabetic neuropathy, with long-term current use of insulin [facility])   [facility] Birdie Sons, MD   5 years ago Type 2 diabetes mellitus with diabetic neuropathy, with long-term current use of insulin [facility])   [facility] Caryn Section, Kirstie Peri, MD

## 2021-12-16 NOTE — Progress Notes (Signed)
Chronic Care Management APPOINTMENT REMINDER   Called Amy Meyers, No answer, left message of appointment on 12/19/2021 at 1:00 pm via office visit with Junius Argyle , Pharm D. Notified to have all medications, supplements, blood pressure and/or blood sugar logs available during appointment and to return call if need to reschedule.   [place] Pharmacist Assistant [phone]

## 2021-12-16 NOTE — Telephone Encounter (Signed)
Tried to call pt directly to triage pt- left messages at 2 numbers. The 3rd was call cannot be completed.  Routing to office for follow up.

## 2021-12-19 ENCOUNTER — Telehealth: Payer: Self-pay

## 2021-12-19 ENCOUNTER — Ambulatory Visit: Payer: Medicare Other

## 2021-12-19 NOTE — Progress Notes (Deleted)
Chronic Care Management Pharmacy Note  12/19/2021 Name:  Amy Meyers MRN:  [id number] DOB:  11/15/1960  Summary: Patient presents for CCM follow-up.  -Discussed benefits of SGLT-2 inhibitor in preventing CKD, patient was amenable.   Recommendations/Changes made from today's visit: -START Farxiga 10 mg daily, 30-day voucher left at front desk for patient to take to pharmacy.   Plan: -HC assessment in one week to ensure voucher picked up and medication started.  CPP follow-up 1 month   Subjective: Amy Meyers is an 61 y.o. year old female who is a primary patient of Fisher, Kirstie Peri, MD.  The CCM team was consulted for assistance with disease management and care coordination needs.    Engaged with patient by telephone for follow up visit in response to provider referral for pharmacy case management and/or care coordination services.   Consent to Services:  The patient was given information about Chronic Care Management services, agreed to services, and gave verbal consent prior to initiation of services.  Please see initial visit note for detailed documentation.   Patient Care Team: Birdie Sons, MD as PCP - General (Family Medicine) Mohammed Kindle, MD as Attending Physician (Pain Medicine) Samara Deist, DPM as Referring Physician (Podiatry) Germaine Pomfret, [facility] (Pharmacist)  Recent office visits: 09/02/21: Patient presented to [name], [name] for follow-up.   05/06/2021 Erin Mecum [name] (PCP Office) Change Lantus to 0.8 mLs (80 units total) into the skin 2 times daily, AMB Referral to [facility] Coordination,follow up in 3 months   Recent consult visits: No recent consult visit  Hospital visits: 11/05/21: Patient presented to ED for fever.     Objective:  Lab Results  Component Value Date   CREATININE 0.81 11/04/2021   BUN 20 11/04/2021   GFRNONAA >60 11/04/2021   GFRAA 100 05/05/2019   NA 136 11/04/2021   K 4.5 11/04/2021   CALCIUM 8.7 (L) 11/04/2021    CO2 32 11/04/2021   GLUCOSE 232 (H) 11/04/2021    Lab Results  Component Value Date/Time   HGBA1C 7.4 (A) 09/02/2021 04:21 PM   HGBA1C 11.8 (A) 05/06/2021 11:49 AM   HGBA1C 8.7 (H) 05/05/2019 03:48 PM   HGBA1C 6.7 (A) 01/28/2014 12:00 AM   MICROALBUR 100 09/28/2017 12:07 PM    Last diabetic Eye exam: No results found for: "HMDIABEYEEXA"  Last diabetic Foot exam: No results found for: "HMDIABFOOTEX"   Lab Results  Component Value Date   CHOL 176 05/27/2021   HDL 30 (L) 05/27/2021   LDLCALC 118 (H) 05/27/2021   TRIG 157 (H) 05/27/2021   CHOLHDL 5.9 (H) 05/27/2021       Latest Ref Rng & Units 11/04/2021   10:32 PM 05/27/2021    2:07 PM 05/05/2019    3:48 PM  Hepatic Function  Total Protein 6.5 - 8.1 g/dL 7.4  7.1  6.9   Albumin 3.5 - 5.0 g/dL 3.5  4.0  3.7   AST 15 - 41 U/L '17  11  11   ' ALT 0 - 44 U/L '21  12  8   ' Alk Phosphatase 38 - 126 U/L 64  84  73   Total Bilirubin 0.3 - 1.2 mg/dL 0.5  0.5  0.3     Lab Results  Component Value Date/Time   TSH 1.770 05/05/2019 03:48 PM       Latest Ref Rng & Units 11/04/2021   10:32 PM 05/27/2021    2:07 PM 06/23/2020    2:03 PM  CBC  WBC 4.0 - 10.5 K/uL 12.6  11.4  9.9   Hemoglobin 12.0 - 15.0 g/dL 14.1  15.1  15.5   Hematocrit 36.0 - 46.0 % 42.3  44.7  47.2   Platelets 150 - 400 K/uL 182  192  164     No results found for: "VD25OH"  Clinical ASCVD: No  The 10-year ASCVD risk score (Arnett DK, et al., 2019) is: 26.4%   Values used to calculate the score:     Age: 28 years     Sex: Female     Is Non-Hispanic African American: No     Diabetic: Yes     Tobacco smoker: Yes     Systolic Blood Pressure: 403 mmHg     Is BP treated: Yes     HDL Cholesterol: 30 mg/dL     Total Cholesterol: 176 mg/dL       09/02/2021    3:57 PM 05/05/2019    3:40 PM 06/23/2016   11:16 AM  Depression screen PHQ 2/9  Decreased Interest 1 3 0  Down, Depressed, Hopeless '2 3 3  ' PHQ - 2 Score '3 6 3  ' Altered sleeping '1 1 1  ' Tired, decreased  energy '1 3 3  ' Change in appetite '2 3 1  ' Feeling bad or failure about yourself  '2 3 3  ' Trouble concentrating 2 3 0  Moving slowly or fidgety/restless 0 1 0  Suicidal thoughts '2 1 1  ' PHQ-9 Score '13 21 12  ' Difficult doing work/chores Very difficult Very difficult Somewhat difficult    Social History   Tobacco Use  Smoking Status Every Day   Packs/day: 1.00   Years: 44.00   Total pack years: 44.00   Types: Cigarettes  Smokeless Tobacco Former   Types: Chew   BP Readings from Last 3 Encounters:  11/05/21 129/73  09/02/21 (!) 136/57  05/06/21 140/68   Pulse Readings from Last 3 Encounters:  11/05/21 (!) 104  09/02/21 74  05/06/21 88   Wt Readings from Last 3 Encounters:  11/04/21 195 lb (88.5 kg)  09/02/21 203 lb (92.1 kg)  05/06/21 191 lb (86.6 kg)   BMI Readings from Last 3 Encounters:  11/04/21 31.47 kg/m  09/02/21 33.78 kg/m  05/06/21 31.78 kg/m    Assessment/Interventions: Review of patient past medical history, allergies, medications, health status, including review of consultants reports, laboratory and other test data, was performed as part of comprehensive evaluation and provision of chronic care management services.   SDOH:  (Social Determinants of Health) assessments and interventions performed: Yes   SDOH Screenings   Alcohol Screen: Low Risk  (09/02/2021)   Alcohol Screen    Last Alcohol Screening Score (AUDIT): 0  Depression (PHQ2-9): Medium Risk (09/02/2021)   Depression (PHQ2-9)    PHQ-2 Score: 13  Financial Resource Strain: High Risk (06/13/2021)   Overall Financial Resource Strain (CARDIA)    Difficulty of Paying Living Expenses: Very hard  Food Insecurity: Not on file  Housing: Not on file  Physical Activity: Not on file  Social Connections: Not on file  Stress: Not on file  Tobacco Use: High Risk (09/04/2021)   Patient History    Smoking Tobacco Use: Every Day    Smokeless Tobacco Use: Former    Passive Exposure: Not on file  Transportation  Needs: Not on file    [place]  Allergies  Allergen Reactions   Meloxicam Itching, Swelling and Nausea And Vomiting   Sulfa Antibiotics Other (See Comments)  Medications Reviewed Today     Reviewed by Germaine Pomfret, [facility] (Pharmacist) on 10/04/21 at 1614  Med List Status: <None>   Medication Order Taking? Sig Documenting Provider Last Dose Status Informant  amphetamine-dextroamphetamine (ADDERALL) 20 MG tablet [id number] Yes Take 20 mg by mouth daily. [provider] Taking Active   Aspirin 81 MG CAPS [id number] Yes Take 81 mg by mouth daily. Reported on 07/30/2015 [provider] Taking Active Self  gabapentin (NEURONTIN) 400 MG capsule [id number] Yes Take 400 mg by mouth at bedtime as needed. [provider] Taking Active   insulin glargine (LANTUS) 100 UNIT/ML injection [id number] Yes INJECT 80 UNITS INTO THE SKIN 2 TIMES DAILY. PLEASE SCHEDULE OFFICE VISIT BEFORE ANY FUTURE REFILL. Birdie Sons, MD Taking Active   Insulin Syringe 27G X 1/2" 1 ML MISC [id number]  1 Units by Does not apply route daily. For insulin dependent type 2 diabetes Birdie Sons, MD  Active   methadone (DOLOPHINE) 10 MG tablet [id number] Yes Limit 5-7 tabs by mouth per day if tolerated  Patient taking differently: Take 10 mg by mouth. Limit 5-7 tabs by mouth per day if tolerated   Mohammed Kindle, MD Taking Active   [facility] 4 MG/0.1ML LIQD nasal spray kit [id number]   [provider]  Active   oxyCODONE (OXY IR/ROXICODONE) 5 MG immediate release tablet [id number] Yes Limit 4 - 6 tabs po / day for breakthrough pain while taking methadone if tolerated Mohammed Kindle, MD Taking Active   Med List Note Hart Rochester, RN 01/13/16 0820): UDS  09/16/15 MR 02/23/16 CS 01/12/2015              Patient Active Problem List   Diagnosis Date Noted   Hyperlipidemia associated with type 2 diabetes mellitus ([place]) 06/13/2021   Type 2 diabetes mellitus with  microalbuminuria, with long-term current use of insulin ([place]) 05/06/2021   Proteinuria 12/27/2016   Callous ulcer ([place]) 05/11/2015   Anxiety 04/13/2015   Arthritis, degenerative 04/06/2015   Neuropathy 04/06/2015   Back pain, chronic 04/06/2015   Abnormal ECG 03/17/2015   Hypertension 03/17/2015   NAFLD (nonalcoholic fatty liver disease) 03/17/2015   Morbid obesity ([place]) 03/17/2015   Palpitations 03/17/2015   Pulmonary edema 03/17/2015   PVC's (premature ventricular contractions) 03/17/2015   Breath shortness 03/17/2015   Headache, tension-type 03/17/2015   Type 2 diabetes mellitus with right diabetic foot ulcer ([place]) 01/06/2015   Facet syndrome, lumbar 11/26/2014   Sacroiliac joint dysfunction 11/26/2014   DDD (degenerative disc disease), thoracolumbar 09/29/2014   Diabetes mellitus due to underlying condition with diabetic neuropathy ([place]) 09/29/2014   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     Medication Assistance: Application for [place]  medication assistance program. in process.  Anticipated assistance start date TBD.  See plan of care for additional detail.  Compliance/Adherence/Medication fill history: Care Gaps: COVID 19 Vaccine Pneumococcal Vaccine Ophthalmology Exam  PAP smear- Modifier Colonoscopy Mammogram Shingrix Vaccine  Star-Rating Drugs: NA  Patient's preferred pharmacy is:  [facility] -  [place], [place], [place] [address] Phone: [phone] Fax: [phone]  [place], [place] [address] [place] [address] Phone: [phone] Fax: [phone]  [id number] [place], [address] [place]Phone: [phone]: [phone] [id number] [address] [place]one: [phone]: [address]one: [phone]: [phone] pill box? No Pt endorses 25% compliance  We discussed: Current pharmacy is preferred with insurance plan and patient is satisfied with pharmacy services Patient decided to: Continue current medication management strategy  Care Plan and Follow Up Patient Decision:  Patient agrees to Care Plan and Follow-up.  Plan: Face to Face appointment with care management team member scheduled for: 11/07/2021 at 1:00 PM  AlJunius ArgylePharmD, BCNew BraunfelsCPP  Clinical Pharmacist Practitioner  [phone] Barriers:  Unable to independently afford treatment regimen Unable to achieve control of diabetes  Suboptimal therapeutic regimen for HTN, HLD  Pharmacist Clinical Goal(s):  Patient will verbalize ability to afford treatment regimen achieve control of Diabetes as evidenced by A1c less than 7% adhere to plan to optimize therapeutic regimen for HTN, HLD as evidenced by report of adherence to recommended medication management changes through collaboration with PharmD and provider.   Interventions: 1:1 collaboration with FiBirdie SonsMD regarding development and update of comprehensive plan of care as evidenced by provider attestation and co-signature Inter-disciplinary care team collaboration (see longitudinal plan of care) Comprehensive medication  review performed; medication list updated in electronic medical record  Hypertension (BP goal <130/80) -Uncontrolled -Current treatment: None -Medications previously tried: Lisinopril, Carvedilol   -Current home readings: Does not monitor at home.  -Would recommend ACEi or ARB given history of microalbuminuria -Continue current medications for now  Hyperlipidemia: (LDL goal < 70) -Uncontrolled -Current treatment: Aspirin 81 mg daily  -Medications previously tried: NA  -inconsistently taking  -Patient is a candidate for high intensity statin given T2DM and ASCVD risk > 20%. unable to discuss with patient prior to phone disconnecting. Will discuss at next follow-up.  -Recommend rosuvastatin 20 mg daily at next follow-up.   Diabetes (A1c goal <7%) -Uncontrolled, but significantly improved.  -Diagnosed 1029+ears,  -Complicated by: Neuropathy, Microalbuminuria, -Current medications: Farxiga 10 mg daily before breakfast Lantus 80 units twice daily (1.84 u/kg): Appropriate, Effective, Query safe -Medications previously tried: Metformin -Current home glucose readings fasting glucose: does not have a glucometer. Replacement sent in today.  -Denies hypoglycemic symptoms -Current meal patterns:  breakfast: Typically skips  lunch: Pimento Cheese Sandwich   dinner: Microwavable meals drinks: Coffee, water, diet soda.  -Current exercise: Minimal  Anxiety/ ADHD (Goal: Minimize symptoms) -Controlled -Managed by SuMyer HaffMD -Current treatment: Adderrall 20 mg: Appropriate, Effective, Query Safe -Medications previously tried/failed: NA -Discussed impact of stimulants on blood pressure. -Recommended to continue current medication  Chronic Pain (Goal: Minimize pain symptoms) -Controlled -Managed by CrMohammed KindleMD -Current treatment  Gabapentin 400 mg nightly as needed: Appropriate, Query effective Methadone 10 mg: Appropriate, Query effective Oxycodone 5 mg: Appropriate, Query  effective  -Medications previously tried: NA -Unable to discuss pain management prior to phone  call disconnecting.   -Recommended to continue current medication  Patient Goals/Self-Care Activities Patient will:  - check glucose daily before breakfast, document, and provide at future appointments check blood pressure weekly, document, and provide at future appointments  Follow Up Plan: Face to Face appointment with care management team member scheduled for: 11/07/2021 at 1:00 PM

## 2021-12-19 NOTE — Telephone Encounter (Signed)
Requested medication (s) are due for refill today: No  Requested medication (s) are on the active medication list: Yes  Last refill:  12/16/21  Future visit scheduled: No  Notes to clinic:  Pharmacy requests substitution.     Requested Prescriptions  Pending Prescriptions Disp Refills   insulin glargine-yfgn (SEMGLEE) 100 UNIT/ML Pen [Pharmacy Med Name: INSULIN GLARGINE-YFGN U100 PEN]  0    Sig: INJECT 80 UNITS INTO THE SKIN 2 (TWO) TIMES DAILY. OFFICE VISIT NEEDED FOR ADDITIONAL REFILLS     Off-Protocol Failed - 12/16/2021  5:11 PM      Failed - Medication not assigned to a protocol, review manually.      Passed - Valid encounter within last 12 months    Recent Outpatient Visits           3 months ago Diabetes mellitus without complication [facility])   [facility] [place], Ken Caryl, [name]   7 months ago Type 2 diabetes mellitus with hyperglycemia, with long-term current use of insulin ([place])   [employer], Erin E, [name]   2 years ago Type 2 diabetes mellitus with diabetic neuropathy, with long-term current use of insulin ([place])   [facility] Birdie Sons, MD   4 years ago Type 2 diabetes mellitus with diabetic neuropathy, with long-term current use of insulin [facility])   [facility] Birdie Sons, MD   5 years ago Type 2 diabetes mellitus with diabetic neuropathy, with long-term current use of insulin [facility])   [facility] Caryn Section, Kirstie Peri, MD

## 2021-12-19 NOTE — Telephone Encounter (Signed)
  Care Management   Follow Up Note   12/19/2021 Name: Amy Meyers MRN: [id number] DOB: Mar 25, 1961   Referred by: Birdie Sons, MD Reason for referral : No chief complaint on file.   A second unsuccessful telephone outreach was attempted today. The patient was referred to the case management team for assistance with care management and care coordination.   Follow Up Plan: A HIPPA compliant phone message was left for the patient providing contact information and requesting a return call.   Junius Argyle, PharmD, Para [place], CPP  Clinical Pharmacist Practitioner  [facility] [phone]

## 2021-12-21 DIAGNOSIS — M179 Osteoarthritis of knee, unspecified: Secondary | ICD-10-CM | POA: Diagnosis not present

## 2021-12-21 DIAGNOSIS — M5136 Other intervertebral disc degeneration, lumbar region: Secondary | ICD-10-CM | POA: Diagnosis not present

## 2021-12-21 DIAGNOSIS — M259 Joint disorder, unspecified: Secondary | ICD-10-CM | POA: Diagnosis not present

## 2021-12-21 DIAGNOSIS — G894 Chronic pain syndrome: Secondary | ICD-10-CM | POA: Diagnosis not present

## 2021-12-27 ENCOUNTER — Telehealth: Payer: Self-pay

## 2021-12-27 NOTE — Progress Notes (Signed)
Chronic Care Management Pharmacy Assistant   Name: Amy Meyers  MRN: [id number] DOB: 03-31-1961  Reason for Encounter:Diabetes Disease State Call.   Recent office visits:  No recent office visit   Recent consult visits:  No recent consult visit  Hospital visits:  None in previous 6 months  Medications: Outpatient Encounter Medications as of 12/27/2021  Medication Sig   amphetamine-dextroamphetamine (ADDERALL) 20 MG tablet Take 20 mg by mouth daily.   Aspirin 81 MG CAPS Take 81 mg by mouth daily. Reported on 07/30/2015   blood glucose meter kit and supplies KIT Dispense based on patient and insurance preference. Use to monitor blood sugars once daily as directed.   Blood Glucose Monitoring Suppl (ACCU-CHEK AVIVA PLUS) w/Device KIT Use to check blood sugar daily for type 2 diabetes. E11.9   cephALEXin (KEFLEX) 500 MG capsule Take 1 capsule (500 mg total) by mouth 2 (two) times daily.   dapagliflozin propanediol (FARXIGA) 10 MG TABS tablet Take 1 tablet (10 mg total) by mouth daily before breakfast. Patient receives via [place] Patient Assistance   gabapentin (NEURONTIN) 400 MG capsule Take 400 mg by mouth at bedtime as needed.   glucose blood (ACCU-CHEK AVIVA PLUS) test strip Use to check blood sugar once a day   insulin glargine (LANTUS SOLOSTAR) 100 UNIT/ML Solostar Pen Inject 80 Units into the skin 2 (two) times daily. OFFICE VISIT NEEDED FOR ADDITIONAL REFILLS   Insulin Syringe 27G X 1/2" 1 ML MISC 1 Units by Does not apply route daily. For insulin dependent type 2 diabetes   methadone (DOLOPHINE) 10 MG tablet Limit 5-7 tabs by mouth per day if tolerated (Patient taking differently: Take 10 mg by mouth. Limit 5-7 tabs by mouth per day if tolerated)   NARCAN 4 MG/0.1ML LIQD nasal spray kit    oxyCODONE (OXY IR/ROXICODONE) 5 MG immediate release tablet Limit 4 - 6 tabs po / day for breakthrough pain while taking methadone if tolerated   No facility-administered encounter medications on  file as of 12/27/2021.    Care Gaps: COVID 19 Vaccine Ophthalmology Exam  Colonoscopy Mammogram Shingrix Vaccine     Star Rating Drugs: Farxiga 10 mg last [facility] Pioglitazone 15 mg not on fill report   Medication Fill Gaps: None ID  Recent Relevant Labs: Lab Results  Component Value Date/Time   HGBA1C 7.4 (A) 09/02/2021 04:21 PM   HGBA1C 11.8 (A) 05/06/2021 11:49 AM   HGBA1C 8.7 (H) 05/05/2019 03:48 PM   HGBA1C 6.7 (A) 01/28/2014 12:00 AM   MICROALBUR 100 09/28/2017 12:07 PM    Kidney Function Lab Results  Component Value Date/Time   CREATININE 0.81 11/04/2021 10:32 PM   CREATININE 0.80 05/27/2021 02:07 PM   CREATININE 0.79 04/25/2014 01:15 PM   CREATININE 0.75 11/17/2013 11:55 AM   GFRNONAA >60 11/04/2021 10:32 PM   GFRNONAA >60 04/25/2014 01:15 PM   GFRNONAA >60 11/17/2013 11:55 AM   GFRAA 100 05/05/2019 03:48 PM   GFRAA >60 04/25/2014 01:15 PM   GFRAA >60 11/17/2013 11:55 AM    Current antihyperglycemic regimen:  Lantus 80 units twice daily  What recent interventions/DTPs have been made to improve glycemic control:  None ID  Have there been any recent hospitalizations or ED visits since last visit with CPP? No  I have attempted without success to contact this patient by phone three times to do her Diabetes Disease State call. I left a Voice message for patient to return my  call.  Adherence Review: Is the patient currently on a STATIN medication? No Is the patient currently on ACE/ARB medication? No Does the patient have >5 day gap between last estimated fill dates? No   Anderson Malta Clinical Production designer, theatre/television/film [phone]

## 2022-01-04 ENCOUNTER — Telehealth: Payer: Self-pay | Admitting: Family Medicine

## 2022-01-04 NOTE — Telephone Encounter (Signed)
Copied from [place] [id number]. Topic: Medicare AWV >> Jan 04, 2022  1:43 PM Jae Dire wrote: Reason for CRM:  Left message for patient to call back and schedule Medicare Annual Wellness Visit (AWV) in office.   If unable to come into the office for AWV,  please offer to do virtually or by telephone.  Last AWV: 06/23/2016  Please schedule at anytime with [facility] Health Advisor.  30 minute appointment for Virtual or phone 45 minute appointment for in office or Initial virtual/phone  Any questions, please contact me at [phone]

## 2022-01-10 DIAGNOSIS — F900 Attention-deficit hyperactivity disorder, predominantly inattentive type: Secondary | ICD-10-CM | POA: Diagnosis not present

## 2022-01-10 DIAGNOSIS — F339 Major depressive disorder, recurrent, unspecified: Secondary | ICD-10-CM | POA: Diagnosis not present

## 2022-01-23 DIAGNOSIS — M259 Joint disorder, unspecified: Secondary | ICD-10-CM | POA: Diagnosis not present

## 2022-01-23 DIAGNOSIS — M19012 Primary osteoarthritis, left shoulder: Secondary | ICD-10-CM | POA: Diagnosis not present

## 2022-01-23 DIAGNOSIS — G894 Chronic pain syndrome: Secondary | ICD-10-CM | POA: Diagnosis not present

## 2022-01-23 DIAGNOSIS — M5136 Other intervertebral disc degeneration, lumbar region: Secondary | ICD-10-CM | POA: Diagnosis not present

## 2022-01-31 ENCOUNTER — Telehealth: Payer: Self-pay

## 2022-01-31 NOTE — Progress Notes (Signed)
Chronic Care Management Pharmacy Assistant   Name: Amy Meyers  MRN: [id number] DOB: [birth year]  Reason for Encounter:Diabetes Disease State Call.   Recent office visits:  None ID  Recent consult visits:  None ID  Hospital visits:  None in previous 6 months  Medications: Outpatient Encounter Medications as of 01/31/2022  Medication Sig   amphetamine-dextroamphetamine (ADDERALL) 20 MG tablet Take 20 mg by mouth daily.   Aspirin 81 MG CAPS Take 81 mg by mouth daily. Reported on 07/30/2015   blood glucose meter kit and supplies KIT Dispense based on patient and insurance preference. Use to monitor blood sugars once daily as directed.   Blood Glucose Monitoring Suppl (ACCU-CHEK AVIVA PLUS) w/Device KIT Use to check blood sugar daily for type 2 diabetes. E11.9   cephALEXin (KEFLEX) 500 MG capsule Take 1 capsule (500 mg total) by mouth 2 (two) times daily.   dapagliflozin propanediol (FARXIGA) 10 MG TABS tablet Take 1 tablet (10 mg total) by mouth daily before breakfast. Patient receives via [place] Patient Assistance   gabapentin (NEURONTIN) 400 MG capsule Take 400 mg by mouth at bedtime as needed.   glucose blood (ACCU-CHEK AVIVA PLUS) test strip Use to check blood sugar once a day   insulin glargine (LANTUS SOLOSTAR) 100 UNIT/ML Solostar Pen Inject 80 Units into the skin 2 (two) times daily. OFFICE VISIT NEEDED FOR ADDITIONAL REFILLS   Insulin Syringe 27G X 1/2" 1 ML MISC 1 Units by Does not apply route daily. For insulin dependent type 2 diabetes   methadone (DOLOPHINE) 10 MG tablet Limit 5-7 tabs by mouth per day if tolerated (Patient taking differently: Take 10 mg by mouth. Limit 5-7 tabs by mouth per day if tolerated)   NARCAN 4 MG/0.1ML LIQD nasal spray kit    oxyCODONE (OXY IR/ROXICODONE) 5 MG immediate release tablet Limit 4 - 6 tabs po / day for breakthrough pain while taking methadone if tolerated   No facility-administered encounter medications on file as of 01/31/2022.    Care  Gaps: COVID 19 Vaccine Ophthalmology Exam  Colonoscopy Mammogram Shingrix Vaccine     Star Rating Drugs: Farxiga 10 mg last filled 10/04/20[facility].(Approved through [place])    Medication Fill Gaps: None ID  Recent Relevant Labs: Lab Results  Component Value Date/Time   HGBA1C 7.4 (A) 09/02/2021 04:21 PM   HGBA1C 11.8 (A) 05/06/2021 11:49 AM   HGBA1C 8.7 (H) 05/05/2019 03:48 PM   HGBA1C 6.7 (A) 01/28/2014 12:00 AM   MICROALBUR 100 09/28/2017 12:07 PM    Kidney Function Lab Results  Component Value Date/Time   CREATININE 0.81 11/04/2021 10:32 PM   CREATININE 0.80 05/27/2021 02:07 PM   CREATININE 0.79 04/25/2014 01:15 PM   CREATININE 0.75 11/17/2013 11:55 AM   GFRNONAA >60 11/04/2021 10:32 PM   GFRNONAA >60 04/25/2014 01:15 PM   GFRNONAA >60 11/17/2013 11:55 AM   GFRAA 100 05/05/2019 03:48 PM   GFRAA >60 04/25/2014 01:15 PM   GFRAA >60 11/17/2013 11:55 AM    Current antihyperglycemic regimen:  Lantus 80 units twice daily Farxiga 10 mg 1 tablet daily  What recent interventions/DTPs have been made to improve glycemic control:  None ID  Have there been any recent hospitalizations or ED visits since last visit with CPP? No  I have attempted without success to contact this patient by phone three times to do her Diabetes Disease State call. I left a Voice message for patient to return my call.  Adherence Review: Is the patient currently on a STATIN medication? No Is the patient currently on ACE/ARB medication? No Does the patient have >5 day gap between last estimated fill dates? No   Anderson Malta Clinical Production designer, theatre/television/film [phone]

## 2022-02-05 ENCOUNTER — Encounter: Payer: Self-pay | Admitting: Family Medicine

## 2022-02-06 ENCOUNTER — Telehealth: Payer: Self-pay | Admitting: Family Medicine

## 2022-02-06 NOTE — Telephone Encounter (Signed)
Copied from [place] [phone]. Topic: Medicare AWV >> Feb 06, 2022  3:23 PM Jae Dire wrote: Reason for CRM:  Left message for patient to call back and schedule Medicare Annual Wellness Visit (AWV) in office.   If unable to come into the office for AWV,  please offer to do virtually or by telephone.  Last AWV: 06/23/2016  Please schedule at anytime with [facility] Health Advisor.  30 minute appointment for Virtual or phone 45 minute appointment for in office or Initial virtual/phone  Any questions, please contact me at [phone]

## 2022-02-20 DIAGNOSIS — G894 Chronic pain syndrome: Secondary | ICD-10-CM | POA: Diagnosis not present

## 2022-02-20 DIAGNOSIS — M19012 Primary osteoarthritis, left shoulder: Secondary | ICD-10-CM | POA: Diagnosis not present

## 2022-02-20 DIAGNOSIS — M259 Joint disorder, unspecified: Secondary | ICD-10-CM | POA: Diagnosis not present

## 2022-02-20 DIAGNOSIS — M5136 Other intervertebral disc degeneration, lumbar region: Secondary | ICD-10-CM | POA: Diagnosis not present

## 2022-02-22 ENCOUNTER — Telehealth: Payer: Self-pay | Admitting: Family Medicine

## 2022-02-22 NOTE — Telephone Encounter (Signed)
Copied from [place] [phone]. Topic: Medicare AWV >> Feb 22, 2022  2:02 PM Jae Dire wrote: Reason for CRM:  Left message for patient to call back and schedule Medicare Annual Wellness Visit (AWV) in office.   If unable to come into the office for AWV,  please offer to do virtually or by telephone.  Last AWV:  06/23/2016  Please schedule at anytime with [facility] Health Advisor.  30 minute appointment for Virtual or phone 45 minute appointment for in office or Initial virtual/phone  Any questions, please contact me at [phone]

## 2022-03-03 ENCOUNTER — Telehealth: Payer: Self-pay

## 2022-03-03 NOTE — Progress Notes (Signed)
Chronic Care Management Pharmacy Assistant   Name: Amy Meyers  MRN: [id number] DOB: 01-Nov-1960  Reason for Encounter:Diabetes Disease State Call.   Recent office visits:  None ID  Recent consult visits:  None ID  Hospital visits:  None in previous 6 months  Medications: Outpatient Encounter Medications as of 03/03/2022  Medication Sig   amphetamine-dextroamphetamine (ADDERALL) 20 MG tablet Take 20 mg by mouth daily.   Aspirin 81 MG CAPS Take 81 mg by mouth daily. Reported on 07/30/2015   blood glucose meter kit and supplies KIT Dispense based on patient and insurance preference. Use to monitor blood sugars once daily as directed.   Blood Glucose Monitoring Suppl (ACCU-CHEK AVIVA PLUS) w/Device KIT Use to check blood sugar daily for type 2 diabetes. E11.9   cephALEXin (KEFLEX) 500 MG capsule Take 1 capsule (500 mg total) by mouth 2 (two) times daily.   dapagliflozin propanediol (FARXIGA) 10 MG TABS tablet Take 1 tablet (10 mg total) by mouth daily before breakfast. Patient receives via [place] Patient Assistance   gabapentin (NEURONTIN) 400 MG capsule Take 400 mg by mouth at bedtime as needed.   glucose blood (ACCU-CHEK AVIVA PLUS) test strip Use to check blood sugar once a day   insulin glargine (LANTUS SOLOSTAR) 100 UNIT/ML Solostar Pen Inject 80 Units into the skin 2 (two) times daily. OFFICE VISIT NEEDED FOR ADDITIONAL REFILLS   Insulin Syringe 27G X 1/2" 1 ML MISC 1 Units by Does not apply route daily. For insulin dependent type 2 diabetes   methadone (DOLOPHINE) 10 MG tablet Limit 5-7 tabs by mouth per day if tolerated (Patient taking differently: Take 10 mg by mouth. Limit 5-7 tabs by mouth per day if tolerated)   NARCAN 4 MG/0.1ML LIQD nasal spray kit    oxyCODONE (OXY IR/ROXICODONE) 5 MG immediate release tablet Limit 4 - 6 tabs po / day for breakthrough pain while taking methadone if tolerated   No facility-administered encounter medications on file as of 03/03/2022.   Care  Gaps: COVID 19 Vaccine Ophthalmology Exam  Colonoscopy Mammogram Shingrix Vaccine     Star Rating Drugs: Farxiga 10 mg last filled 10[facility]04/2021 [facility]Approved through [place])     Medication Fill Gaps: None ID  Recent Relevant Labs: Lab Results  Component Value Date/Time   HGBA1C 7.4 (A) 09/02/2021 04:21 PM   HGBA1C 11.8 (A) 05/06/2021 11:49 AM   HGBA1C 8.7 (H) 05/05/2019 03:48 PM   HGBA1C 6.7 (A) 01/28/2014 12:00 AM   MICROALBUR 100 09/28/2017 12:07 PM    Kidney Function Lab Results  Component Value Date/Time   CREATININE 0.81 11/04/2021 10:32 PM   CREATININE 0.80 05/27/2021 02:07 PM   CREATININE 0.79 04/25/2014 01:15 PM   CREATININE 0.75 11/17/2013 11:55 AM   GFRNONAA >60 11/04/2021 10:32 PM   GFRNONAA >60 04/25/2014 01:15 PM   GFRNONAA >60 11/17/2013 11:55 AM   GFRAA 100 05/05/2019 03:48 PM   GFRAA >60 04/25/2014 01:15 PM   GFRAA >60 11/17/2013 11:55 AM    Current antihyperglycemic regimen:  Lantus 80 units twice daily Farxiga 10 mg 1 tablet daily  What recent interventions/DTPs have been made to improve glycemic control:  None ID  Have there been any recent hospitalizations or ED visits since last visit with CPP? No  I have attempted without success to contact this patient by phone three times to do her Diabetes Disease State call. I left a Voice message for patient to return my call.  Adherence  Review: Is the patient currently on a STATIN medication? No Is the patient currently on ACE/ARB medication? No Does the patient have >5 day gap between last estimated fill dates? No   Anderson [place] Clinical Production designer, theatre/television/film [phone]

## 2022-03-16 ENCOUNTER — Telehealth: Payer: Self-pay | Admitting: Family Medicine

## 2022-03-16 NOTE — Telephone Encounter (Signed)
Copied from [place] [phone]. Topic: Medicare AWV >> Mar 16, 2022 11:43 AM Jae Dire wrote: Reason for CRM:  No answer unable to leave a message for patient to call back and schedule Medicare Annual Wellness Visit (AWV) in office.   If unable to come into the office for AWV,  please offer to do virtually or by telephone.  Last AWV:  06/23/2016  Please schedule at anytime with [facility] Health Advisor.  30 minute appointment for Virtual or phone 45 minute appointment for in office or Initial virtual/phone  Any questions, please contact me at [phone]

## 2022-03-20 DIAGNOSIS — M19012 Primary osteoarthritis, left shoulder: Secondary | ICD-10-CM | POA: Diagnosis not present

## 2022-03-20 DIAGNOSIS — M4726 Other spondylosis with radiculopathy, lumbar region: Secondary | ICD-10-CM | POA: Diagnosis not present

## 2022-03-20 DIAGNOSIS — Z79891 Long term (current) use of opiate analgesic: Secondary | ICD-10-CM | POA: Diagnosis not present

## 2022-03-20 DIAGNOSIS — E0840 Diabetes mellitus due to underlying condition with diabetic neuropathy, unspecified: Secondary | ICD-10-CM | POA: Diagnosis not present

## 2022-03-20 DIAGNOSIS — G8929 Other chronic pain: Secondary | ICD-10-CM | POA: Diagnosis not present

## 2022-03-20 DIAGNOSIS — M5136 Other intervertebral disc degeneration, lumbar region: Secondary | ICD-10-CM | POA: Diagnosis not present

## 2022-03-20 DIAGNOSIS — M47897 Other spondylosis, lumbosacral region: Secondary | ICD-10-CM | POA: Diagnosis not present

## 2022-03-20 DIAGNOSIS — E6609 Other obesity due to excess calories: Secondary | ICD-10-CM | POA: Diagnosis not present

## 2022-03-20 DIAGNOSIS — M259 Joint disorder, unspecified: Secondary | ICD-10-CM | POA: Diagnosis not present

## 2022-03-20 DIAGNOSIS — M179 Osteoarthritis of knee, unspecified: Secondary | ICD-10-CM | POA: Diagnosis not present

## 2022-03-20 DIAGNOSIS — G894 Chronic pain syndrome: Secondary | ICD-10-CM | POA: Diagnosis not present

## 2022-04-10 DIAGNOSIS — Z5181 Encounter for therapeutic drug level monitoring: Secondary | ICD-10-CM | POA: Diagnosis not present

## 2022-04-19 ENCOUNTER — Other Ambulatory Visit: Payer: Self-pay | Admitting: Family Medicine

## 2022-04-19 DIAGNOSIS — Z794 Long term (current) use of insulin: Secondary | ICD-10-CM

## 2022-04-19 NOTE — Telephone Encounter (Signed)
Patient is overdue for follow up visit. Tried calling patient. Left message to call back. [place] for [facility] triage to advise and schedule appointment.

## 2022-04-24 DIAGNOSIS — G894 Chronic pain syndrome: Secondary | ICD-10-CM | POA: Diagnosis not present

## 2022-04-24 DIAGNOSIS — F339 Major depressive disorder, recurrent, unspecified: Secondary | ICD-10-CM | POA: Diagnosis not present

## 2022-04-24 DIAGNOSIS — Z79891 Long term (current) use of opiate analgesic: Secondary | ICD-10-CM | POA: Diagnosis not present

## 2022-04-24 DIAGNOSIS — F900 Attention-deficit hyperactivity disorder, predominantly inattentive type: Secondary | ICD-10-CM | POA: Diagnosis not present

## 2022-04-24 DIAGNOSIS — G8929 Other chronic pain: Secondary | ICD-10-CM | POA: Diagnosis not present

## 2022-04-24 DIAGNOSIS — M179 Osteoarthritis of knee, unspecified: Secondary | ICD-10-CM | POA: Diagnosis not present

## 2022-04-24 DIAGNOSIS — M259 Joint disorder, unspecified: Secondary | ICD-10-CM | POA: Diagnosis not present

## 2022-04-24 DIAGNOSIS — M19012 Primary osteoarthritis, left shoulder: Secondary | ICD-10-CM | POA: Diagnosis not present

## 2022-04-24 DIAGNOSIS — M545 Low back pain, unspecified: Secondary | ICD-10-CM | POA: Diagnosis not present

## 2022-05-09 DIAGNOSIS — M129 Arthropathy, unspecified: Secondary | ICD-10-CM | POA: Diagnosis not present

## 2022-05-09 DIAGNOSIS — E559 Vitamin D deficiency, unspecified: Secondary | ICD-10-CM | POA: Diagnosis not present

## 2022-05-09 DIAGNOSIS — Z1159 Encounter for screening for other viral diseases: Secondary | ICD-10-CM | POA: Diagnosis not present

## 2022-05-09 DIAGNOSIS — Z79899 Other long term (current) drug therapy: Secondary | ICD-10-CM | POA: Diagnosis not present

## 2022-05-12 ENCOUNTER — Other Ambulatory Visit: Payer: Self-pay | Admitting: Family Medicine

## 2022-05-12 DIAGNOSIS — E1165 Type 2 diabetes mellitus with hyperglycemia: Secondary | ICD-10-CM

## 2022-05-12 NOTE — Telephone Encounter (Signed)
Call all 3 of pt's phone numbers without success.

## 2022-05-12 NOTE — Telephone Encounter (Signed)
Requested medications are due for refill today.  Unsure  Requested medications are on the active medications list.  yes  Last refill. 12/16/2021 47m 0 rf  Future visit scheduled.   no  Notes to clinic.  Called pt on all 3 numbers unable to contact. Pt needs appt.    Requested Prescriptions  Pending Prescriptions Disp Refills   LANTUS SOLOSTAR 100 UNIT/ML Solostar Pen [Pharmacy Med Name: LANTUS SOLOSTAR 100 UNIT/ML]  1    Sig: INJECT [id number] FarmsA DAY     Endocrinology:  Diabetes - Insulins Failed - 05/12/2022  1:03 PM      Failed - HBA1C is between 0 and 7.9 and within 180 days    Hemoglobin A1C  Date Value Ref Range Status  09/02/2021 7.4 (A) 4.0 - 5.6 % Final   Hgb A1c MFr Bld  Date Value Ref Range Status  05/05/2019 8.7 (H) 4.8 - 5.6 % Final    Comment:             Prediabetes: 5.7 - 6.4          Diabetes: >6.4          Glycemic control for adults with diabetes: <7.0          Failed - Valid encounter within last 6 months    Recent Outpatient Visits           8 months ago Diabetes mellitus without complication ([facility]   [facility] [place] [name]   1 year ago Type 2 diabetes mellitus with hyperglycemia, with long-term current use of insulin ([place]   [facility] Erin E, [name]   3 years ago Type 2 diabetes mellitus with diabetic neuropathy, with long-term current use of insulin ([place]   [facility]   4 years ago Type 2 diabetes mellitus with diabetic neuropathy, with long-term current use of insulin ([facility]   [facility]   5 years ago Type 2 diabetes mellitus with diabetic neuropathy, with long-term current use of insulin ([facility]   [facility] D[facility]irstie Peri MD

## 2022-05-16 DIAGNOSIS — Z79899 Other long term (current) drug therapy: Secondary | ICD-10-CM | POA: Diagnosis not present

## 2022-05-31 DIAGNOSIS — Z79899 Other long term (current) drug therapy: Secondary | ICD-10-CM | POA: Diagnosis not present

## 2022-06-08 ENCOUNTER — Ambulatory Visit: Payer: Self-pay

## 2022-06-08 ENCOUNTER — Other Ambulatory Visit: Payer: Self-pay | Admitting: Family Medicine

## 2022-06-08 ENCOUNTER — Telehealth: Payer: Self-pay | Admitting: Family Medicine

## 2022-06-08 DIAGNOSIS — Z794 Long term (current) use of insulin: Secondary | ICD-10-CM

## 2022-06-08 NOTE — Telephone Encounter (Signed)
Requested medication (s) are due for refill today:Yes  Requested medication (s) are on the active medication list: Yes  Last refill:  05/14/22  Future visit scheduled: No  Notes to clinic:  Unable to refill per protocol, appointment needed. See Nurse Triage encounter for report of high blood sugar due to not having medication.     Requested Prescriptions  Pending Prescriptions Disp Refills   LANTUS SOLOSTAR 100 UNIT/ML Solostar Pen [Pharmacy Med Name: LANTUS SOLOSTAR 100 UNIT/ML]      Sig: INJECT 80 UNITS INTO THE SKIN 2 (TWO) TIMES DAILY. OFFICE VISIT NEEDED FOR ADDITIONAL REFILLS     Endocrinology:  Diabetes - Insulins Failed - 06/08/2022  4:48 PM      Failed - HBA1C is between 0 and 7.9 and within 180 days    Hemoglobin A1C  Date Value Ref Range Status  09/02/2021 7.4 (A) 4.0 - 5.6 % Final   Hgb A1c MFr Bld  Date Value Ref Range Status  05/05/2019 8.7 (H) 4.8 - 5.6 % Final    Comment:             Prediabetes: 5.7 - 6.4          Diabetes: >6.4          Glycemic control for adults with diabetes: <7.0          Failed - Valid encounter within last 6 months    Recent Outpatient Visits           9 months ago Diabetes mellitus without complication [facility])   [facility] [place], [place], [name]   1 year ago Type 2 diabetes mellitus with hyperglycemia, with long-term current use of insulin ([place])   [employer], Erin E, [name]   3 years ago Type 2 diabetes mellitus with diabetic neuropathy, with long-term current use of insulin ([place])   [facility] Birdie Sons, MD   4 years ago Type 2 diabetes mellitus with diabetic neuropathy, with long-term current use of insulin [facility])   [facility] Birdie Sons, MD   5 years ago Type 2 diabetes mellitus with diabetic neuropathy, with long-term current use of insulin [facility])   [facility] Caryn Section, Kirstie Peri, MD

## 2022-06-08 NOTE — Telephone Encounter (Signed)
Copied from [place] [phone]. Topic: General - Other >> Jun 08, 2022  4:52 PM [place] C wrote: Reason for CRM: Medication Refill - Medication: insulin glargine (LANTUS SOLOSTAR) 100 UNIT/ML Solostar Pen [[id number]]  Insulin Syringe 27G X 1/2" 1 ML MISC [[id number]]  Has the patient contacted their pharmacy? Yes.   (Agent: If no, request that the patient contact the pharmacy for the refill. If patient does not wish to contact the pharmacy document the reason why and proceed with request.) (Agent: If yes, when and what did the pharmacy advise?)  Preferred Pharmacy (with phone number or street name[id number] [place] [address]: [phone]: [phone]: Not open 24 hours   Has the patient been seen for an appointment in the last year OR does the patient have an upcoming appointment? Yes.    Agent: Please be advised that RX refills may take up to 3 business days. We ask that you follow-up with your pharmacy.

## 2022-06-08 NOTE — Telephone Encounter (Signed)
Spoke to patient's daughter on DPR, appointment scheduled for tomorrow, see Nurse Triage note.

## 2022-06-08 NOTE — Telephone Encounter (Addendum)
Chief Complaint: High blood sugar due to no insulin x 2 days Symptoms: sleepy, laying around not wanting to do anything Frequency: No insulin x 2 days Pertinent Negatives: Patient denies other symptoms Disposition: '[]'$ ED /'[]'$ Urgent Care (no appt availability in office) / '[x]'$ Appointment(In office/virtual)/ '[]'$  [place] [facility]/ '[]'$ Home Care/ '[]'$ Refused Recommended Disposition /'[]'$ [place] Mobile [facility]/ '[]'$  Follow-up with PCP Additional Notes: Patient's daughter Benjamine Mola, on [place], called to ask for the refill of patient's insulin. I told her the patient called at 1645 reporting high blood sugar over 350, daughter is not with the patient. I advised she was overdue for a follow up in the office and that's why her insulin has not been refilled, scheduled for tomorrow at 1540 with [name], PA. Advised to keep that appointment for additional refills, courtesy refill sent to pharmacy, advised to monitor blood sugar and if it's over 400 and/or patient has symptoms to go to the ED. She verbalized understanding.  Reason for Disposition  [1] Symptoms of high blood sugar (e.g., abnormally thirsty, frequent urination, weight loss) AND [2] not able to test blood glucose  Answer Assessment - Initial Assessment Questions 1. BLOOD GLUCOSE: "What is your blood glucose level?"      Over 350 2. ONSET: "When did you check the blood glucose?"     This afternoon 3. INSULIN: "Do you take insulin?" "What type of insulin(s) do you use? What is the mode of delivery? (syringe, pen; injection or pump)?"      Yes 4. DIABETES PILLS: "Do you take any pills for your diabetes?" If Yes, ask: "Have you missed taking any pills recently?"     No 5. OTHER SYMPTOMS: "Do you have any symptoms?" (e.g., fever, frequent urination, difficulty breathing, dizziness, weakness, vomiting)     Sleepy  Protocols used: Diabetes - High Blood Sugar-A-AH

## 2022-06-08 NOTE — Telephone Encounter (Signed)
Patient called, no answer, voicemail not set up on work number which is the number she called to request a refill of insulin, see TE today. Patient will need OV per refill protocol if she calls back. Unable to reach patient after 3 attempts by [facility] NT, routing to the provider for resolution per protocol.   Summary: blood sugar   The patient has called to share that their Blood Sugar was over 350 when checked at 4:45 PM approximately 06/08/22  The patient would like to speak with a member of clinical staff regarding their sugar concerns when possible  Please contact further when available

## 2022-06-08 NOTE — Telephone Encounter (Signed)
Pt called, unable to LVM on both #s d/t VM not set up.   Summary: blood sugar   The patient has called to share that their Blood Sugar was over 350 when checked at 4:45 PM approximately 06/08/22  The patient would like to speak with a member of clinical staff regarding their sugar concerns when possible  Please contact further when available

## 2022-06-08 NOTE — Telephone Encounter (Signed)
Duplicate request. If patient returns the call, an OV is needed before any refills per refill protocol.

## 2022-06-09 ENCOUNTER — Ambulatory Visit: Payer: Medicare Other | Admitting: Physician Assistant

## 2022-06-09 NOTE — Progress Notes (Deleted)
I,Imajean Mcdermid S Dorleen Kissel,acting as a Education administrator for [employer], [name].,have documented all relevant documentation on the behalf of [name], [name],as directed by  [employer], [name] while in the presence of [employer], [name].     Established patient visit   [name]   DOB: 12-Jan-1961   62 y.o. Female  MRN: [id number] Visit Date: 06/09/2022  Today's healthcare provider: [name], [name]   No chief complaint on file.  Subjective    HPI  Diabetes Mellitus Type II, Follow-up  Lab Results  Component Value Date   HGBA1C 7.4 (A) 09/02/2021   HGBA1C 11.8 (A) 05/06/2021   HGBA1C 8.7 (H) 05/05/2019   Wt Readings from Last 3 Encounters:  11/04/21 195 lb (88.5 kg)  09/02/21 203 lb (92.1 kg)  05/06/21 191 lb (86.6 kg)   Last seen for diabetes 9 months ago.  Management since then includes no changes. She reports {excellent/good/fair/poor:19665} compliance with treatment. She {is/is not:21021397} having side effects. {document side effects if present:1}   Home blood sugar records: {diabetes glucometry results:16657}  Episodes of hypoglycemia? {Yes/No:20286} {enter symptoms and frequency of symptoms if yes:1}   Current insulin regiment: Lantus 80 units BID Most Recent Eye Exam: ***  Pertinent Labs: Lab Results  Component Value Date   CHOL 176 05/27/2021   HDL 30 (L) 05/27/2021   LDLCALC 118 (H) 05/27/2021   TRIG 157 (H) 05/27/2021   CHOLHDL 5.9 (H) 05/27/2021   Lab Results  Component Value Date   NA 136 11/04/2021   K 4.5 11/04/2021   CREATININE 0.81 11/04/2021   GFRNONAA >60 11/04/2021   LABMICR 664.0 05/27/2021   MICRALBCREAT 547 (H) 05/27/2021     ---------------------------------------------------------------------------------------------------   Medications: Outpatient Medications Prior to Visit  Medication Sig   amphetamine-dextroamphetamine (ADDERALL) 20 MG tablet Take 20 mg by mouth daily.   Aspirin 81 MG CAPS Take 81 mg by mouth daily.  Reported on 07/30/2015   blood glucose meter kit and supplies KIT Dispense based on patient and insurance preference. Use to monitor blood sugars once daily as directed.   Blood Glucose Monitoring Suppl (ACCU-CHEK AVIVA PLUS) w/Device KIT Use to check blood sugar daily for type 2 diabetes. E11.9   cephALEXin (KEFLEX) 500 MG capsule Take 1 capsule (500 mg total) by mouth 2 (two) times daily.   dapagliflozin propanediol (FARXIGA) 10 MG TABS tablet Take 1 tablet (10 mg total) by mouth daily before breakfast. Patient receives via [place] Patient Assistance   gabapentin (NEURONTIN) 400 MG capsule Take 400 mg by mouth at bedtime as needed.   glucose blood (ACCU-CHEK AVIVA PLUS) test strip Use to check blood sugar once a day   insulin glargine (LANTUS SOLOSTAR) 100 UNIT/ML Solostar Pen INJECT 80 UNITS INTO THE SKIN 2 (TWO) TIMES DAILY. OFFICE VISIT NEEDED FOR ADDITIONAL REFILLS   Insulin Syringe 27G X 1/2" 1 ML MISC 1 Units by Does not apply route daily. For insulin dependent type 2 diabetes   methadone (DOLOPHINE) 10 MG tablet Limit 5-7 tabs by mouth per day if tolerated (Patient taking differently: Take 10 mg by mouth. Limit 5-7 tabs by mouth per day if tolerated)   NARCAN 4 MG/0.1ML LIQD nasal spray kit    oxyCODONE (OXY IR/ROXICODONE) 5 MG immediate release tablet Limit 4 - 6 tabs po / day for breakthrough pain while taking methadone if tolerated   No facility-administered medications prior to visit.    Review of Systems  {Labs  Heme  Chem  Endocrine  Serology  Results Review (optional):23779}   Objective    There were no vitals taken for this visit. {Show previous vital signs (optional):23777}  Physical Exam  ***  No results found for any visits on 06/09/22.  Assessment & Plan     ***  No follow-ups on file.      {provider attestation***:1}   [name], Hershal Coria  [facility] [phone] (phone) [phone] (fax)  [place]

## 2022-06-11 ENCOUNTER — Inpatient Hospital Stay
Admission: EM | Admit: 2022-06-11 | Discharge: 2022-06-15 | DRG: 177 | Disposition: A | Discharge status: Home / Self Care | Payer: Medicare Other | Attending: Family Medicine | Admitting: Family Medicine

## 2022-06-11 ENCOUNTER — Other Ambulatory Visit: Payer: Self-pay

## 2022-06-11 ENCOUNTER — Emergency Department: Payer: Medicare Other

## 2022-06-11 DIAGNOSIS — F419 Anxiety disorder, unspecified: Secondary | ICD-10-CM | POA: Diagnosis not present

## 2022-06-11 DIAGNOSIS — R809 Proteinuria, unspecified: Secondary | ICD-10-CM | POA: Diagnosis present

## 2022-06-11 DIAGNOSIS — Z811 Family history of alcohol abuse and dependence: Secondary | ICD-10-CM

## 2022-06-11 DIAGNOSIS — Z794 Long term (current) use of insulin: Secondary | ICD-10-CM

## 2022-06-11 DIAGNOSIS — J168 Pneumonia due to other specified infectious organisms: Secondary | ICD-10-CM | POA: Diagnosis not present

## 2022-06-11 DIAGNOSIS — J440 Chronic obstructive pulmonary disease with acute lower respiratory infection: Secondary | ICD-10-CM | POA: Diagnosis present

## 2022-06-11 DIAGNOSIS — Z720 Tobacco use: Secondary | ICD-10-CM | POA: Diagnosis not present

## 2022-06-11 DIAGNOSIS — R001 Bradycardia, unspecified: Secondary | ICD-10-CM | POA: Diagnosis not present

## 2022-06-11 DIAGNOSIS — E1129 Type 2 diabetes mellitus with other diabetic kidney complication: Secondary | ICD-10-CM | POA: Diagnosis not present

## 2022-06-11 DIAGNOSIS — Z8261 Family history of arthritis: Secondary | ICD-10-CM | POA: Diagnosis not present

## 2022-06-11 DIAGNOSIS — J439 Emphysema, unspecified: Secondary | ICD-10-CM | POA: Diagnosis present

## 2022-06-11 DIAGNOSIS — Z882 Allergy status to sulfonamides status: Secondary | ICD-10-CM

## 2022-06-11 DIAGNOSIS — R918 Other nonspecific abnormal finding of lung field: Secondary | ICD-10-CM | POA: Diagnosis not present

## 2022-06-11 DIAGNOSIS — I10 Essential (primary) hypertension: Secondary | ICD-10-CM | POA: Diagnosis present

## 2022-06-11 DIAGNOSIS — J9621 Acute and chronic respiratory failure with hypoxia: Secondary | ICD-10-CM | POA: Diagnosis not present

## 2022-06-11 DIAGNOSIS — R0902 Hypoxemia: Secondary | ICD-10-CM | POA: Diagnosis not present

## 2022-06-11 DIAGNOSIS — A419 Sepsis, unspecified organism: Secondary | ICD-10-CM | POA: Diagnosis present

## 2022-06-11 DIAGNOSIS — E11649 Type 2 diabetes mellitus with hypoglycemia without coma: Secondary | ICD-10-CM | POA: Diagnosis not present

## 2022-06-11 DIAGNOSIS — Z1152 Encounter for screening for COVID-19: Secondary | ICD-10-CM | POA: Diagnosis not present

## 2022-06-11 DIAGNOSIS — R6520 Severe sepsis without septic shock: Secondary | ICD-10-CM | POA: Diagnosis not present

## 2022-06-11 DIAGNOSIS — F988 Other specified behavioral and emotional disorders with onset usually occurring in childhood and adolescence: Secondary | ICD-10-CM | POA: Diagnosis not present

## 2022-06-11 DIAGNOSIS — Z888 Allergy status to other drugs, medicaments and biological substances status: Secondary | ICD-10-CM | POA: Diagnosis not present

## 2022-06-11 DIAGNOSIS — F17210 Nicotine dependence, cigarettes, uncomplicated: Secondary | ICD-10-CM | POA: Diagnosis present

## 2022-06-11 DIAGNOSIS — Z833 Family history of diabetes mellitus: Secondary | ICD-10-CM | POA: Diagnosis not present

## 2022-06-11 DIAGNOSIS — E1165 Type 2 diabetes mellitus with hyperglycemia: Secondary | ICD-10-CM | POA: Diagnosis present

## 2022-06-11 DIAGNOSIS — R519 Headache, unspecified: Secondary | ICD-10-CM | POA: Diagnosis not present

## 2022-06-11 DIAGNOSIS — Z79899 Other long term (current) drug therapy: Secondary | ICD-10-CM

## 2022-06-11 DIAGNOSIS — J9601 Acute respiratory failure with hypoxia: Secondary | ICD-10-CM | POA: Diagnosis present

## 2022-06-11 DIAGNOSIS — Z825 Family history of asthma and other chronic lower respiratory diseases: Secondary | ICD-10-CM | POA: Diagnosis not present

## 2022-06-11 DIAGNOSIS — R069 Unspecified abnormalities of breathing: Secondary | ICD-10-CM | POA: Diagnosis not present

## 2022-06-11 DIAGNOSIS — Z96641 Presence of right artificial hip joint: Secondary | ICD-10-CM | POA: Diagnosis not present

## 2022-06-11 DIAGNOSIS — Z7982 Long term (current) use of aspirin: Secondary | ICD-10-CM | POA: Diagnosis not present

## 2022-06-11 DIAGNOSIS — R410 Disorientation, unspecified: Secondary | ICD-10-CM | POA: Diagnosis not present

## 2022-06-11 DIAGNOSIS — G894 Chronic pain syndrome: Secondary | ICD-10-CM | POA: Diagnosis present

## 2022-06-11 DIAGNOSIS — J690 Pneumonitis due to inhalation of food and vomit: Secondary | ICD-10-CM | POA: Diagnosis present

## 2022-06-11 DIAGNOSIS — R509 Fever, unspecified: Secondary | ICD-10-CM | POA: Diagnosis not present

## 2022-06-11 DIAGNOSIS — R4182 Altered mental status, unspecified: Principal | ICD-10-CM

## 2022-06-11 DIAGNOSIS — J189 Pneumonia, unspecified organism: Secondary | ICD-10-CM

## 2022-06-11 DIAGNOSIS — Z818 Family history of other mental and behavioral disorders: Secondary | ICD-10-CM | POA: Diagnosis not present

## 2022-06-11 DIAGNOSIS — N390 Urinary tract infection, site not specified: Secondary | ICD-10-CM | POA: Diagnosis present

## 2022-06-11 DIAGNOSIS — J438 Other emphysema: Secondary | ICD-10-CM | POA: Diagnosis not present

## 2022-06-11 DIAGNOSIS — R109 Unspecified abdominal pain: Secondary | ICD-10-CM | POA: Diagnosis not present

## 2022-06-11 LAB — RESP PANEL BY RT-PCR (RSV, FLU A&B, COVID)  RVPGX2
Influenza A by PCR: NEGATIVE
Influenza B by PCR: NEGATIVE
Resp Syncytial Virus by PCR: NEGATIVE
SARS Coronavirus 2 by RT PCR: NEGATIVE

## 2022-06-11 LAB — APTT: aPTT: 25 seconds (ref 24–36)

## 2022-06-11 LAB — BLOOD GAS, VENOUS
Acid-Base Excess: 8 mmol/L — ABNORMAL HIGH (ref 0.0–2.0)
Bicarbonate: 37.4 mmol/L — ABNORMAL HIGH (ref 20.0–28.0)
O2 Saturation: 43.3 %
Patient temperature: 37
pCO2, Ven: 76 mmHg (ref 44–60)
pH, Ven: 7.3 (ref 7.25–7.43)
pO2, Ven: <31 mmHg — CL (ref 32–45)

## 2022-06-11 LAB — TROPONIN I (HIGH SENSITIVITY)
Troponin I (High Sensitivity): 51 ng/L — ABNORMAL HIGH (ref <18)
Troponin I (High Sensitivity): 69 ng/L — ABNORMAL HIGH (ref <18)

## 2022-06-11 LAB — CBC WITH DIFFERENTIAL/PLATELET
Abs Immature Granulocytes: 0.04 10*3/uL (ref 0.00–0.07)
Basophils Absolute: 0 10*3/uL (ref 0.0–0.1)
Basophils Relative: 0 %
Eosinophils Absolute: 0.2 10*3/uL (ref 0.0–0.5)
Eosinophils Relative: 2 %
HCT: 44.3 % (ref 36.0–46.0)
Hemoglobin: 14 g/dL (ref 12.0–15.0)
Immature Granulocytes: 0 %
Lymphocytes Relative: 27 %
Lymphs Abs: 2.9 10*3/uL (ref 0.7–4.0)
MCH: 27.2 pg (ref 26.0–34.0)
MCHC: 31.6 g/dL (ref 30.0–36.0)
MCV: 86 fL (ref 80.0–100.0)
Monocytes Absolute: 0.9 10*3/uL (ref 0.1–1.0)
Monocytes Relative: 9 %
Neutro Abs: 6.5 10*3/uL (ref 1.7–7.7)
Neutrophils Relative %: 62 %
Platelets: 182 10*3/uL (ref 150–400)
RBC: 5.15 MIL/uL — ABNORMAL HIGH (ref 3.87–5.11)
RDW: 14.5 % (ref 11.5–15.5)
WBC: 10.7 10*3/uL — ABNORMAL HIGH (ref 4.0–10.5)
nRBC: 0 % (ref 0.0–0.2)

## 2022-06-11 LAB — CBG MONITORING, ED
Glucose-Capillary: 101 mg/dL — ABNORMAL HIGH (ref 70–99)
Glucose-Capillary: 68 mg/dL — ABNORMAL LOW (ref 70–99)
Glucose-Capillary: 120 mg/dL — ABNORMAL HIGH (ref 70–99)

## 2022-06-11 LAB — URINALYSIS, COMPLETE (UACMP) WITH MICROSCOPIC
Bilirubin Urine: NEGATIVE
Glucose, UA: NEGATIVE mg/dL
Ketones, ur: NEGATIVE mg/dL
Nitrite: POSITIVE — AB
Protein, ur: 100 mg/dL — AB
Specific Gravity, Urine: 1.019 (ref 1.005–1.030)
Squamous Epithelial / HPF: NONE SEEN /HPF (ref 0–5)
pH: 5 (ref 5.0–8.0)

## 2022-06-11 LAB — BRAIN NATRIURETIC PEPTIDE: B Natriuretic Peptide: 100.3 pg/mL — ABNORMAL HIGH (ref 0.0–100.0)

## 2022-06-11 LAB — PROCALCITONIN: Procalcitonin: <0.1 ng/mL

## 2022-06-11 LAB — COMPREHENSIVE METABOLIC PANEL
ALT: 17 U/L (ref 0–44)
AST: 15 U/L (ref 15–41)
Albumin: 3.4 g/dL — ABNORMAL LOW (ref 3.5–5.0)
Alkaline Phosphatase: 65 U/L (ref 38–126)
Anion gap: 7 (ref 5–15)
BUN: 16 mg/dL (ref 8–23)
CO2: 31 mmol/L (ref 22–32)
Calcium: 8.2 mg/dL — ABNORMAL LOW (ref 8.9–10.3)
Chloride: 97 mmol/L — ABNORMAL LOW (ref 98–111)
Creatinine, Ser: 0.73 mg/dL (ref 0.44–1.00)
GFR, Estimated: >60 mL/min (ref >60)
Glucose, Bld: 101 mg/dL — ABNORMAL HIGH (ref 70–99)
Potassium: 4.5 mmol/L (ref 3.5–5.1)
Sodium: 135 mmol/L (ref 135–145)
Total Bilirubin: 0.5 mg/dL (ref 0.3–1.2)
Total Protein: 7.6 g/dL (ref 6.5–8.1)

## 2022-06-11 LAB — PROTIME-INR
INR: 1.1 (ref 0.8–1.2)
Prothrombin Time: 13.8 seconds (ref 11.4–15.2)

## 2022-06-11 LAB — LACTIC ACID, PLASMA
Lactic Acid, Venous: 1.6 mmol/L (ref 0.5–1.9)
Lactic Acid, Venous: 1.4 mmol/L (ref 0.5–1.9)

## 2022-06-11 LAB — LIPASE, BLOOD: Lipase: 24 U/L (ref 11–51)

## 2022-06-11 MED ORDER — PREDNISONE 20 MG PO TABS
40.0000 mg | ORAL_TABLET | Freq: Every day | ORAL | Status: AC
  Administered 2022-06-12: 40 mg via ORAL
  Filled 2022-06-11: qty 2

## 2022-06-11 MED ORDER — ACETAMINOPHEN 650 MG RE SUPP: 650.0000 mg | Freq: Four times a day (QID) | RECTAL | Status: DC | PRN

## 2022-06-11 MED ORDER — INSULIN ASPART 100 UNIT/ML IJ SOLN
0.0000 [IU] | Freq: Three times a day (TID) | INTRAMUSCULAR | Status: DC
  Administered 2022-06-12: 15 [IU] via SUBCUTANEOUS
  Administered 2022-06-12: 11 [IU] via SUBCUTANEOUS
  Administered 2022-06-12: 15 [IU] via SUBCUTANEOUS
  Administered 2022-06-13 – 2022-06-14 (×3): 4 [IU] via SUBCUTANEOUS
  Administered 2022-06-15: 3 [IU] via SUBCUTANEOUS
  Filled 2022-06-11 (×7): qty 1

## 2022-06-11 MED ORDER — NICOTINE 14 MG/24HR TD PT24
14.0000 mg | MEDICATED_PATCH | Freq: Every day | TRANSDERMAL | Status: DC
  Administered 2022-06-12 – 2022-06-15 (×4): 14 mg via TRANSDERMAL
  Filled 2022-06-11 (×4): qty 1

## 2022-06-11 MED ORDER — ACETAMINOPHEN 325 MG PO TABS: 650.0000 mg | ORAL_TABLET | Freq: Four times a day (QID) | ORAL | Status: DC | PRN

## 2022-06-11 MED ORDER — SODIUM CHLORIDE 0.45 % IV SOLN: INTRAVENOUS | Status: DC

## 2022-06-11 MED ORDER — DAPAGLIFLOZIN PROPANEDIOL 10 MG PO TABS: 10.0000 mg | ORAL_TABLET | Freq: Every day | ORAL | Status: DC

## 2022-06-11 MED ORDER — ASPIRIN 81 MG PO TBEC
81.0000 mg | DELAYED_RELEASE_TABLET | Freq: Every day | ORAL | Status: DC
  Administered 2022-06-12 – 2022-06-15 (×4): 81 mg via ORAL
  Filled 2022-06-11 (×4): qty 1

## 2022-06-11 MED ORDER — ENOXAPARIN SODIUM 60 MG/0.6ML IJ SOSY
0.5000 mg/kg | PREFILLED_SYRINGE | INTRAMUSCULAR | Status: DC
  Administered 2022-06-12 – 2022-06-15 (×4): 45 mg via SUBCUTANEOUS
  Filled 2022-06-11 (×4): qty 0.6

## 2022-06-11 MED ORDER — ACETAMINOPHEN 500 MG PO TABS
1000.0000 mg | ORAL_TABLET | Freq: Once | ORAL | Status: AC
  Administered 2022-06-11: 1000 mg via ORAL
  Filled 2022-06-11: qty 2

## 2022-06-11 MED ORDER — METHADONE HCL 5 MG PO TABS
10.0000 mg | ORAL_TABLET | Freq: Three times a day (TID) | ORAL | Status: DC
  Administered 2022-06-12 – 2022-06-15 (×10): 10 mg via ORAL
  Filled 2022-06-11 (×7): qty 2
  Filled 2022-06-11: qty 1
  Filled 2022-06-11: qty 2
  Filled 2022-06-11: qty 1

## 2022-06-11 MED ORDER — IPRATROPIUM-ALBUTEROL 0.5-2.5 (3) MG/3ML IN SOLN
3.0000 mL | Freq: Four times a day (QID) | RESPIRATORY_TRACT | Status: AC
  Administered 2022-06-11 – 2022-06-12 (×4): 3 mL via RESPIRATORY_TRACT
  Filled 2022-06-11 (×4): qty 3

## 2022-06-11 MED ORDER — GABAPENTIN 400 MG PO CAPS: 400.0000 mg | ORAL_CAPSULE | Freq: Every evening | ORAL | Status: DC | PRN

## 2022-06-11 MED ORDER — SENNA 8.6 MG PO TABS
1.0000 | ORAL_TABLET | Freq: Two times a day (BID) | ORAL | Status: DC
  Administered 2022-06-11 – 2022-06-15 (×8): 8.6 mg via ORAL
  Filled 2022-06-11 (×8): qty 1

## 2022-06-11 MED ORDER — AMPHETAMINE-DEXTROAMPHETAMINE 10 MG PO TABS
30.0000 mg | ORAL_TABLET | Freq: Two times a day (BID) | ORAL | Status: DC
  Administered 2022-06-12 – 2022-06-15 (×7): 30 mg via ORAL
  Filled 2022-06-11 (×7): qty 3

## 2022-06-11 MED ORDER — METHADONE HCL 10 MG PO TABS: 10.0000 mg | ORAL_TABLET | Freq: Four times a day (QID) | ORAL | Status: DC

## 2022-06-11 MED ORDER — IOHEXOL 350 MG/ML SOLN
100.0000 mL | Freq: Once | INTRAVENOUS | Status: AC | PRN
  Administered 2022-06-11: 100 mL via INTRAVENOUS

## 2022-06-11 MED ORDER — IPRATROPIUM-ALBUTEROL 0.5-2.5 (3) MG/3ML IN SOLN
3.0000 mL | Freq: Once | RESPIRATORY_TRACT | Status: AC
  Administered 2022-06-11: 3 mL via RESPIRATORY_TRACT
  Filled 2022-06-11: qty 3

## 2022-06-11 MED ORDER — METHYLPREDNISOLONE SODIUM SUCC 125 MG IJ SOLR
125.0000 mg | Freq: Once | INTRAMUSCULAR | Status: AC
  Administered 2022-06-11: 125 mg via INTRAVENOUS
  Filled 2022-06-11: qty 2

## 2022-06-11 MED ORDER — OXYCODONE HCL 5 MG PO TABS
10.0000 mg | ORAL_TABLET | ORAL | Status: DC
  Administered 2022-06-12 – 2022-06-15 (×20): 10 mg via ORAL
  Filled 2022-06-11 (×22): qty 2

## 2022-06-11 MED ORDER — METRONIDAZOLE 500 MG/100ML IV SOLN
500.0000 mg | Freq: Once | INTRAVENOUS | Status: AC
  Administered 2022-06-11: 500 mg via INTRAVENOUS
  Filled 2022-06-11: qty 100

## 2022-06-11 MED ORDER — SODIUM CHLORIDE 0.9 % IV SOLN
2.0000 g | Freq: Three times a day (TID) | INTRAVENOUS | Status: DC
  Administered 2022-06-11 – 2022-06-15 (×11): 2 g via INTRAVENOUS
  Filled 2022-06-11 (×3): qty 12.5
  Filled 2022-06-11 (×2): qty 2
  Filled 2022-06-11 (×7): qty 12.5

## 2022-06-11 MED ORDER — LACTATED RINGERS IV BOLUS
1000.0000 mL | Freq: Once | INTRAVENOUS | Status: AC
  Administered 2022-06-11: 1000 mL via INTRAVENOUS

## 2022-06-11 MED ORDER — INSULIN GLARGINE-YFGN 100 UNIT/ML ~~LOC~~ SOLN
80.0000 [IU] | Freq: Two times a day (BID) | SUBCUTANEOUS | Status: DC
  Administered 2022-06-12 – 2022-06-15 (×5): 80 [IU] via SUBCUTANEOUS
  Filled 2022-06-11 (×9): qty 0.8

## 2022-06-11 MED ORDER — VANCOMYCIN HCL IN DEXTROSE 1-5 GM/200ML-% IV SOLN
1000.0000 mg | Freq: Once | INTRAVENOUS | Status: AC
  Administered 2022-06-11: 1000 mg via INTRAVENOUS
  Filled 2022-06-11: qty 200

## 2022-06-11 MED ORDER — OXYCODONE HCL 10 MG PO TABS: 40.0000 mg | ORAL_TABLET | Freq: Every day | ORAL | Status: DC

## 2022-06-11 MED ORDER — SODIUM CHLORIDE 0.9 % IV SOLN
2.0000 g | Freq: Once | INTRAVENOUS | Status: AC
  Administered 2022-06-11: 2 g via INTRAVENOUS
  Filled 2022-06-11: qty 12.5

## 2022-06-11 MED ORDER — ALPRAZOLAM 0.5 MG PO TABS: 0.5000 mg | ORAL_TABLET | Freq: Three times a day (TID) | ORAL | Status: DC | PRN

## 2022-06-11 NOTE — Subjective & Objective (Signed)
Amy Meyers. A 62 y/o with medical history of chronic pain syndrome, DM2, HTN reports a two week h/o coughing productive of thick bitter tasting mucus but not SOB, she also reports dysuria for a week or more. On inquiry she has a h/o choking when she eats but denies any aspiration events over the past 48 hrs. Today her daughters noted she was somnolent and when aroused she was confused. EMS was activated: EMTs recorded a temperature of 103, and hypoxemia. She was treated in the field with oxygen and was transported to [facility]ED for further evaluation.

## 2022-06-11 NOTE — ED Triage Notes (Signed)
Pt coming from home via [place] [facility]. Per EMS daughter called EMS for pt showing signs of confusion and slow to respond, which is abnormal for her. Pt has hx of UTI about a week ago and has had a cough for a couple weeks.  EMS has given '975mg'$  tylenol and 574m of NS

## 2022-06-11 NOTE — Assessment & Plan Note (Signed)
Patient is under the care of a psychiatrist being treated with Adderall for ADD  Plan Continue home regimen

## 2022-06-11 NOTE — Assessment & Plan Note (Signed)
Last A1C 09/02/21 7.4%. Currently patient takes lantus 80 u BID. She stopped [place] for fear of hypoglycemia.  Plan A1C  Continue Lantus 80 u BID  Sliding scale coverage.

## 2022-06-11 NOTE — Assessment & Plan Note (Signed)
Patient with 40+ pack year smoking habit and continues to smoke. She has never been told she has COPD and takes no medication. CTA chest at admission reveals centrolobular emphysema rated as mild. Very likely a chronic bronchitis component. At presentation she has hypercarbic and hypoxemic with diffuse wheezing. She required BiPAP for several hours but at admission is on [place] oxygen. She did receive solumedrol 125 mg IV and a duoneb treatment  Plan Smoking cessation  Nicotine patch - smokes 10 cigarettes per day  Prednisone taper: 40 mg in AM then taper down per symptoms  Duonebs q6 x 4 doses and re-evaluate

## 2022-06-11 NOTE — ED Notes (Signed)
Pt taken to CT.

## 2022-06-11 NOTE — H&P (Signed)
History and Physical    Amy Meyers KDT:[id number] DOB: [birth year] DOA: 06/11/2022  DOS: the patient was seen and examined on 06/11/2022  PCP: Birdie Sons, MD   Patient coming from: Home  I have personally briefly reviewed patient's old medical records in [facility]  Amy Meyers. A 62 y/o with medical history of chronic pain syndrome, DM2, HTN reports a two week h/o coughing productive of thick bitter tasting mucus but not SOB, she also reports dysuria for a week or more. On inquiry she has a h/o choking when she eats but denies any aspiration events over the past 48 hrs. Today her daughters noted she was somnolent and when aroused she was confused. EMS was activated: EMTs recorded a temperature of 103, and hypoxemia. She was treated in the field with oxygen and was transported to [facility] for further evaluation.   ED Course: Tmax 101.8 to 98.3  108/60  HR 110 to 74  RR 11. No signicant findings EDP exam. Lab - VBG 7.3/76/<31 Cmet OK  WBC 10.7 with 62/27/9/2 eos, Hgb 14, U/A - many bacteria, WBC6-10. CTA chest - no PE, mild bronchial wall thickening, plaque LLL c/w sequellae of aspiration vs infection, no infiltrate, no pulmonary edema., mild centrolobular emphysema. CT Abd/Pelv no acute findings. Patient treated with duoneb x 1, solumedrol 125 mg IV for wheezing, BiPAP for hypercarbia x 2-3 hrs now on [place] O2, 1 L LR bolus (had 1L bolus in the field). Abx started Vanc/Cefepime/Flagyl. TRH called to admit for further evaluation and management of multiple co-morbidities  Review of Systems:  Review of Systems  Constitutional:  Positive for chills, fever and malaise/fatigue. Negative for weight loss.  HENT: Negative.    Eyes: Negative.   Respiratory:  Positive for cough, sputum production, shortness of breath and wheezing.   Cardiovascular:  Negative for chest pain, palpitations and leg swelling.  Gastrointestinal:  Positive for nausea.       Loss of appetite  Genitourinary:  Positive for  dysuria, frequency and urgency. Negative for flank pain.  Musculoskeletal: Negative.        Chronic pain both hips  Skin: Negative.   Neurological: Negative.   Endo/Heme/Allergies:  Does not bruise/bleed easily.  Psychiatric/Behavioral: Negative.      Past Medical History:  Diagnosis Date   Anxiety    Arthritis    Diabetes mellitus without complication ([place])    Diabetic foot ulcer ([place]) 04/06/2015   Hypertension    Kidney infection    Open wound of right foot    Staph infection    right foot    Past Surgical History:  Procedure Laterality Date   ABDOMINAL ADHESION SURGERY     CARPAL TUNNEL RELEASE Bilateral    [place]   x 2    CHOLECYSTECTOMY  05/30/1979   FOOT SURGERY Left    JOINT REPLACEMENT     right hip replacement   TONSILLECTOMY  05/29/1978   TONSILLECTOMY     TOTAL ABDOMINAL HYSTERECTOMY  05/29/1996   with oophorectomy due to adhesions   upper GI X ray series  06/04/2001   Hiatal Hernia    Soc Hx - married 65 years, widowed x 12 years. She has 3 daughters, 1 son -died in Feb 04, 2023, 9 grandchildren, 1 great-grand. Lives with Sons two children, daughter, grandson. She is on disability.    reports that she has been smoking cigarettes. She has a 44.00 pack-year smoking history. She has quit using smokeless  tobacco.  Her smokeless tobacco use included chew. She reports that she does not drink alcohol and does not use drugs.  Allergies  Allergen Reactions   Meloxicam Itching, Swelling and Nausea And Vomiting   Sulfa Antibiotics Other (See Comments)    Family History  Problem Relation Age of Onset   Alcohol abuse Mother    Arthritis Mother    COPD Mother    Depression Mother    Diabetes Mother    Alcohol abuse Father    Depression Father    Early death Father    Learning disabilities Father    Alcohol abuse Brother    Arthritis Brother    Diabetes Maternal Grandmother    Varicose Veins Paternal Grandmother    Diabetes Maternal  Grandfather    Alcohol abuse Paternal Grandfather     Prior to Admission medications   Medication Sig Start Date End Date Taking? Authorizing Provider  amphetamine-dextroamphetamine (ADDERALL) 30 MG tablet Take 1 tablet by mouth 2 (two) times daily. 06/06/22  Yes [provider]  Aspirin 81 MG CAPS Take 81 mg by mouth daily. Reported on 07/30/2015   Yes [provider]  diclofenac Sodium (VOLTAREN) 1 % GEL Apply 2-4 g topically 4 (four) times daily. 12/21/21  Yes [provider]  insulin glargine (LANTUS SOLOSTAR) 100 UNIT/ML Solostar Pen INJECT 80 UNITS INTO THE SKIN 2 (TWO) TIMES DAILY. OFFICE VISIT NEEDED FOR ADDITIONAL REFILLS 06/08/22  Yes Birdie Sons, MD  methadone (DOLOPHINE) 10 MG tablet Limit 5-7 tabs by mouth per day if tolerated Patient taking differently: Take 30 mg by mouth daily. Limit 5-7 tabs by mouth per day if tolerated 01/13/16  Yes Mohammed Kindle, MD  Oxycodone HCl 10 MG TABS Take 10 mg by mouth every 4 (four) hours. 06/02/22  Yes [provider]  amphetamine-dextroamphetamine (ADDERALL) 20 MG tablet Take 20 mg by mouth daily. Patient not taking: Reported on 06/11/2022 04/03/19   [provider]  blood glucose meter kit and supplies KIT Dispense based on patient and insurance preference. Use to monitor blood sugars once daily as directed. 10/04/21   Birdie Sons, MD  Blood Glucose Monitoring Suppl (ACCU-CHEK AVIVA PLUS) w/Device KIT Use to check blood sugar daily for type 2 diabetes. E11.9 10/12/21   Birdie Sons, MD  cephALEXin (KEFLEX) 500 MG capsule Take 1 capsule (500 mg total) by mouth 2 (two) times daily. Patient not taking: Reported on 06/11/2022 11/05/21   Ward, Delice Bison, DO  dapagliflozin propanediol (FARXIGA) 10 MG TABS tablet Take 1 tablet (10 mg total) by mouth daily before breakfast. Patient receives via [employer] Patient Assistance Patient not taking: Reported on 06/11/2022 10/06/21   Birdie Sons, MD  gabapentin  (NEURONTIN) 400 MG capsule Take 400 mg by mouth at bedtime as needed. 04/03/19   [provider]  glucose blood (ACCU-CHEK AVIVA PLUS) test strip Use to check blood sugar once a day 10/12/21   Birdie Sons, MD  Insulin Syringe 27G X 1/2" 1 ML MISC 1 Units by Does not apply route daily. For insulin dependent type 2 diabetes 05/06/19   Birdie Sons, MD  [facility] 4 MG/0.1ML LIQD nasal spray kit  02/18/19   [provider]  oxyCODONE (OXY IR/ROXICODONE) 5 MG immediate release tablet Limit 4 - 6 tabs po / day for breakthrough pain while taking methadone if tolerated Patient not taking: Reported on 06/11/2022 01/13/16   Mohammed Kindle, MD    Physical Exam: Vitals:   06/11/22  1925 06/11/22 1929 06/11/22 1930 06/11/22 2030  BP:   108/60 (!) 105/51  Pulse: 74 73 74 73  Resp: '11 11 11 15  '$ Temp:      TempSrc:      SpO2: 91% 94% 92% 94%  Weight:      Height:        Physical Exam Vitals and nursing note reviewed.  Constitutional:      General: She is not in acute distress.    Appearance: She is obese. She is not ill-appearing.  HENT:     Head: Normocephalic and atraumatic.     Mouth/Throat:     Mouth: Mucous membranes are dry.     Pharynx: No oropharyngeal exudate.     Comments: Very poor dentition, missing many teeth. Gums not assessed Eyes:     General: No scleral icterus.    Extraocular Movements: Extraocular movements intact.     Conjunctiva/sclera: Conjunctivae normal.     Pupils: Pupils are equal, round, and reactive to light.  Neck:     Vascular: No carotid bruit.  Cardiovascular:     Rate and Rhythm: Normal rate and regular rhythm.     Pulses: Normal pulses.     Heart sounds: Normal heart sounds.  Pulmonary:     Effort: Pulmonary effort is normal. No respiratory distress.     Breath sounds: No wheezing or rhonchi.     Comments: Decreased BS both bases. No wheezing (patient received IV solumedrol prior to exam) Abdominal:     General: Bowel sounds are  normal. There is no distension.     Palpations: Abdomen is soft.     Tenderness: There is no guarding or rebound.  Musculoskeletal:        General: Normal range of motion.     Cervical back: Normal range of motion and neck supple. No rigidity.     Right lower leg: No edema.     Left lower leg: No edema.  Lymphadenopathy:     Cervical: No cervical adenopathy.  Skin:    General: Skin is warm and dry.  Neurological:     General: No focal deficit present.     Mental Status: She is alert and oriented to person, place, and time.     Cranial Nerves: No cranial nerve deficit.     Sensory: No sensory deficit.     Comments: Nl sensation to light touch both feet   Psychiatric:        Mood and Affect: Mood normal.        Behavior: Behavior normal.      Labs on Admission: I have personally reviewed following labs and imaging studies  CBC: Recent Labs  Lab 06/11/22 1530  WBC 10.7*  NEUTROABS 6.5  HGB 14.0  HCT 44.3  MCV 86.0  PLT 673   Basic Metabolic Panel: Recent Labs  Lab 06/11/22 1530  NA 135  K 4.5  CL 97*  CO2 31  GLUCOSE 101*  BUN 16  CREATININE 0.73  CALCIUM 8.2*   GFR: Estimated Creatinine Clearance: 80.7 mL/min (by C-G formula based on SCr of 0.73 mg/dL). Liver Function Tests: Recent Labs  Lab 06/11/22 1530  AST 15  ALT 17  ALKPHOS 65  BILITOT 0.5  PROT 7.6  ALBUMIN 3.4*   Recent Labs  Lab 06/11/22 1530  LIPASE 24   No results for input(s): "AMMONIA" in the last 168 hours. Coagulation Profile: Recent Labs  Lab 06/11/22 1530  INR 1.1   Cardiac Enzymes:  No results for input(s): "CKTOTAL", "CKMB", "CKMBINDEX", "TROPONINI" in the last 168 hours. BNP (last 3 results) No results for input(s): "PROBNP" in the last 8760 hours. HbA1C: No results for input(s): "HGBA1C" in the last 72 hours. CBG: Recent Labs  Lab 06/11/22 1557 06/11/22 1934 06/11/22 2103  GLUCAP 101* 68* 120*   Lipid Profile: No results for input(s): "CHOL", "HDL",  "LDLCALC", "TRIG", "CHOLHDL", "LDLDIRECT" in the last 72 hours. Thyroid Function Tests: No results for input(s): "TSH", "T4TOTAL", "FREET4", "T3FREE", "THYROIDAB" in the last 72 hours. Anemia Panel: No results for input(s): "VITAMINB12", "FOLATE", "FERRITIN", "TIBC", "IRON", "RETICCTPCT" in the last 72 hours. Urine analysis:    Component Value Date/Time   COLORURINE YELLOW (A) 06/11/2022 1924   APPEARANCEUR HAZY (A) 06/11/2022 1924   APPEARANCEUR Clear 11/17/2013 1155   LABSPEC 1.019 06/11/2022 1924   LABSPEC 1.006 11/17/2013 1155   PHURINE 5.0 06/11/2022 1924   GLUCOSEU NEGATIVE 06/11/2022 1924   GLUCOSEU Negative 11/17/2013 1155   HGBUR SMALL (A) 06/11/2022 1924   BILIRUBINUR NEGATIVE 06/11/2022 1924   BILIRUBINUR Neg 09/28/2017 1207   BILIRUBINUR Negative 11/17/2013 1155   KETONESUR NEGATIVE 06/11/2022 1924   PROTEINUR 100 (A) 06/11/2022 1924   UROBILINOGEN 0.2 09/28/2017 1207   NITRITE POSITIVE (A) 06/11/2022 1924   LEUKOCYTESUR TRACE (A) 06/11/2022 1924   LEUKOCYTESUR Trace 11/17/2013 1155    Radiological Exams on Admission: I have personally reviewed images CT Angio Chest PE W and/or Wo Contrast  Result Date: 06/11/2022 CLINICAL DATA:  Pulmonary embolism (PE) suspected, high prob; Abdominal pain, acute, nonlocalized EXAM: CT ANGIOGRAPHY CHEST CT ABDOMEN AND PELVIS WITH CONTRAST TECHNIQUE: Multidetector CT imaging of the chest was performed using the standard protocol during bolus administration of intravenous contrast. Multiplanar CT image reconstructions and MIPs were obtained to evaluate the vascular anatomy. Multidetector CT imaging of the abdomen and pelvis was performed using the standard protocol during bolus administration of intravenous contrast. RADIATION DOSE REDUCTION: This exam was performed according to the departmental dose-optimization program which includes automated exposure control, adjustment of the mA and/or kV according to patient size and/or use of iterative  reconstruction technique. CONTRAST:  126m OMNIPAQUE IOHEXOL 350 MG/ML SOLN COMPARISON:  February 29, 2020. February 06, 2012 FINDINGS: CTA CHEST FINDINGS Cardiovascular: Satisfactory opacification of the pulmonary arteries to the segmental level. No evidence of pulmonary embolism. Cardiomegaly. No pericardial effusion. Three-vessel coronary artery atherosclerotic calcifications. Scattered atherosclerotic calcifications of the nonaneurysmal thoracic aorta. Mild enlargement of the pulmonary artery in relation to the ascending thoracic aorta. Mediastinum/Nodes: Visualized thyroid is unremarkable. No axillary adenopathy. Prominent pre aortic lymph node measuring 10 mm in the short axis (series 10, image 126). Lungs/Pleura: No pleural effusion or pneumothorax. Mild bronchial wall thickening with endobronchial debris predominately within the LEFT lower lobe. There are downstream scattered hypoenhancing platelike opacities within the LEFT lower lobe. Mild centrilobular emphysema. There are scattered pulmonary nodules with representative nodules as follows: Nodule 1: RIGHT upper lobe pulmonary nodule measures 2 mm (series 10, image 157). Nodule 2: LEFT upper lobe pulmonary nodule measures 4 mm (series 10, image 142). Musculoskeletal: No chest wall abnormality. No acute or significant osseous findings. Review of the MIP images confirms the above findings. CT ABDOMEN and PELVIS FINDINGS Hepatobiliary: Status post cholecystectomy. Mild dilation of the common bile duct to approximately 11 mm, likely due to post cholecystectomy state. Hepatomegaly. Pancreas: Unremarkable. No pancreatic ductal dilatation or surrounding inflammatory changes. Spleen: Normal in size without focal abnormality. Adrenals/Urinary Tract: 15 mm RIGHT adrenal adenoma, stable since at  least 2013 and consistent with a benign etiology (for which no dedicated imaging follow-up is recommended). LEFT adrenal gland is unremarkable. Kidneys enhance symmetrically.  No hydronephrosis. No obstructing nephrolithiasis. Bladder is unremarkable. Stomach/Bowel: No evidence of bowel obstruction. Moderate to large colonic stool burden predominately within the RIGHT hemicolon. Small hiatal hernia. No ancillary evidence of acute appendicitis. Vascular/Lymphatic: Atherosclerotic calcifications of the nonaneurysmal abdominal aorta. No new suspicious lymphadenopathy. Reproductive: Status post hysterectomy. No adnexal masses. Other: Small fat containing ventral hernia of the RIGHT abdomen. Musculoskeletal: Status post RIGHT hip arthroplasty. Advanced degenerative changes of the LEFT hip. Review of the MIP images confirms the above findings. IMPRESSION: 1. No evidence of pulmonary embolism. 2. Mild bronchial wall thickening with endobronchial debris predominately within the LEFT lower lobe. There are downstream scattered hypoenhancing platelike opacities within the LEFT lower lobe. Findings could reflect sequela of aspiration or infection. 3. Scattered pulmonary nodules measuring up to 4 mm. No follow-up needed if patient is low-risk (and has no known or suspected primary neoplasm). Non-contrast chest CT can be considered in 12 months if patient is high-risk. This recommendation follows the consensus statement: Guidelines for Management of Incidental Pulmonary Nodules Detected on CT Images: From the Fleischner Society 2017; Radiology 2017; 284:228-243. 4. Moderate to large colonic stool burden predominately within the RIGHT hemicolon. 5. Mild enlargement of the pulmonary artery in relation to the ascending thoracic aorta. This can be seen in the setting of pulmonary arterial hypertension. 6. Small hiatal hernia. Aortic Atherosclerosis (ICD10-I70.0) and Emphysema (ICD10-J43.9). Electronically Signed   By: Valentino Saxon M.D.   On: 06/11/2022 19:31   CT ABDOMEN PELVIS W CONTRAST  Result Date: 06/11/2022 CLINICAL DATA:  Pulmonary embolism (PE) suspected, high prob; Abdominal pain, acute,  nonlocalized EXAM: CT ANGIOGRAPHY CHEST CT ABDOMEN AND PELVIS WITH CONTRAST TECHNIQUE: Multidetector CT imaging of the chest was performed using the standard protocol during bolus administration of intravenous contrast. Multiplanar CT image reconstructions and MIPs were obtained to evaluate the vascular anatomy. Multidetector CT imaging of the abdomen and pelvis was performed using the standard protocol during bolus administration of intravenous contrast. RADIATION DOSE REDUCTION: This exam was performed according to the departmental dose-optimization program which includes automated exposure control, adjustment of the mA and/or kV according to patient size and/or use of iterative reconstruction technique. CONTRAST:  119m OMNIPAQUE IOHEXOL 350 MG/ML SOLN COMPARISON:  February 29, 2020. February 06, 2012 FINDINGS: CTA CHEST FINDINGS Cardiovascular: Satisfactory opacification of the pulmonary arteries to the segmental level. No evidence of pulmonary embolism. Cardiomegaly. No pericardial effusion. Three-vessel coronary artery atherosclerotic calcifications. Scattered atherosclerotic calcifications of the nonaneurysmal thoracic aorta. Mild enlargement of the pulmonary artery in relation to the ascending thoracic aorta. Mediastinum/Nodes: Visualized thyroid is unremarkable. No axillary adenopathy. Prominent pre aortic lymph node measuring 10 mm in the short axis (series 10, image 126). Lungs/Pleura: No pleural effusion or pneumothorax. Mild bronchial wall thickening with endobronchial debris predominately within the LEFT lower lobe. There are downstream scattered hypoenhancing platelike opacities within the LEFT lower lobe. Mild centrilobular emphysema. There are scattered pulmonary nodules with representative nodules as follows: Nodule 1: RIGHT upper lobe pulmonary nodule measures 2 mm (series 10, image 157). Nodule 2: LEFT upper lobe pulmonary nodule measures 4 mm (series 10, image 142). Musculoskeletal: No chest wall  abnormality. No acute or significant osseous findings. Review of the MIP images confirms the above findings. CT ABDOMEN and PELVIS FINDINGS Hepatobiliary: Status post cholecystectomy. Mild dilation of the common bile duct to approximately 11 mm, likely due  to post cholecystectomy state. Hepatomegaly. Pancreas: Unremarkable. No pancreatic ductal dilatation or surrounding inflammatory changes. Spleen: Normal in size without focal abnormality. Adrenals/Urinary Tract: 15 mm RIGHT adrenal adenoma, stable since at least 2013 and consistent with a benign etiology (for which no dedicated imaging follow-up is recommended). LEFT adrenal gland is unremarkable. Kidneys enhance symmetrically. No hydronephrosis. No obstructing nephrolithiasis. Bladder is unremarkable. Stomach/Bowel: No evidence of bowel obstruction. Moderate to large colonic stool burden predominately within the RIGHT hemicolon. Small hiatal hernia. No ancillary evidence of acute appendicitis. Vascular/Lymphatic: Atherosclerotic calcifications of the nonaneurysmal abdominal aorta. No new suspicious lymphadenopathy. Reproductive: Status post hysterectomy. No adnexal masses. Other: Small fat containing ventral hernia of the RIGHT abdomen. Musculoskeletal: Status post RIGHT hip arthroplasty. Advanced degenerative changes of the LEFT hip. Review of the MIP images confirms the above findings. IMPRESSION: 1. No evidence of pulmonary embolism. 2. Mild bronchial wall thickening with endobronchial debris predominately within the LEFT lower lobe. There are downstream scattered hypoenhancing platelike opacities within the LEFT lower lobe. Findings could reflect sequela of aspiration or infection. 3. Scattered pulmonary nodules measuring up to 4 mm. No follow-up needed if patient is low-risk (and has no known or suspected primary neoplasm). Non-contrast chest CT can be considered in 12 months if patient is high-risk. This recommendation follows the consensus statement:  Guidelines for Management of Incidental Pulmonary Nodules Detected on CT Images: From the Fleischner Society 2017; Radiology 2017; 284:228-243. 4. Moderate to large colonic stool burden predominately within the RIGHT hemicolon. 5. Mild enlargement of the pulmonary artery in relation to the ascending thoracic aorta. This can be seen in the setting of pulmonary arterial hypertension. 6. Small hiatal hernia. Aortic Atherosclerosis (ICD10-I70.0) and Emphysema (ICD10-J43.9). Electronically Signed   By: Valentino Saxon M.D.   On: 06/11/2022 19:31   CT HEAD WO CONTRAST (5MM)  Result Date: 06/11/2022 CLINICAL DATA:  Headache, fever, confusion, urinary tract infection EXAM: CT HEAD WITHOUT CONTRAST TECHNIQUE: Contiguous axial images were obtained from the base of the skull through the vertex without intravenous contrast. RADIATION DOSE REDUCTION: This exam was performed according to the departmental dose-optimization program which includes automated exposure control, adjustment of the mA and/or kV according to patient size and/or use of iterative reconstruction technique. COMPARISON:  06/21/2017 FINDINGS: Brain: Hypodensities within the periventricular white matter, bilateral basal ganglia, and right cerebellar hemisphere are consistent with chronic small vessel ischemic changes. No acute infarct or hemorrhage. Lateral ventricles and remaining midline structures are unremarkable. No acute extra-axial fluid collections. No mass effect. Vascular: No hyperdense vessel or unexpected calcification. Skull: Normal. Negative for fracture or focal lesion. Sinuses/Orbits: No acute finding. Other: None. IMPRESSION: 1. No acute intracranial process. Chronic small vessel ischemic changes as above. Electronically Signed   By: Randa Ngo M.D.   On: 06/11/2022 19:16   DG Chest Port 1 View  Result Date: 06/11/2022 CLINICAL DATA:  Questionable sepsis - evaluate for abnormality EXAM: PORTABLE CHEST 1 VIEW COMPARISON:  November 05, 2021 FINDINGS: The cardiomediastinal silhouette is unchanged in contour. No pleural effusion. No pneumothorax. Increased reticulonodular markings of bilateral bases with prominent bronchial wall markings. IMPRESSION: Increased reticulonodular markings of bilateral bases. Differential considerations include pulmonary edema versus atypical infection. Electronically Signed   By: Valentino Saxon M.D.   On: 06/11/2022 15:48    EKG: I have personally reviewed EKG: Sinus tachycardia, no acute changes  Assessment/Plan Principal Problem:   Sepsis ([place]) Active Problems:   Current tobacco use   Type 2 diabetes mellitus with microalbuminuria, with  long-term current use of insulin (HCC)   Chronic pain syndrome   Emphysema lung (HCC)   Hypertension   Anxiety   ADD (attention deficit disorder) without hyperactivity    Assessment and Plan: * Sepsis ([place]) Patient presented with T 101.8, HR 108, hypoxemia and hypercarbia. EDP initiated code sepsis. Origin of infection unclear: CTA w/o infiltrate but reveals plaquing suggested to be sequellae of aspiration or infection. No PE. Emphysema present. CT abd/pelvis w/o evidence of infection. U/A with small LE, micro reveals many bacteria but no pyuria. Lactic acid 1.6 to 1.4. WBC 10.7 with 62/27/9/2 eos.Patient resuscitated with 1 L LR and Abx started: VAnc/Cefepime/Flagyl.  Plan Medical tele admit  Procalcitonin  Narrow Abx to cefepime  Blood Cx pending.   F/u CBCD in AM  Emphysema lung [facility]) Patient with 40+ pack year smoking habit and continues to smoke. She has never been told she has COPD and takes no medication. CTA chest at admission reveals centrolobular emphysema rated as mild. Very likely a chronic bronchitis component. At presentation she has hypercarbic and hypoxemic with diffuse wheezing. She required BiPAP for several hours but at admission is on [place] oxygen. She did receive solumedrol 125 mg IV and a duoneb treatment  Plan Smoking  cessation  Nicotine patch - smokes 10 cigarettes per day  Prednisone taper: 40 mg in AM then taper down per symptoms  Duonebs q6 x 4 doses and re-evaluate  Chronic pain syndrome Patient reports a 23 year h/o chronic pain starting when she had THR right. She has been followed in a pain clinic by Dr. Primus Bravo until his retirement and she now has a new doctor. She was taking methadone up to 70 mg daily. Under her new clinician she has had methadone reduced to '10mg'$  TID and has started oxycodone '10mg'$  q 4 hrs.  Plan Continue home regimen.   Type 2 diabetes mellitus with microalbuminuria, with long-term current use of insulin (HCC) Last A1C 09/02/21 7.4%. Currently patient takes lantus 80 u BID. She stopped [place] for fear of hypoglycemia.  Plan A1C  Continue Lantus 80 u BID  Sliding scale coverage.   Current tobacco use LOng term smoker: > 40 pk/yrs.  Plan Smoking cessation counseling  Nicotine patch  ADD (attention deficit disorder) without hyperactivity Patient is under the care of a psychiatrist being treated with Adderall for ADD  Plan Continue home regimen  Anxiety Patient appears calm and rational at exam. She does not take anxiolytics at home.  Plan Xanax 0.5 mg q 6 prn anxiety  Hypertension BP low normal at presentation. Patient takes no medication at home and denies having this diagnosis  Plan Routine vitals  Medication recommendations to be based on BP readings       DVT prophylaxis: Lovenox Code Status: Full Code Family Communication: daughter present during evaluation  Disposition Plan: home when medically stable  Consults called: none  Admission status: Inpatient, Telemetry bed   Adella Hare, MD [facility] 06/11/2022, 9:57 PM

## 2022-06-11 NOTE — ED Notes (Signed)
Poc cbg performed. Orange juice provided. Patient tolerated well. Patient requesting tylenol for headache.

## 2022-06-11 NOTE — ED Provider Notes (Signed)
[facility] Provider Note    Event Date/Time   First MD Initiated Contact with Patient 06/11/22 1525     (approximate)   History   Altered Mental Status and Blood Infection   HPI  Amy Meyers is a 62 y.o. female with hypertension, diabetes, tobacco use not currently on oxygen who comes in with concerns for not acting her normal self.  Patient reports not feeling normal for the past few weeks.  She reports increasing shortness of breath.  She denies any falls or hitting her head.  She is on chronic methadone and oxycodone but denies taking any additional pills.  She denies any abdominal pain.  She denies any chest pain.  She does report that she does currently smoke.  EMS found patient to be hypoxic to 84% placed on 4 L as well as febrile to 103 given 1 g of Tylenol.  Patient has received 1 L of fluid with EMS.  There was some concern that patient was more sleepy per the family.  Patient was reportedly started on a course of antibiotics for UTI recently but did not finish the course.   Physical Exam   Triage Vital Signs: Blood pressure 120/65, pulse (!) 104, temperature (!) 101.8 F (38.8 C), temperature source Oral, resp. rate 13, height '5\' 6"'$  (1.676 m), weight 88.5 kg, SpO2 99 %.  Most recent vital signs: Vitals:   06/11/22 1925 06/11/22 1929  BP:    Pulse: 74 73  Resp: 11 11  Temp:    SpO2: 91% 94%     General: Awake, no distress.  CV:  Good peripheral perfusion.  Resp:  Normal effort.  Wheezing noted bilaterally. Abd:  No distention.  Other:  No trauma of the head.  Able to move her neck.  Denies any neck stiffness.  Abdomen is soft nontender.  Able to lift both legs up off the bed.  Able to give equal grip strength.  Alert and oriented x 3.   ED Results / Procedures / Treatments   Labs (all labs ordered are listed, but only abnormal results are displayed) Labs Reviewed  RESP PANEL BY RT-PCR (RSV, FLU A&B, COVID)  [id number]  CULTURE, BLOOD  (ROUTINE X 2)  CULTURE, BLOOD (ROUTINE X 2)  URINE CULTURE  LACTIC ACID, PLASMA  LACTIC ACID, PLASMA  COMPREHENSIVE METABOLIC PANEL  CBC WITH DIFFERENTIAL/PLATELET  PROTIME-INR  APTT  URINALYSIS, COMPLETE (UACMP) WITH MICROSCOPIC  BLOOD GAS, VENOUS  BRAIN NATRIURETIC PEPTIDE  LIPASE, BLOOD  TROPONIN I (HIGH SENSITIVITY)     EKG  My interpretation of EKG:  Sinus tachycardia rate of 109 without any ST elevation or T wave inversions, RBBB  RADIOLOGY I have reviewed the xray personally and interpreted and there is a little bit of haziness in the bottom of her lungs.   PROCEDURES:  Critical Care performed: Yes, see critical care procedure note(s)  .Critical Care  Performed by: Vanessa Mecca, MD Authorized by: Vanessa [place]okston, MD   Critical care provider statement:    Critical care time (minutes):  30   Critical care was necessary to treat or prevent imminent or life-threatening deterioration of the following conditions:  Sepsis   Critical care was time spent personally by me on the following activities:  Development of treatment plan with patient or surrogate, discussions with consultants, evaluation of patient's response to treatment, examination of patient, ordering and review of laboratory studies, ordering and review of radiographic studies, ordering and performing treatments and  interventions, pulse oximetry, re-evaluation of patient's condition and review of old charts .1-3 Lead EKG Interpretation  Performed by: Vanessa [place]wood-[place], MD Authorized by: Vanessa Pascola, MD     Interpretation: abnormal     ECG rate:  110   ECG rate assessment: tachycardic     Rhythm: sinus tachycardia     Ectopy: none     Conduction: normal      MEDICATIONS ORDERED IN ED: Medications  ceFEPIme (MAXIPIME) 2 g in sodium chloride 0.9 % 100 mL IVPB (has no administration in time range)  metroNIDAZOLE (FLAGYL) IVPB 500 mg (has no administration in time range)  vancomycin (VANCOCIN) IVPB 1000  mg/200 mL premix (has no administration in time range)  lactated ringers bolus 1,000 mL (has no administration in time range)  ipratropium-albuterol (DUONEB) 0.5-2.5 (3) MG/3ML nebulizer solution 3 mL (has no administration in time range)  methylPREDNISolone sodium succinate (SOLU-MEDROL) 125 mg/2 mL injection 125 mg (has no administration in time range)     IMPRESSION / MDM / ASSESSMENT AND PLAN / ED COURSE  I reviewed the triage vital signs and the nursing notes.   Patient's presentation is most consistent with acute presentation with potential threat to life or bodily function.   Patient comes in the meeting sepsis criteria with fever, tachycardia hypoxia.  Will start patient broad-spectrum antibiotics give some fluids patient received 1 L with EMS.  Sepsis workup was initiated.  Patient is currently alert and oriented x 4 and following all commands.  Differential COVID, flu, UTI, pneumonia  COVID, flu are negative.  CMP is reassuring CBC shows slightly elevated white count.  VBG shows elevated CO2 with normal pH and elevated bicarb most likely chronic but given some increased sleepiness will trial patient on BiPAP.  Given unclear source of patient not the best historian will get pan CT scan to evaluate for other causes of patient's confusion.  CT scans show concern for possible pneumonia versus aspiration and her urine looks concerning for possible UTI.  Patient got antibiotics for both.  She is much more alert currently off BiPAP she has been awake for the past 30 minutes not falling asleep leg earlier.  Her sugar was a little bit low and she is getting some orange juice I think she can eat shortly as long as her mental status remains well.  Discussed possible team for admission.    The patient is on the cardiac monitor to evaluate for evidence of arrhythmia and/or significant heart rate changes.      FINAL CLINICAL IMPRESSION(S) / ED DIAGNOSES   Final diagnoses:  Altered mental  status, unspecified altered mental status type  Pneumonia due to infectious organism, unspecified laterality, unspecified part of lung  Sepsis, due to unspecified organism, unspecified whether acute organ dysfunction present [facility])  Urinary tract infection without hematuria, site unspecified  Acute on chronic respiratory failure with hypoxia and hypercapnia ([place])     Rx / DC Orders   ED Discharge Orders     None        Note:  This document was prepared using Dragon voice recognition software and may include unintentional dictation errors.   Vanessa Kunkle, MD 06/11/22 1946

## 2022-06-11 NOTE — Assessment & Plan Note (Signed)
BP low normal at presentation. Patient takes no medication at home and denies having this diagnosis  Plan Routine vitals  Medication recommendations to be based on BP readings

## 2022-06-11 NOTE — Progress Notes (Signed)
PHARMACIST - PHYSICIAN COMMUNICATION  CONCERNING:  Enoxaparin (Lovenox) for DVT Prophylaxis    RECOMMENDATION: Patient was prescribed enoxaprin '40mg'$  q24 hours for VTE prophylaxis.   Filed Weights   06/11/22 1530  Weight: 88.5 kg (195 lb 1.7 oz)    Body mass index is 31.49 kg/m.  Estimated Creatinine Clearance: 80.7 mL/min (by C-G formula based on SCr of 0.73 mg/dL).   Based on [place] patient is candidate for enoxaparin 0.'5mg'$ /kg TBW SQ every 24 hours based on BMI being >30.  DESCRIPTION: Pharmacy has adjusted enoxaparin dose per [facility] policy.  Patient is now receiving enoxaparin 0.5 mg/kg every 24 hours   Renda Rolls, PharmD, [facility] 06/11/2022 9:50 PM

## 2022-06-11 NOTE — ED Notes (Signed)
Patient is resting comfortably. 

## 2022-06-11 NOTE — ED Notes (Signed)
CBG 101 

## 2022-06-11 NOTE — Assessment & Plan Note (Signed)
Patient appears calm and rational at exam. She does not take anxiolytics at home.  Plan Xanax 0.5 mg q 6 prn anxiety

## 2022-06-11 NOTE — ED Notes (Signed)
Care taken over at this time. Patient placed on purewick and clean brief per request of patient needing to urinate. Family at bedside. Patient placed in clean gown and on 2 L [place] at this time. Patient denies any other needs or requests at this time

## 2022-06-11 NOTE — Consult Note (Signed)
PHARMACY -  BRIEF ANTIBIOTIC NOTE   Pharmacy has received consult(s) for vancomycin and cefepime from an ED provider.  The patient's profile has been reviewed for ht/wt/allergies/indication/available labs.    One time order(s) placed for cefepime 2 g and vancomycin 1 g (no weight entered)  Further antibiotics/pharmacy consults should be ordered by admitting physician if indicated.                       Thank you, Vick Frees, PharmD 06/11/2022  3:29 PM

## 2022-06-11 NOTE — Assessment & Plan Note (Signed)
Patient reports a 23 year h/o chronic pain starting when she had THR right. She has been followed in a pain clinic by Dr. Primus Bravo until his retirement and she now has a new doctor. She was taking methadone up to 70 mg daily. Under her new clinician she has had methadone reduced to '10mg'$  TID and has started oxycodone '10mg'$  q 4 hrs.  Plan Continue home regimen.

## 2022-06-11 NOTE — Assessment & Plan Note (Signed)
LOng term smoker: > 40 pk/yrs.  Plan Smoking cessation counseling  Nicotine patch

## 2022-06-11 NOTE — Assessment & Plan Note (Signed)
Patient presented with T 101.8, HR 108, hypoxemia and hypercarbia. EDP initiated code sepsis. Origin of infection unclear: CTA w/o infiltrate but reveals plaquing suggested to be sequellae of aspiration or infection. No PE. Emphysema present. CT abd/pelvis w/o evidence of infection. U/A with small LE, micro reveals many bacteria but no pyuria. Lactic acid 1.6 to 1.4. WBC 10.7 with 62/27/9/2 eos.Patient resuscitated with 1 L LR and Abx started: VAnc/Cefepime/Flagyl.  Plan Medical tele admit  Procalcitonin  Narrow Abx to cefepime  Blood Cx pending.   F/u CBCD in AM

## 2022-06-11 NOTE — Sepsis Progress Note (Signed)
Elink following code sepsis

## 2022-06-11 NOTE — ED Notes (Signed)
Pt daughter at bedside stating "We gave her 100 units before she left."

## 2022-06-11 NOTE — Consult Note (Signed)
CODE SEPSIS - PHARMACY COMMUNICATION  **Broad Spectrum Antibiotics should be administered within 1 hour of Sepsis diagnosis**  Time Code Sepsis Called/Page Received: 1526  Antibiotics Ordered: 1526  Time of 1st antibiotic administration: 1553  Additional action taken by pharmacy: n/a    If necessary, Name of Provider/Nurse Contacted: [place] ,PharmD Clinical Pharmacist  06/11/2022  3:27 PM

## 2022-06-12 ENCOUNTER — Encounter: Payer: Self-pay | Admitting: Internal Medicine

## 2022-06-12 DIAGNOSIS — F988 Other specified behavioral and emotional disorders with onset usually occurring in childhood and adolescence: Secondary | ICD-10-CM

## 2022-06-12 DIAGNOSIS — E1129 Type 2 diabetes mellitus with other diabetic kidney complication: Secondary | ICD-10-CM

## 2022-06-12 DIAGNOSIS — J9601 Acute respiratory failure with hypoxia: Secondary | ICD-10-CM

## 2022-06-12 DIAGNOSIS — I10 Essential (primary) hypertension: Secondary | ICD-10-CM

## 2022-06-12 DIAGNOSIS — F419 Anxiety disorder, unspecified: Secondary | ICD-10-CM

## 2022-06-12 DIAGNOSIS — J438 Other emphysema: Secondary | ICD-10-CM

## 2022-06-12 DIAGNOSIS — G894 Chronic pain syndrome: Secondary | ICD-10-CM

## 2022-06-12 DIAGNOSIS — Z720 Tobacco use: Secondary | ICD-10-CM

## 2022-06-12 DIAGNOSIS — R809 Proteinuria, unspecified: Secondary | ICD-10-CM

## 2022-06-12 DIAGNOSIS — A419 Sepsis, unspecified organism: Secondary | ICD-10-CM | POA: Diagnosis not present

## 2022-06-12 DIAGNOSIS — R6520 Severe sepsis without septic shock: Secondary | ICD-10-CM

## 2022-06-12 DIAGNOSIS — Z794 Long term (current) use of insulin: Secondary | ICD-10-CM

## 2022-06-12 LAB — CBC WITH DIFFERENTIAL/PLATELET
Abs Immature Granulocytes: 0.1 10*3/uL — ABNORMAL HIGH (ref 0.00–0.07)
Basophils Absolute: 0 10*3/uL (ref 0.0–0.1)
Basophils Relative: 0 %
Eosinophils Absolute: 0 10*3/uL (ref 0.0–0.5)
Eosinophils Relative: 0 %
HCT: 38.6 % (ref 36.0–46.0)
Hemoglobin: 12.4 g/dL (ref 12.0–15.0)
Immature Granulocytes: 1 %
Lymphocytes Relative: 13 %
Lymphs Abs: 1.2 10*3/uL (ref 0.7–4.0)
MCH: 27.1 pg (ref 26.0–34.0)
MCHC: 32.1 g/dL (ref 30.0–36.0)
MCV: 84.3 fL (ref 80.0–100.0)
Monocytes Absolute: 0.4 10*3/uL (ref 0.1–1.0)
Monocytes Relative: 4 %
Neutro Abs: 7.2 10*3/uL (ref 1.7–7.7)
Neutrophils Relative %: 82 %
Platelets: 165 10*3/uL (ref 150–400)
RBC: 4.58 MIL/uL (ref 3.87–5.11)
RDW: 14.4 % (ref 11.5–15.5)
WBC: 8.9 10*3/uL (ref 4.0–10.5)
nRBC: 0 % (ref 0.0–0.2)

## 2022-06-12 LAB — GLUCOSE, CAPILLARY
Glucose-Capillary: 333 mg/dL — ABNORMAL HIGH (ref 70–99)
Glucose-Capillary: 221 mg/dL — ABNORMAL HIGH (ref 70–99)

## 2022-06-12 LAB — URINE CULTURE: Culture: NO GROWTH

## 2022-06-12 LAB — BASIC METABOLIC PANEL
Anion gap: 8 (ref 5–15)
BUN: 19 mg/dL (ref 8–23)
CO2: 27 mmol/L (ref 22–32)
Calcium: 8 mg/dL — ABNORMAL LOW (ref 8.9–10.3)
Chloride: 98 mmol/L (ref 98–111)
Creatinine, Ser: 0.88 mg/dL (ref 0.44–1.00)
GFR, Estimated: >60 mL/min (ref >60)
Glucose, Bld: 378 mg/dL — ABNORMAL HIGH (ref 70–99)
Potassium: 4.2 mmol/L (ref 3.5–5.1)
Sodium: 133 mmol/L — ABNORMAL LOW (ref 135–145)

## 2022-06-12 LAB — CBG MONITORING, ED
Glucose-Capillary: 276 mg/dL — ABNORMAL HIGH (ref 70–99)
Glucose-Capillary: 323 mg/dL — ABNORMAL HIGH (ref 70–99)

## 2022-06-12 LAB — HIV ANTIBODY (ROUTINE TESTING W REFLEX): HIV Screen 4th Generation wRfx: NONREACTIVE

## 2022-06-12 NOTE — ED Notes (Signed)
Patient's family brought patient food from [employer]. Patient noted to be drinking large frappe, french fries, nuts, cheesecake, and powdered donuts. Patient and family educated multiple times on healthy diabetic diet. Patient non compliant with diet orders at this time.

## 2022-06-12 NOTE — Progress Notes (Signed)
PROGRESS NOTE    CERA RORKE   PRF:[id number] DOB: 01/11/1961  DOA: 06/11/2022 Date of Service: 06/12/22 PCP: Birdie Sons, MD     Brief Narrative / Hospital Course:  Ms. Whitehair. A 62 y/o with medical history of chronic pain syndrome, DM2, HTN reports a two week h/o coughing productive of thick bitter tasting mucus but not SOB, she also reports dysuria for a week or more. On inquiry she has a h/o choking when she eats but denies any aspiration events over the past 48 hrs. Today her daughters noted she was somnolent and when aroused she was confused. EMS was activated: EMTs recorded a temperature of 103, and hypoxemia. She was treated in the field with oxygen and was transported to [facility] for further evaluation.   01/14: Tmax 101.8 to 98.3 108/60 HR 110 to 74 RR 11. No signicant findings EDP exam. Lab - VBG 7.3/76/<31 Cmet OK WBC 10.7 with 62/27/9/2 eos, Hgb 14, U/A - many bacteria, WBC6-10. CTA chest - no PE, mild bronchial wall thickening, plaque LLL c/w sequellae of aspiration vs infection, no infiltrate, no pulmonary edema., mild centrolobular emphysema. CT Abd/Pelv no acute findings. Patient treated with duoneb x 1, solumedrol 125 mg IV for wheezing, BiPAP for hypercarbia x 2-3 hrs now on [place] O2, 1 L LR bolus (had 1L bolus in the field). Abx started Vanc/Cefepime/Flagyl.  --> admitted, concern aspirationm COPD, possible UTI. Narrow abx to cefepime, started steroids, nebs. Procal <0.10, lactate WNL,  01/15: WBC WNL, VSS improving, await cultures, continue abx as below  Consultants:  none  Procedures: none      ASSESSMENT & PLAN:   Principal Problem:   Sepsis ([place]) Active Problems:   Current tobacco use   Type 2 diabetes mellitus with microalbuminuria, with long-term current use of insulin (HCC)   Chronic pain syndrome   Emphysema lung (HCC)   Hypertension   Anxiety   ADD (attention deficit disorder) without hyperactivity  Sepsis ([place]) likely d/t UTI Medical tele  admit Cefepime Blood and urine Cx pending.   Anxiety Does not take anxiolytics at home. Xanax 0.5 mg q 6 prn anxiety  Chronic pain syndrome Patient reports a 23 year h/o chronic pain starting when she had THR right. She has been followed in a pain clinic by Dr. Primus Bravo until his retirement and she now has a new doctor. She was taking methadone up to 70 mg daily. Under her new clinician she has had methadone reduced to '10mg'$  TID and has started oxycodone '10mg'$  q 4 hrs. Continue home regimen.   ADD (attention deficit disorder) without hyperactivity Continue home regimen  Emphysema lung (HCC) Acute hypoxic respiratory failure  Current tobacco use Smoking cessation, Nicotine patch - smokes 10 cigarettes per day Prednisone taper: 40 mg in AM then taper down per symptoms Duonebs q6 x 4 doses and re-evaluate Supplemental O2 as needed   Hypertension Patient takes no medication at home and denies having this diagnosis Routine vitals Medication recommendations to be based on BP readings  Type 2 diabetes mellitus with microalbuminuria, with long-term current use of insulin (HCC) A1C Continue Lantus 80 u BID Sliding scale coverage.     DVT prophylaxis: lovenox  Pertinent IV fluids/nutrition: IV fluids d/c Central lines / invasive devices: none  Code Status: FULL CODE   Current Admission Status: inpatient   TOC needs / Dispo plan: none Barriers to discharge / significant pending items: await cultures  Subjective / Brief ROS:  Patient reports feeling better today, still tired  Denies CP/SOB.  Pain controlled.  Denies new weakness.  Tolerating diet.  Reports no concerns w/ urination/defecation.   Family Communication: daughter at bedside on rounds     Objective Findings:  Vitals:   06/12/22 1000 06/12/22 1200 06/12/22 1249 06/12/22 1300  BP: (!) 146/72 (!) 148/69  (!) 144/62  Pulse: 89 89  88  Resp: '13 10  10  '$ Temp:   98.6 F (37 C)   TempSrc:       SpO2: 99% 96%  94%  Weight:      Height:        Intake/Output Summary (Last 24 hours) at 06/12/2022 1350 Last data filed at 06/12/2022 5732 Gross per 24 hour  Intake 1175.8 ml  Output --  Net 1175.8 ml   Filed Weights   06/11/22 1530  Weight: 88.5 kg    Examination:  Physical Exam Constitutional:      General: She is not in acute distress.    Appearance: She is obese. She is not ill-appearing.  Cardiovascular:     Rate and Rhythm: Normal rate and regular rhythm.     Heart sounds: Normal heart sounds.  Pulmonary:     Effort: Pulmonary effort is normal. No accessory muscle usage or respiratory distress.     Breath sounds: Decreased air movement present.  Neurological:     Mental Status: She is alert.          Scheduled Medications:   amphetamine-dextroamphetamine  30 mg Oral BID   aspirin EC  81 mg Oral Daily   enoxaparin (LOVENOX) injection  0.5 mg/kg Subcutaneous Q24H   insulin aspart  0-20 Units Subcutaneous TID WC   insulin glargine-yfgn  80 Units Subcutaneous BID   methadone  10 mg Oral Q8H   nicotine  14 mg Transdermal Daily   oxyCODONE  10 mg Oral Q4H   senna  1 tablet Oral BID    Continuous Infusions:  ceFEPime (MAXIPIME) IV Stopped (06/12/22 0827)    PRN Medications:  acetaminophen **OR** acetaminophen, ALPRAZolam  Antimicrobials from admission:  Anti-infectives (From admission, onward)    Start     Dose/Rate Route Frequency Ordered Stop   06/12/22 0000  ceFEPIme (MAXIPIME) 2 g in sodium chloride 0.9 % 100 mL IVPB        2 g 200 mL/hr over 30 Minutes Intravenous Every 8 hours 06/11/22 2213     06/11/22 1530  ceFEPIme (MAXIPIME) 2 g in sodium chloride 0.9 % 100 mL IVPB        2 g 200 mL/hr over 30 Minutes Intravenous  Once 06/11/22 1526 06/11/22 1644   06/11/22 1530  metroNIDAZOLE (FLAGYL) IVPB 500 mg        500 mg 100 mL/hr over 60 Minutes Intravenous  Once 06/11/22 1526 06/11/22 1644   06/11/22 1530  vancomycin (VANCOCIN) IVPB 1000 mg/200  mL premix        1,000 mg 200 mL/hr over 60 Minutes Intravenous  Once 06/11/22 1526 06/11/22 1644           Data Reviewed:  I have personally reviewed the following...  CBC: Recent Labs  Lab 06/11/22 1530 06/12/22 0432  WBC 10.7* 8.9  NEUTROABS 6.5 7.2  HGB 14.0 12.4  HCT 44.3 38.6  MCV 86.0 84.3  PLT 182 202   Basic Metabolic Panel: Recent Labs  Lab 06/11/22 1530 06/12/22 0432  NA 135 133*  K 4.5 4.2  CL 97* 98  CO2 31 27  GLUCOSE 101* 378*  BUN 16 19  CREATININE 0.73 0.88  CALCIUM 8.2* 8.0*   GFR: Estimated Creatinine Clearance: 73.3 mL/min (by C-G formula based on SCr of 0.88 mg/dL). Liver Function Tests: Recent Labs  Lab 06/11/22 1530  AST 15  ALT 17  ALKPHOS 65  BILITOT 0.5  PROT 7.6  ALBUMIN 3.4*   Recent Labs  Lab 06/11/22 1530  LIPASE 24   No results for input(s): "AMMONIA" in the last 168 hours. Coagulation Profile: Recent Labs  Lab 06/11/22 1530  INR 1.1   Cardiac Enzymes: No results for input(s): "CKTOTAL", "CKMB", "CKMBINDEX", "TROPONINI" in the last 168 hours. BNP (last 3 results) No results for input(s): "PROBNP" in the last 8760 hours. HbA1C: No results for input(s): "HGBA1C" in the last 72 hours. CBG: Recent Labs  Lab 06/11/22 1557 06/11/22 1934 06/11/22 2103 06/12/22 0727 06/12/22 1157  GLUCAP 101* 68* 120* 276* 323*   Lipid Profile: No results for input(s): "CHOL", "HDL", "LDLCALC", "TRIG", "CHOLHDL", "LDLDIRECT" in the last 72 hours. Thyroid Function Tests: No results for input(s): "TSH", "T4TOTAL", "FREET4", "T3FREE", "THYROIDAB" in the last 72 hours. Anemia Panel: No results for input(s): "VITAMINB12", "FOLATE", "FERRITIN", "TIBC", "IRON", "RETICCTPCT" in the last 72 hours. Most Recent Urinalysis On File:     Component Value Date/Time   COLORURINE YELLOW (A) 06/11/2022 1924   APPEARANCEUR HAZY (A) 06/11/2022 1924   APPEARANCEUR Clear 11/17/2013 1155   LABSPEC 1.019 06/11/2022 1924   LABSPEC 1.006  11/17/2013 1155   PHURINE 5.0 06/11/2022 1924   GLUCOSEU NEGATIVE 06/11/2022 1924   GLUCOSEU Negative 11/17/2013 1155   HGBUR SMALL (A) 06/11/2022 1924   BILIRUBINUR NEGATIVE 06/11/2022 1924   BILIRUBINUR Neg 09/28/2017 1207   BILIRUBINUR Negative 11/17/2013 1155   KETONESUR NEGATIVE 06/11/2022 1924   PROTEINUR 100 (A) 06/11/2022 1924   UROBILINOGEN 0.2 09/28/2017 1207   NITRITE POSITIVE (A) 06/11/2022 1924   LEUKOCYTESUR TRACE (A) 06/11/2022 1924   LEUKOCYTESUR Trace 11/17/2013 1155   Sepsis Labs: '@LABRCNTIP'$ (procalcitonin:4,lacticidven:4) Microbiology: Recent Results (from the past 240 hour(s))  Resp panel by RT-PCR (RSV, Flu A&B, Covid) Anterior Nasal Swab     Status: None   Collection Time: 06/11/22  3:30 PM   Specimen: Anterior Nasal Swab  Result Value Ref Range Status   SARS Coronavirus 2 by RT PCR NEGATIVE NEGATIVE Final    Comment: (NOTE) SARS-CoV-2 target nucleic acids are NOT DETECTED.  The SARS-CoV-2 RNA is generally detectable in upper respiratory specimens during the acute phase of infection. The lowest concentration of SARS-CoV-2 viral copies this assay can detect is 138 copies/mL. A negative result does not preclude SARS-Cov-2 infection and should not be used as the sole basis for treatment or other patient management decisions. A negative result may occur with  improper specimen collection/handling, submission of specimen other than nasopharyngeal swab, presence of viral mutation(s) within the areas targeted by this assay, and inadequate number of viral copies(<138 copies/mL). A negative result must be combined with clinical observations, patient history, and epidemiological information. The expected result is Negative.  Fact Sheet for Patients:  EntrepreneurPulse.com.au  Fact Sheet for Healthcare Providers:  IncredibleEmployment.be  This test is no t yet approved or cleared by the [place] FDA and  has been  authorized for detection and/or diagnosis of SARS-CoV-2 by FDA under an Emergency Use Authorization (EUA). This EUA will remain  in effect (meaning this test can be used) for the duration of the COVID-19 declaration under Section  564(b)(1) of the Act, 21 U.S.C.section 360bbb-3(b)(1), unless the authorization is terminated  or revoked sooner.       Influenza A by PCR NEGATIVE NEGATIVE Final   Influenza B by PCR NEGATIVE NEGATIVE Final    Comment: (NOTE) The Xpert Xpress SARS-CoV-2/FLU/RSV plus assay is intended as an aid in the diagnosis of influenza from Nasopharyngeal swab specimens and should not be used as a sole basis for treatment. Nasal washings and aspirates are unacceptable for Xpert Xpress SARS-CoV-2/FLU/RSV testing.  Fact Sheet for Patients: EntrepreneurPulse.com.au  Fact Sheet for Healthcare Providers: IncredibleEmployment.be  This test is not yet approved or cleared by the [place] FDA and has been authorized for detection and/or diagnosis of SARS-CoV-2 by FDA under an Emergency Use Authorization (EUA). This EUA will remain in effect (meaning this test can be used) for the duration of the COVID-19 declaration under Section 564(b)(1) of the Act, 21 U.S.C. section 360bbb-3(b)(1), unless the authorization is terminated or revoked.     Resp Syncytial Virus by PCR NEGATIVE NEGATIVE Final    Comment: (NOTE) Fact Sheet for Patients: EntrepreneurPulse.com.au  Fact Sheet for Healthcare Providers: IncredibleEmployment.be  This test is not yet approved or cleared by the [place] FDA and has been authorized for detection and/or diagnosis of SARS-CoV-2 by FDA under an Emergency Use Authorization (EUA). This EUA will remain in effect (meaning this test can be used) for the duration of the COVID-19 declaration under Section 564(b)(1) of the Act, 21 U.S.C. section 360bbb-3(b)(1), unless the  authorization is terminated or revoked.  Performed at [facility], [address] [place], [place] [address]   Blood Culture (routine x 2)     Status: None (Preliminary result)   Collection Time: 06/11/22  3:30 PM   Specimen: BLOOD  Result Value Ref Range Status   Specimen Description BLOOD BLOOD RIGHT HAND  Final   Special Requests   Final    BOTTLES DRAWN AEROBIC AND ANAEROBIC Blood Culture adequate volume   Culture   Final    NO GROWTH < 24 HOURS Performed at [facility], [address] [place], [place] [address]    Report Status PENDING  Incomplete  Blood Culture (routine x 2)     Status: None (Preliminary result)   Collection Time: 06/11/22  3:30 PM   Specimen: BLOOD  Result Value Ref Range Status   Specimen Description BLOOD BLOOD RIGHT ARM  Final   Special Requests   Final    BOTTLES DRAWN AEROBIC AND ANAEROBIC Blood Culture adequate volume   Culture   Final    NO GROWTH < 24 HOURS Performed at [facility], [address], [place], [place] [address]    Report Status PENDING  Incomplete      Radiology Studies last 3 days: CT Angio Chest PE W and/or Wo Contrast  Result Date: 06/11/2022 CLINICAL DATA:  Pulmonary embolism (PE) suspected, high prob; Abdominal pain, acute, nonlocalized EXAM: CT ANGIOGRAPHY CHEST CT ABDOMEN AND PELVIS WITH CONTRAST TECHNIQUE: Multidetector CT imaging of the chest was performed using the standard protocol during bolus administration of intravenous contrast. Multiplanar CT image reconstructions and MIPs were obtained to evaluate the vascular anatomy. Multidetector CT imaging of the abdomen and pelvis was performed using the standard protocol during bolus administration of intravenous contrast. RADIATION DOSE REDUCTION: This exam was performed according to the departmental dose-optimization program which includes automated exposure control, adjustment of the mA and/or kV according to patient size and/or use of  iterative reconstruction technique.  CONTRAST:  198m OMNIPAQUE IOHEXOL 350 MG/ML SOLN COMPARISON:  February 29, 2020. February 06, 2012 FINDINGS: CTA CHEST FINDINGS Cardiovascular: Satisfactory opacification of the pulmonary arteries to the segmental level. No evidence of pulmonary embolism. Cardiomegaly. No pericardial effusion. Three-vessel coronary artery atherosclerotic calcifications. Scattered atherosclerotic calcifications of the nonaneurysmal thoracic aorta. Mild enlargement of the pulmonary artery in relation to the ascending thoracic aorta. Mediastinum/Nodes: Visualized thyroid is unremarkable. No axillary adenopathy. Prominent pre aortic lymph node measuring 10 mm in the short axis (series 10, image 126). Lungs/Pleura: No pleural effusion or pneumothorax. Mild bronchial wall thickening with endobronchial debris predominately within the LEFT lower lobe. There are downstream scattered hypoenhancing platelike opacities within the LEFT lower lobe. Mild centrilobular emphysema. There are scattered pulmonary nodules with representative nodules as follows: Nodule 1: RIGHT upper lobe pulmonary nodule measures 2 mm (series 10, image 157). Nodule 2: LEFT upper lobe pulmonary nodule measures 4 mm (series 10, image 142). Musculoskeletal: No chest wall abnormality. No acute or significant osseous findings. Review of the MIP images confirms the above findings. CT ABDOMEN and PELVIS FINDINGS Hepatobiliary: Status post cholecystectomy. Mild dilation of the common bile duct to approximately 11 mm, likely due to post cholecystectomy state. Hepatomegaly. Pancreas: Unremarkable. No pancreatic ductal dilatation or surrounding inflammatory changes. Spleen: Normal in size without focal abnormality. Adrenals/Urinary Tract: 15 mm RIGHT adrenal adenoma, stable since at least 2013 and consistent with a benign etiology (for which no dedicated imaging follow-up is recommended). LEFT adrenal gland is unremarkable. Kidneys enhance  symmetrically. No hydronephrosis. No obstructing nephrolithiasis. Bladder is unremarkable. Stomach/Bowel: No evidence of bowel obstruction. Moderate to large colonic stool burden predominately within the RIGHT hemicolon. Small hiatal hernia. No ancillary evidence of acute appendicitis. Vascular/Lymphatic: Atherosclerotic calcifications of the nonaneurysmal abdominal aorta. No new suspicious lymphadenopathy. Reproductive: Status post hysterectomy. No adnexal masses. Other: Small fat containing ventral hernia of the RIGHT abdomen. Musculoskeletal: Status post RIGHT hip arthroplasty. Advanced degenerative changes of the LEFT hip. Review of the MIP images confirms the above findings. IMPRESSION: 1. No evidence of pulmonary embolism. 2. Mild bronchial wall thickening with endobronchial debris predominately within the LEFT lower lobe. There are downstream scattered hypoenhancing platelike opacities within the LEFT lower lobe. Findings could reflect sequela of aspiration or infection. 3. Scattered pulmonary nodules measuring up to 4 mm. No follow-up needed if patient is low-risk (and has no known or suspected primary neoplasm). Non-contrast chest CT can be considered in 12 months if patient is high-risk. This recommendation follows the consensus statement: Guidelines for Management of Incidental Pulmonary Nodules Detected on CT Images: From the Fleischner Society 2017; Radiology 2017; 284:228-243. 4. Moderate to large colonic stool burden predominately within the RIGHT hemicolon. 5. Mild enlargement of the pulmonary artery in relation to the ascending thoracic aorta. This can be seen in the setting of pulmonary arterial hypertension. 6. Small hiatal hernia. Aortic Atherosclerosis (ICD10-I70.0) and Emphysema (ICD10-J43.9). Electronically Signed   By: SValentino SaxonM.D.   On: 06/11/2022 19:31   CT ABDOMEN PELVIS W CONTRAST  Result Date: 06/11/2022 CLINICAL DATA:  Pulmonary embolism (PE) suspected, high prob;  Abdominal pain, acute, nonlocalized EXAM: CT ANGIOGRAPHY CHEST CT ABDOMEN AND PELVIS WITH CONTRAST TECHNIQUE: Multidetector CT imaging of the chest was performed using the standard protocol during bolus administration of intravenous contrast. Multiplanar CT image reconstructions and MIPs were obtained to evaluate the vascular anatomy. Multidetector CT imaging of the abdomen and pelvis was performed using the standard protocol during bolus administration of intravenous contrast. RADIATION DOSE REDUCTION:  This exam was performed according to the departmental dose-optimization program which includes automated exposure control, adjustment of the mA and/or kV according to patient size and/or use of iterative reconstruction technique. CONTRAST:  111m OMNIPAQUE IOHEXOL 350 MG/ML SOLN COMPARISON:  February 29, 2020. February 06, 2012 FINDINGS: CTA CHEST FINDINGS Cardiovascular: Satisfactory opacification of the pulmonary arteries to the segmental level. No evidence of pulmonary embolism. Cardiomegaly. No pericardial effusion. Three-vessel coronary artery atherosclerotic calcifications. Scattered atherosclerotic calcifications of the nonaneurysmal thoracic aorta. Mild enlargement of the pulmonary artery in relation to the ascending thoracic aorta. Mediastinum/Nodes: Visualized thyroid is unremarkable. No axillary adenopathy. Prominent pre aortic lymph node measuring 10 mm in the short axis (series 10, image 126). Lungs/Pleura: No pleural effusion or pneumothorax. Mild bronchial wall thickening with endobronchial debris predominately within the LEFT lower lobe. There are downstream scattered hypoenhancing platelike opacities within the LEFT lower lobe. Mild centrilobular emphysema. There are scattered pulmonary nodules with representative nodules as follows: Nodule 1: RIGHT upper lobe pulmonary nodule measures 2 mm (series 10, image 157). Nodule 2: LEFT upper lobe pulmonary nodule measures 4 mm (series 10, image 142).  Musculoskeletal: No chest wall abnormality. No acute or significant osseous findings. Review of the MIP images confirms the above findings. CT ABDOMEN and PELVIS FINDINGS Hepatobiliary: Status post cholecystectomy. Mild dilation of the common bile duct to approximately 11 mm, likely due to post cholecystectomy state. Hepatomegaly. Pancreas: Unremarkable. No pancreatic ductal dilatation or surrounding inflammatory changes. Spleen: Normal in size without focal abnormality. Adrenals/Urinary Tract: 15 mm RIGHT adrenal adenoma, stable since at least 2013 and consistent with a benign etiology (for which no dedicated imaging follow-up is recommended). LEFT adrenal gland is unremarkable. Kidneys enhance symmetrically. No hydronephrosis. No obstructing nephrolithiasis. Bladder is unremarkable. Stomach/Bowel: No evidence of bowel obstruction. Moderate to large colonic stool burden predominately within the RIGHT hemicolon. Small hiatal hernia. No ancillary evidence of acute appendicitis. Vascular/Lymphatic: Atherosclerotic calcifications of the nonaneurysmal abdominal aorta. No new suspicious lymphadenopathy. Reproductive: Status post hysterectomy. No adnexal masses. Other: Small fat containing ventral hernia of the RIGHT abdomen. Musculoskeletal: Status post RIGHT hip arthroplasty. Advanced degenerative changes of the LEFT hip. Review of the MIP images confirms the above findings. IMPRESSION: 1. No evidence of pulmonary embolism. 2. Mild bronchial wall thickening with endobronchial debris predominately within the LEFT lower lobe. There are downstream scattered hypoenhancing platelike opacities within the LEFT lower lobe. Findings could reflect sequela of aspiration or infection. 3. Scattered pulmonary nodules measuring up to 4 mm. No follow-up needed if patient is low-risk (and has no known or suspected primary neoplasm). Non-contrast chest CT can be considered in 12 months if patient is high-risk. This recommendation follows  the consensus statement: Guidelines for Management of Incidental Pulmonary Nodules Detected on CT Images: From the Fleischner Society 2017; Radiology 2017; 284:228-243. 4. Moderate to large colonic stool burden predominately within the RIGHT hemicolon. 5. Mild enlargement of the pulmonary artery in relation to the ascending thoracic aorta. This can be seen in the setting of pulmonary arterial hypertension. 6. Small hiatal hernia. Aortic Atherosclerosis (ICD10-I70.0) and Emphysema (ICD10-J43.9). Electronically Signed   By: SValentino SaxonM.D.   On: 06/11/2022 19:31   CT HEAD WO CONTRAST (5MM)  Result Date: 06/11/2022 CLINICAL DATA:  Headache, fever, confusion, urinary tract infection EXAM: CT HEAD WITHOUT CONTRAST TECHNIQUE: Contiguous axial images were obtained from the base of the skull through the vertex without intravenous contrast. RADIATION DOSE REDUCTION: This exam was performed according to the departmental dose-optimization program which includes  automated exposure control, adjustment of the mA and/or kV according to patient size and/or use of iterative reconstruction technique. COMPARISON:  06/21/2017 FINDINGS: Brain: Hypodensities within the periventricular white matter, bilateral basal ganglia, and right cerebellar hemisphere are consistent with chronic small vessel ischemic changes. No acute infarct or hemorrhage. Lateral ventricles and remaining midline structures are unremarkable. No acute extra-axial fluid collections. No mass effect. Vascular: No hyperdense vessel or unexpected calcification. Skull: Normal. Negative for fracture or focal lesion. Sinuses/Orbits: No acute finding. Other: None. IMPRESSION: 1. No acute intracranial process. Chronic small vessel ischemic changes as above. Electronically Signed   By: Randa Ngo M.D.   On: 06/11/2022 19:16   DG Chest Port 1 View  Result Date: 06/11/2022 CLINICAL DATA:  Questionable sepsis - evaluate for abnormality EXAM: PORTABLE CHEST 1  VIEW COMPARISON:  November 05, 2021 FINDINGS: The cardiomediastinal silhouette is unchanged in contour. No pleural effusion. No pneumothorax. Increased reticulonodular markings of bilateral bases with prominent bronchial wall markings. IMPRESSION: Increased reticulonodular markings of bilateral bases. Differential considerations include pulmonary edema versus atypical infection. Electronically Signed   By: Valentino Saxon M.D.   On: 06/11/2022 15:48             LOS: 1 day    Time spent: 35 min    Emeterio Reeve, DO [facility] 06/12/2022, 1:50 PM    Dictation software may have been used to generate the above note. Typos may occur and escape review in typed/dictated notes. Please contact Dr Sheppard Coil directly for clarity if needed.  Staff may message me via secure chat in [place]  but this may not receive an immediate response,  please page me for urgent matters!  If 7PM-7AM, please contact night coverage [email or web address]

## 2022-06-12 NOTE — ED Notes (Signed)
Patient placed on hospital bed at this time.

## 2022-06-12 NOTE — Hospital Course (Addendum)
Amy Meyers. A 62 y/o with medical history of chronic pain syndrome, DM2, HTN reports a two week h/o coughing productive of thick bitter tasting mucus but not SOB, she also reports dysuria for a week or more. On inquiry she has a h/o choking when she eats but denies any aspiration events over the past 48 hrs. Today her daughters noted she was somnolent and when aroused she was confused. EMS was activated: EMTs recorded a temperature of 103, hypoxemia. She was treated in the field with oxygen and was transported to [facility]ED for further evaluation.   01/14: Tmax 101.8 to 98.3 108/60 HR 110 to 74 RR 11. No signicant findings EDP exam. Lab - VBG 7.3/76/<31 Cmet OK WBC 10.7 with 62/27/9/2 eos, Hgb 14, U/A - many bacteria and (+)nitrite, WBC6-10. CTA chest - no PE, mild bronchial wall thickening, plaque LLL c/w sequellae of aspiration vs infection, no infiltrate, no pulmonary edema., mild centrolobular emphysema. CT Abd/Pelv no acute findings. Patient treated with duoneb x 1, solumedrol 125 mg IV for wheezing, BiPAP for hypercarbia x 2-3 hrs now on [place] O2, 1 L LR bolus (had 1L bolus in the field). Abx started Vanc/Cefepime/Flagyl.  --> admitted, concern aspiration PNA +/- COPD, possible UTI. Narrow abx to cefepime, started steroids, nebs. Procal <0.10, lactate WNL,  01/15: WBC WNL, VSS improving, await cultures, continue abx as below 01/16: UCx NG but UA(+)nitrite, possible contaminant / false neg Cx? Continue abx. Still on O2 but tapering down. Ambulate w/ O2 measurement today  Consultants:  none  Procedures: none      ASSESSMENT & PLAN:   Principal Problem:   Sepsis ([place]) Active Problems:   Current tobacco use   Type 2 diabetes mellitus with microalbuminuria, with long-term current use of insulin (HCC)   Chronic pain syndrome   Emphysema lung (HCC)   Hypertension   Anxiety   ADD (attention deficit disorder) without hyperactivity  Sepsis ([place]) likely d/t UTI +/- pneumonia community  acquired/aspiration vs aspiration pneumonitis Cefepime  Emphysema lung (HCC) Acute hypoxic respiratory failure likely d/t pneumonia vs COPD Current tobacco use Smoking cessation, Nicotine patch - smokes 10 cigarettes per day Prednisone taper: 40 mg in AM then taper down per symptoms Duonebs q6 x 4 doses and re-evaluate Supplemental O2 as needed - wean as tolerated   Anxiety Does not take anxiolytics at home. Xanax 0.5 mg q 6 prn anxiety  Chronic pain syndrome Patient reports a 23 year h/o chronic pain starting when she had THR right. She has been followed in a pain clinic by Dr. Primus Bravo until his retirement and she now has a new doctor. She was taking methadone up to 70 mg daily. Under her new clinician she has had methadone reduced to '10mg'$  TID and has started oxycodone '10mg'$  q 4 hrs. Continue home regimen.   ADD (attention deficit disorder) without hyperactivity Continue home regimen  Hypertension Patient takes no medication at home and denies having this diagnosis Routine vitals Medication recommendations to be based on BP readings  Type 2 diabetes mellitus with microalbuminuria, with long-term current use of insulin (HCC) A1C Continue Lantus 80 u BID Sliding scale coverage.     DVT prophylaxis: lovenox  Pertinent IV fluids/nutrition: IV fluids d/c Central lines / invasive devices: none  Code Status: FULL CODE   Current Admission Status: inpatient   TOC needs / Dispo plan: none Barriers to discharge / significant pending items: O2 requirement, anticipate may be stable for discharge tomorrow 01/17 of able to wean off O2 /  arrange home O2

## 2022-06-12 NOTE — Inpatient Diabetes Management (Signed)
Inpatient Diabetes Program Recommendations  AACE/ADA: New Consensus Statement on Inpatient Glycemic Control   Target Ranges:  Prepandial:   less than 140 mg/dL      Peak postprandial:   less than 180 mg/dL (1-2 hours)      Critically ill patients:  140 - 180 mg/dL    Latest Reference Range & Units 06/11/22 15:57 06/11/22 19:34 06/11/22 21:03 06/12/22 07:27 06/12/22 11:57  Glucose-Capillary 70 - 99 mg/dL 101 (H) 68 (L) 120 (H) 276 (H) 323 (H)   Review of Glycemic Control  Diabetes history: DM2 Outpatient Diabetes medications: Lantus 80 units BID, Farxiga 10 mg daily (not taking)  Current orders for Inpatient glycemic control: Semglee 80 units BID, Novolog 0-20 units TID with meals  Inpatient Diabetes Program Recommendations:    HbgA1C:  A1C in process.  Outpatient DM needs: Patient had recently ran out of several medications due to being in the doughnut hole. New calendar year should reset her medication coverage. Have asked outpatient [facility] pharmacy to check to see what copays for medications would be.  At time of discharge, please provide Rx for Lantus SoloStar pens [phone]), insulin pen needles [phone]), and [place].   Note 06/12/22'@15'$ :10-Spoke with patient and her daughter at bedside in ED about diabetes and home regimen for diabetes control. Patient reports being followed by PCP for diabetes management and currently taking Lantus 80 units BID as an outpatient for diabetes control. Inquired about Wilder Glade and patient states that Wilder Glade was too expensive so she could not get it filled the last time. Patient notes that she went into the doughnut hole and her medications were very expensive and she was not able to get them all filled. She reports she ran out of insulin for a few day but she now has some Lantus.  Patient states that she prefers to use Lantus pens but the pen needles were to expensive for her to get filled so she was drawing the insulin out of pens with insulin syringes.   Patient  has not been checking glucose very often because she could not afford the test strips (over $50 for box of 50 test strips); prior to running out patient was checking glucose a few times a week (using strips sparingly).  Inquired if patient has tried to fill any meds since new calendar year and she has not. Anticipate since it is a new year, she should no longer be in doughnut hole. Will ask outpatient [facility] pharmacy to see what copays for Lantus, Farxiga, insulin pen needles would be. Inquired about any prior use or knowledge of FreeStyle Libre CGM and patient has no prior knowledge. Discussed FreeStyle Libre3 and how it could provide more information on glucose trends and help her as well as her provider to make adjustments with DM medication. Patient states that she can feel when she has hypoglycemia and notes that she had symptoms on 06/11/22 when glucose was 68 mg/dl. Patient states that she rarely has hypoglycemia as outpatient.    Informed patient that current A1C is in process. Patient reports that last A1C was in 7% range.  Discussed glucose and A1C goals. Discussed importance of checking CBGs and maintaining good CBG control to prevent long-term and short-term complications. Explained how hyperglycemia leads to damage within blood vessels which lead to the common complications seen with uncontrolled diabetes. Stressed to the patient the importance of improving glycemic control to prevent further complications from uncontrolled diabetes. Discussed impact of nutrition, exercise, stress, sickness, and medications on diabetes control.  Informed patient that it would be requested that our outpatient [facility] pharmacy check to see copays for Lantus, Farxiga, and insulin pen needles. Patient will think about the FreeStyle Libre3 and let diabetes team know if she would be interested in trying it.  Discussed Reli-On glucometer and testing supplies at [facility] which may be more affordable in times when patient is in the  doughnut hole. Patient reports that she is raising 2 of her grandchildren (her son passed away so she is raising his children). Inquired if patient has applied for Medicaid and she reports that she has applied and she thought she was approved but has not gotten anything in the mail regarding Medicaid. Encouraged patient to reach out to her caseworker at [facility] to inquire about Medicaid since her medication copay would only be $4 each if she has Medicaid.  Patient verbalized understanding of information discussed and reports no further questions at this time related to diabetes.  Thanks, Barnie Alderman, RN, MSN, [place] [facility] Coordinator Inpatient Diabetes Program [phone] (Team Pager from 8am to [place])

## 2022-06-12 NOTE — ED Notes (Signed)
Temperature deferred until after breakfast

## 2022-06-13 ENCOUNTER — Other Ambulatory Visit (HOSPITAL_COMMUNITY): Payer: Self-pay

## 2022-06-13 DIAGNOSIS — I10 Essential (primary) hypertension: Secondary | ICD-10-CM | POA: Diagnosis not present

## 2022-06-13 DIAGNOSIS — A419 Sepsis, unspecified organism: Secondary | ICD-10-CM | POA: Diagnosis not present

## 2022-06-13 DIAGNOSIS — G894 Chronic pain syndrome: Secondary | ICD-10-CM | POA: Diagnosis not present

## 2022-06-13 DIAGNOSIS — J438 Other emphysema: Secondary | ICD-10-CM | POA: Diagnosis not present

## 2022-06-13 LAB — HEMOGLOBIN A1C
Hgb A1c MFr Bld: 8.9 % — ABNORMAL HIGH (ref 4.8–5.6)
Mean Plasma Glucose: 208.73 mg/dL

## 2022-06-13 LAB — BASIC METABOLIC PANEL
Anion gap: 5 (ref 5–15)
BUN: 17 mg/dL (ref 8–23)
CO2: 30 mmol/L (ref 22–32)
Calcium: 8.1 mg/dL — ABNORMAL LOW (ref 8.9–10.3)
Chloride: 101 mmol/L (ref 98–111)
Creatinine, Ser: 0.76 mg/dL (ref 0.44–1.00)
GFR, Estimated: >60 mL/min (ref >60)
Glucose, Bld: 275 mg/dL — ABNORMAL HIGH (ref 70–99)
Potassium: 4 mmol/L (ref 3.5–5.1)
Sodium: 136 mmol/L (ref 135–145)

## 2022-06-13 LAB — GLUCOSE, CAPILLARY
Glucose-Capillary: 174 mg/dL — ABNORMAL HIGH (ref 70–99)
Glucose-Capillary: 206 mg/dL — ABNORMAL HIGH (ref 70–99)
Glucose-Capillary: 118 mg/dL — ABNORMAL HIGH (ref 70–99)
Glucose-Capillary: 54 mg/dL — ABNORMAL LOW (ref 70–99)
Glucose-Capillary: 63 mg/dL — ABNORMAL LOW (ref 70–99)
Glucose-Capillary: 123 mg/dL — ABNORMAL HIGH (ref 70–99)

## 2022-06-13 NOTE — Inpatient Diabetes Management (Addendum)
Inpatient Diabetes Program Recommendations  AACE/ADA: New Consensus Statement on Inpatient Glycemic Control (2015)  Target Ranges:  Prepandial:   less than 140 mg/dL      Peak postprandial:   less than 180 mg/dL (1-2 hours)      Critically ill patients:  140 - 180 mg/dL   Lab Results  Component Value Date   GLUCAP 174 (H) 06/13/2022   HGBA1C 8.9 (H) 06/12/2022    NOTE: TOC pharmacy ran a benefit check.  Wilder Glade copay is $472.42 due to a $545.00 deductible.  Basaglar was filled on 06/08/22.  [facility] will require a prior authorization.  The Freestyle Libre should cost up to $75/month.   Will continue to follow while inpatient.  Thank you, Reche Dixon, MSN, [place] [facility] Coordinator Inpatient Diabetes Program [phone] (team pager from 8a-5p)

## 2022-06-13 NOTE — Progress Notes (Signed)
PROGRESS NOTE    Amy Meyers   EQA:[id number] DOB: 06-26-1960  DOA: 06/11/2022 Date of Service: 06/13/22 PCP: Birdie Sons, MD     Brief Narrative / Hospital Course:  Amy Meyers. A 62 y/o with medical history of chronic pain syndrome, DM2, HTN reports a two week h/o coughing productive of thick bitter tasting mucus but not SOB, she also reports dysuria for a week or more. On inquiry she has a h/o choking when she eats but denies any aspiration events over the past 48 hrs. Today her daughters noted she was somnolent and when aroused she was confused. EMS was activated: EMTs recorded a temperature of 103, hypoxemia. She was treated in the field with oxygen and was transported to [facility] for further evaluation.   01/14: Tmax 101.8 to 98.3 108/60 HR 110 to 74 RR 11. No signicant findings EDP exam. Lab - VBG 7.3/76/<31 Cmet OK WBC 10.7 with 62/27/9/2 eos, Hgb 14, U/A - many bacteria and (+)nitrite, WBC6-10. CTA chest - no PE, mild bronchial wall thickening, plaque LLL c/w sequellae of aspiration vs infection, no infiltrate, no pulmonary edema., mild centrolobular emphysema. CT Abd/Pelv no acute findings. Patient treated with duoneb x 1, solumedrol 125 mg IV for wheezing, BiPAP for hypercarbia x 2-3 hrs now on [place] O2, 1 L LR bolus (had 1L bolus in the field). Abx started Vanc/Cefepime/Flagyl.  --> admitted, concern aspiration PNA +/- COPD, possible UTI. Narrow abx to cefepime, started steroids, nebs. Procal <0.10, lactate WNL,  01/15: WBC WNL, VSS improving, await cultures, continue abx as below 01/16: UCx NG but UA(+)nitrite, possible contaminant / false neg Cx? Continue abx. Still on O2 but tapering down. Ambulate w/ O2 measurement today  Consultants:  none  Procedures: none      ASSESSMENT & PLAN:   Principal Problem:   Sepsis ([place]) Active Problems:   Current tobacco use   Type 2 diabetes mellitus with microalbuminuria, with long-term current use of insulin (HCC)   Chronic pain  syndrome   Emphysema lung (HCC)   Hypertension   Anxiety   ADD (attention deficit disorder) without hyperactivity  Sepsis ([place]) likely d/t UTI +/- pneumonia community acquired/aspiration vs aspiration pneumonitis Cefepime  Emphysema lung (HCC) Acute hypoxic respiratory failure likely d/t pneumonia vs COPD Current tobacco use Smoking cessation, Nicotine patch - smokes 10 cigarettes per day Prednisone taper: 40 mg in AM then taper down per symptoms Duonebs q6 x 4 doses and re-evaluate Supplemental O2 as needed - wean as tolerated   Anxiety Does not take anxiolytics at home. Xanax 0.5 mg q 6 prn anxiety  Chronic pain syndrome Patient reports a 23 year h/o chronic pain starting when she had THR right. She has been followed in a pain clinic by Dr. Primus Bravo until his retirement and she now has a new doctor. She was taking methadone up to 70 mg daily. Under her new clinician she has had methadone reduced to '10mg'$  TID and has started oxycodone '10mg'$  q 4 hrs. Continue home regimen.   ADD (attention deficit disorder) without hyperactivity Continue home regimen  Hypertension Patient takes no medication at home and denies having this diagnosis Routine vitals Medication recommendations to be based on BP readings  Type 2 diabetes mellitus with microalbuminuria, with long-term current use of insulin (HCC) A1C Continue Lantus 80 u BID Sliding scale coverage.     DVT prophylaxis: lovenox  Pertinent IV fluids/nutrition: IV fluids d/c Central lines / invasive devices: none  Code Status: FULL CODE  Current Admission Status: inpatient   TOC needs / Dispo plan: none Barriers to discharge / significant pending items: O2 requirement, anticipate may be stable for discharge tomorrow 01/17 of able to wean off O2 / arrange home O2              Subjective / Brief ROS:  Patient reports fatigue Denies CP/SOB.  Pain controlled.  Denies new weakness.  Tolerating diet.  Reports no  concerns w/ urination/defecation.   Family Communication: daughter at bedside on rounds     Objective Findings:  Vitals:   06/12/22 1600 06/12/22 2241 06/13/22 0509 06/13/22 0752  BP: 137/65 117/73 (!) 144/67 134/72  Pulse: 83 69 71 69  Resp: '15 20 16 20  '$ Temp: 97.9 F (36.6 C) 98.6 F (37 C) 97.9 F (36.6 C) 97.8 F (36.6 C)  TempSrc: Oral Oral Oral   SpO2: 96% 100% 97% 95%  Weight:      Height:        Intake/Output Summary (Last 24 hours) at 06/13/2022 1514 Last data filed at 06/13/2022 0314 Gross per 24 hour  Intake 0 ml  Output 350 ml  Net -350 ml   Filed Weights   06/11/22 1530  Weight: 88.5 kg    Examination:  Physical Exam Constitutional:      General: She is not in acute distress.    Appearance: She is obese. She is not ill-appearing.  Cardiovascular:     Rate and Rhythm: Normal rate and regular rhythm.     Heart sounds: Normal heart sounds.  Pulmonary:     Effort: Pulmonary effort is normal. No accessory muscle usage or respiratory distress.     Breath sounds: Decreased air movement present.  Neurological:     Mental Status: She is alert.          Scheduled Medications:   amphetamine-dextroamphetamine  30 mg Oral BID   aspirin EC  81 mg Oral Daily   enoxaparin (LOVENOX) injection  0.5 mg/kg Subcutaneous Q24H   insulin aspart  0-20 Units Subcutaneous TID WC   insulin glargine-yfgn  80 Units Subcutaneous BID   methadone  10 mg Oral Q8H   nicotine  14 mg Transdermal Daily   oxyCODONE  10 mg Oral Q4H   senna  1 tablet Oral BID    Continuous Infusions:  ceFEPime (MAXIPIME) IV 2 g (06/13/22 0824)    PRN Medications:  acetaminophen **OR** acetaminophen, ALPRAZolam  Antimicrobials from admission:  Anti-infectives (From admission, onward)    Start     Dose/Rate Route Frequency Ordered Stop   06/12/22 0000  ceFEPIme (MAXIPIME) 2 g in sodium chloride 0.9 % 100 mL IVPB        2 g 200 mL/hr over 30 Minutes Intravenous Every 8 hours 06/11/22  2213     06/11/22 1530  ceFEPIme (MAXIPIME) 2 g in sodium chloride 0.9 % 100 mL IVPB        2 g 200 mL/hr over 30 Minutes Intravenous  Once 06/11/22 1526 06/11/22 1644   06/11/22 1530  metroNIDAZOLE (FLAGYL) IVPB 500 mg        500 mg 100 mL/hr over 60 Minutes Intravenous  Once 06/11/22 1526 06/11/22 1644   06/11/22 1530  vancomycin (VANCOCIN) IVPB 1000 mg/200 mL premix        1,000 mg 200 mL/hr over 60 Minutes Intravenous  Once 06/11/22 1526 06/11/22 1644           Data Reviewed:  I have personally reviewed the following.Marland KitchenMarland Kitchen  CBC: Recent Labs  Lab 06/11/22 1530 06/12/22 0432  WBC 10.7* 8.9  NEUTROABS 6.5 7.2  HGB 14.0 12.4  HCT 44.3 38.6  MCV 86.0 84.3  PLT 182 161   Basic Metabolic Panel: Recent Labs  Lab 06/11/22 1530 06/12/22 0432 06/13/22 0502  NA 135 133* 136  K 4.5 4.2 4.0  CL 97* 98 101  CO2 '31 27 30  '$ GLUCOSE 101* 378* 275*  BUN '16 19 17  '$ CREATININE 0.73 0.88 0.76  CALCIUM 8.2* 8.0* 8.1*   GFR: Estimated Creatinine Clearance: 80.7 mL/min (by C-G formula based on SCr of 0.76 mg/dL). Liver Function Tests: Recent Labs  Lab 06/11/22 1530  AST 15  ALT 17  ALKPHOS 65  BILITOT 0.5  PROT 7.6  ALBUMIN 3.4*   Recent Labs  Lab 06/11/22 1530  LIPASE 24   No results for input(s): "AMMONIA" in the last 168 hours. Coagulation Profile: Recent Labs  Lab 06/11/22 1530  INR 1.1   Cardiac Enzymes: No results for input(s): "CKTOTAL", "CKMB", "CKMBINDEX", "TROPONINI" in the last 168 hours. BNP (last 3 results) No results for input(s): "PROBNP" in the last 8760 hours. HbA1C: Recent Labs    06/12/22 0432  HGBA1C 8.9*   CBG: Recent Labs  Lab 06/12/22 1620 06/12/22 2135 06/13/22 0756 06/13/22 0809 06/13/22 1117  GLUCAP 333* 221* 206* 174* 118*   Lipid Profile: No results for input(s): "CHOL", "HDL", "LDLCALC", "TRIG", "CHOLHDL", "LDLDIRECT" in the last 72 hours. Thyroid Function Tests: No results for input(s): "TSH", "T4TOTAL", "FREET4",  "T3FREE", "THYROIDAB" in the last 72 hours. Anemia Panel: No results for input(s): "VITAMINB12", "FOLATE", "FERRITIN", "TIBC", "IRON", "RETICCTPCT" in the last 72 hours. Most Recent Urinalysis On File:     Component Value Date/Time   COLORURINE YELLOW (A) 06/11/2022 1924   APPEARANCEUR HAZY (A) 06/11/2022 1924   APPEARANCEUR Clear 11/17/2013 1155   LABSPEC 1.019 06/11/2022 1924   LABSPEC 1.006 11/17/2013 1155   PHURINE 5.0 06/11/2022 1924   GLUCOSEU NEGATIVE 06/11/2022 1924   GLUCOSEU Negative 11/17/2013 1155   HGBUR SMALL (A) 06/11/2022 1924   BILIRUBINUR NEGATIVE 06/11/2022 1924   BILIRUBINUR Neg 09/28/2017 1207   BILIRUBINUR Negative 11/17/2013 1155   KETONESUR NEGATIVE 06/11/2022 1924   PROTEINUR 100 (A) 06/11/2022 1924   UROBILINOGEN 0.2 09/28/2017 1207   NITRITE POSITIVE (A) 06/11/2022 1924   LEUKOCYTESUR TRACE (A) 06/11/2022 1924   LEUKOCYTESUR Trace 11/17/2013 1155   Sepsis Labs: '@LABRCNTIP'$ (procalcitonin:4,lacticidven:4) Microbiology: Recent Results (from the past 240 hour(s))  Resp panel by RT-PCR (RSV, Flu A&B, Covid) Anterior Nasal Swab     Status: None   Collection Time: 06/11/22  3:30 PM   Specimen: Anterior Nasal Swab  Result Value Ref Range Status   SARS Coronavirus 2 by RT PCR NEGATIVE NEGATIVE Final    Comment: (NOTE) SARS-CoV-2 target nucleic acids are NOT DETECTED.  The SARS-CoV-2 RNA is generally detectable in upper respiratory specimens during the acute phase of infection. The lowest concentration of SARS-CoV-2 viral copies this assay can detect is 138 copies/mL. A negative result does not preclude SARS-Cov-2 infection and should not be used as the sole basis for treatment or other patient management decisions. A negative result may occur with  improper specimen collection/handling, submission of specimen other than nasopharyngeal swab, presence of viral mutation(s) within the areas targeted by this assay, and inadequate number of viral copies(<138  copies/mL). A negative result must be combined with clinical observations, patient history, and epidemiological information. The expected result is Negative.  Fact Sheet for  Patients:  EntrepreneurPulse.com.au  Fact Sheet for Healthcare Providers:  IncredibleEmployment.be  This test is no t yet approved or cleared by the [place] FDA and  has been authorized for detection and/or diagnosis of SARS-CoV-2 by FDA under an Emergency Use Authorization (EUA). This EUA will remain  in effect (meaning this test can be used) for the duration of the COVID-19 declaration under Section 564(b)(1) of the Act, 21 U.S.C.section 360bbb-3(b)(1), unless the authorization is terminated  or revoked sooner.       Influenza A by PCR NEGATIVE NEGATIVE Final   Influenza B by PCR NEGATIVE NEGATIVE Final    Comment: (NOTE) The Xpert Xpress SARS-CoV-2/FLU/RSV plus assay is intended as an aid in the diagnosis of influenza from Nasopharyngeal swab specimens and should not be used as a sole basis for treatment. Nasal washings and aspirates are unacceptable for Xpert Xpress SARS-CoV-2/FLU/RSV testing.  Fact Sheet for Patients: EntrepreneurPulse.com.au  Fact Sheet for Healthcare Providers: IncredibleEmployment.be  This test is not yet approved or cleared by the [place] FDA and has been authorized for detection and/or diagnosis of SARS-CoV-2 by FDA under an Emergency Use Authorization (EUA). This EUA will remain in effect (meaning this test can be used) for the duration of the COVID-19 declaration under Section 564(b)(1) of the Act, 21 U.S.C. section 360bbb-3(b)(1), unless the authorization is terminated or revoked.     Resp Syncytial Virus by PCR NEGATIVE NEGATIVE Final    Comment: (NOTE) Fact Sheet for Patients: EntrepreneurPulse.com.au  Fact Sheet for Healthcare  Providers: IncredibleEmployment.be  This test is not yet approved or cleared by the [place] FDA and has been authorized for detection and/or diagnosis of SARS-CoV-2 by FDA under an Emergency Use Authorization (EUA). This EUA will remain in effect (meaning this test can be used) for the duration of the COVID-19 declaration under Section 564(b)(1) of the Act, 21 U.S.C. section 360bbb-3(b)(1), unless the authorization is terminated or revoked.  Performed at [facility], [address] [place], [place] [address]   Blood Culture (routine x 2)     Status: None (Preliminary result)   Collection Time: 06/11/22  3:30 PM   Specimen: BLOOD  Result Value Ref Range Status   Specimen Description BLOOD BLOOD RIGHT HAND  Final   Special Requests   Final    BOTTLES DRAWN AEROBIC AND ANAEROBIC Blood Culture adequate volume   Culture   Final    NO GROWTH 2 DAYS Performed at [facility], [address], [place], [place] [address]    Report Status PENDING  Incomplete  Blood Culture (routine x 2)     Status: None (Preliminary result)   Collection Time: 06/11/22  3:30 PM   Specimen: BLOOD  Result Value Ref Range Status   Specimen Description BLOOD BLOOD RIGHT ARM  Final   Special Requests   Final    BOTTLES DRAWN AEROBIC AND ANAEROBIC Blood Culture adequate volume   Culture   Final    NO GROWTH 2 DAYS Performed at [facility], [address] [place], [place] [address]    Report Status PENDING  Incomplete  Urine Culture     Status: None   Collection Time: 06/11/22  7:24 PM   Specimen: In/Out Cath Urine  Result Value Ref Range Status   Specimen Description   Final    IN/OUT CATH URINE Performed at [facility], [address], [place],  [address]    Special Requests   Final    NONE Performed  at [facility], [address], [place], [place] [address]    Culture   Final    NO GROWTH Performed at [facility], [place] [address], [place], [place] [address]    Report Status 06/12/2022 FINAL  Final      Radiology Studies last 3 days: CT Angio Chest PE W and/or Wo Contrast  Result Date: 06/11/2022 CLINICAL DATA:  Pulmonary embolism (PE) suspected, high prob; Abdominal pain, acute, nonlocalized EXAM: CT ANGIOGRAPHY CHEST CT ABDOMEN AND PELVIS WITH CONTRAST TECHNIQUE: Multidetector CT imaging of the chest was performed using the standard protocol during bolus administration of intravenous contrast. Multiplanar CT image reconstructions and MIPs were obtained to evaluate the vascular anatomy. Multidetector CT imaging of the abdomen and pelvis was performed using the standard protocol during bolus administration of intravenous contrast. RADIATION DOSE REDUCTION: This exam was performed according to the departmental dose-optimization program which includes automated exposure control, adjustment of the mA and/or kV according to patient size and/or use of iterative reconstruction technique. CONTRAST:  153m OMNIPAQUE IOHEXOL 350 MG/ML SOLN COMPARISON:  February 29, 2020. February 06, 2012 FINDINGS: CTA CHEST FINDINGS Cardiovascular: Satisfactory opacification of the pulmonary arteries to the segmental level. No evidence of pulmonary embolism. Cardiomegaly. No pericardial effusion. Three-vessel coronary artery atherosclerotic calcifications. Scattered atherosclerotic calcifications of the nonaneurysmal thoracic aorta. Mild enlargement of the pulmonary artery in relation to the ascending thoracic aorta. Mediastinum/Nodes: Visualized thyroid is unremarkable. No axillary adenopathy. Prominent pre aortic lymph node measuring 10 mm in the short axis (series 10, image 126). Lungs/Pleura: No pleural effusion or pneumothorax. Mild bronchial wall thickening with endobronchial debris predominately within the LEFT lower lobe. There are downstream scattered hypoenhancing platelike opacities within the LEFT lower lobe. Mild  centrilobular emphysema. There are scattered pulmonary nodules with representative nodules as follows: Nodule 1: RIGHT upper lobe pulmonary nodule measures 2 mm (series 10, image 157). Nodule 2: LEFT upper lobe pulmonary nodule measures 4 mm (series 10, image 142). Musculoskeletal: No chest wall abnormality. No acute or significant osseous findings. Review of the MIP images confirms the above findings. CT ABDOMEN and PELVIS FINDINGS Hepatobiliary: Status post cholecystectomy. Mild dilation of the common bile duct to approximately 11 mm, likely due to post cholecystectomy state. Hepatomegaly. Pancreas: Unremarkable. No pancreatic ductal dilatation or surrounding inflammatory changes. Spleen: Normal in size without focal abnormality. Adrenals/Urinary Tract: 15 mm RIGHT adrenal adenoma, stable since at least 2013 and consistent with a benign etiology (for which no dedicated imaging follow-up is recommended). LEFT adrenal gland is unremarkable. Kidneys enhance symmetrically. No hydronephrosis. No obstructing nephrolithiasis. Bladder is unremarkable. Stomach/Bowel: No evidence of bowel obstruction. Moderate to large colonic stool burden predominately within the RIGHT hemicolon. Small hiatal hernia. No ancillary evidence of acute appendicitis. Vascular/Lymphatic: Atherosclerotic calcifications of the nonaneurysmal abdominal aorta. No new suspicious lymphadenopathy. Reproductive: Status post hysterectomy. No adnexal masses. Other: Small fat containing ventral hernia of the RIGHT abdomen. Musculoskeletal: Status post RIGHT hip arthroplasty. Advanced degenerative changes of the LEFT hip. Review of the MIP images confirms the above findings. IMPRESSION: 1. No evidence of pulmonary embolism. 2. Mild bronchial wall thickening with endobronchial debris predominately within the LEFT lower lobe. There are downstream scattered hypoenhancing platelike opacities within the LEFT lower lobe. Findings could reflect sequela of aspiration  or infection. 3. Scattered pulmonary nodules measuring up to 4 mm. No follow-up needed if patient is low-risk (and has no known or suspected primary neoplasm). Non-contrast chest CT can be considered in 12 months if patient  is high-risk. This recommendation follows the consensus statement: Guidelines for Management of Incidental Pulmonary Nodules Detected on CT Images: From the Fleischner Society 2017; Radiology 2017; 284:228-243. 4. Moderate to large colonic stool burden predominately within the RIGHT hemicolon. 5. Mild enlargement of the pulmonary artery in relation to the ascending thoracic aorta. This can be seen in the setting of pulmonary arterial hypertension. 6. Small hiatal hernia. Aortic Atherosclerosis (ICD10-I70.0) and Emphysema (ICD10-J43.9). Electronically Signed   By: Valentino Saxon M.D.   On: 06/11/2022 19:31   CT ABDOMEN PELVIS W CONTRAST  Result Date: 06/11/2022 CLINICAL DATA:  Pulmonary embolism (PE) suspected, high prob; Abdominal pain, acute, nonlocalized EXAM: CT ANGIOGRAPHY CHEST CT ABDOMEN AND PELVIS WITH CONTRAST TECHNIQUE: Multidetector CT imaging of the chest was performed using the standard protocol during bolus administration of intravenous contrast. Multiplanar CT image reconstructions and MIPs were obtained to evaluate the vascular anatomy. Multidetector CT imaging of the abdomen and pelvis was performed using the standard protocol during bolus administration of intravenous contrast. RADIATION DOSE REDUCTION: This exam was performed according to the departmental dose-optimization program which includes automated exposure control, adjustment of the mA and/or kV according to patient size and/or use of iterative reconstruction technique. CONTRAST:  161m OMNIPAQUE IOHEXOL 350 MG/ML SOLN COMPARISON:  February 29, 2020. February 06, 2012 FINDINGS: CTA CHEST FINDINGS Cardiovascular: Satisfactory opacification of the pulmonary arteries to the segmental level. No evidence of pulmonary  embolism. Cardiomegaly. No pericardial effusion. Three-vessel coronary artery atherosclerotic calcifications. Scattered atherosclerotic calcifications of the nonaneurysmal thoracic aorta. Mild enlargement of the pulmonary artery in relation to the ascending thoracic aorta. Mediastinum/Nodes: Visualized thyroid is unremarkable. No axillary adenopathy. Prominent pre aortic lymph node measuring 10 mm in the short axis (series 10, image 126). Lungs/Pleura: No pleural effusion or pneumothorax. Mild bronchial wall thickening with endobronchial debris predominately within the LEFT lower lobe. There are downstream scattered hypoenhancing platelike opacities within the LEFT lower lobe. Mild centrilobular emphysema. There are scattered pulmonary nodules with representative nodules as follows: Nodule 1: RIGHT upper lobe pulmonary nodule measures 2 mm (series 10, image 157). Nodule 2: LEFT upper lobe pulmonary nodule measures 4 mm (series 10, image 142). Musculoskeletal: No chest wall abnormality. No acute or significant osseous findings. Review of the MIP images confirms the above findings. CT ABDOMEN and PELVIS FINDINGS Hepatobiliary: Status post cholecystectomy. Mild dilation of the common bile duct to approximately 11 mm, likely due to post cholecystectomy state. Hepatomegaly. Pancreas: Unremarkable. No pancreatic ductal dilatation or surrounding inflammatory changes. Spleen: Normal in size without focal abnormality. Adrenals/Urinary Tract: 15 mm RIGHT adrenal adenoma, stable since at least 2013 and consistent with a benign etiology (for which no dedicated imaging follow-up is recommended). LEFT adrenal gland is unremarkable. Kidneys enhance symmetrically. No hydronephrosis. No obstructing nephrolithiasis. Bladder is unremarkable. Stomach/Bowel: No evidence of bowel obstruction. Moderate to large colonic stool burden predominately within the RIGHT hemicolon. Small hiatal hernia. No ancillary evidence of acute appendicitis.  Vascular/Lymphatic: Atherosclerotic calcifications of the nonaneurysmal abdominal aorta. No new suspicious lymphadenopathy. Reproductive: Status post hysterectomy. No adnexal masses. Other: Small fat containing ventral hernia of the RIGHT abdomen. Musculoskeletal: Status post RIGHT hip arthroplasty. Advanced degenerative changes of the LEFT hip. Review of the MIP images confirms the above findings. IMPRESSION: 1. No evidence of pulmonary embolism. 2. Mild bronchial wall thickening with endobronchial debris predominately within the LEFT lower lobe. There are downstream scattered hypoenhancing platelike opacities within the LEFT lower lobe. Findings could reflect sequela of aspiration or infection. 3. Scattered pulmonary nodules  measuring up to 4 mm. No follow-up needed if patient is low-risk (and has no known or suspected primary neoplasm). Non-contrast chest CT can be considered in 12 months if patient is high-risk. This recommendation follows the consensus statement: Guidelines for Management of Incidental Pulmonary Nodules Detected on CT Images: From the Fleischner Society 2017; Radiology 2017; 284:228-243. 4. Moderate to large colonic stool burden predominately within the RIGHT hemicolon. 5. Mild enlargement of the pulmonary artery in relation to the ascending thoracic aorta. This can be seen in the setting of pulmonary arterial hypertension. 6. Small hiatal hernia. Aortic Atherosclerosis (ICD10-I70.0) and Emphysema (ICD10-J43.9). Electronically Signed   By: Valentino Saxon M.D.   On: 06/11/2022 19:31   CT HEAD WO CONTRAST (5MM)  Result Date: 06/11/2022 CLINICAL DATA:  Headache, fever, confusion, urinary tract infection EXAM: CT HEAD WITHOUT CONTRAST TECHNIQUE: Contiguous axial images were obtained from the base of the skull through the vertex without intravenous contrast. RADIATION DOSE REDUCTION: This exam was performed according to the departmental dose-optimization program which includes automated  exposure control, adjustment of the mA and/or kV according to patient size and/or use of iterative reconstruction technique. COMPARISON:  06/21/2017 FINDINGS: Brain: Hypodensities within the periventricular white matter, bilateral basal ganglia, and right cerebellar hemisphere are consistent with chronic small vessel ischemic changes. No acute infarct or hemorrhage. Lateral ventricles and remaining midline structures are unremarkable. No acute extra-axial fluid collections. No mass effect. Vascular: No hyperdense vessel or unexpected calcification. Skull: Normal. Negative for fracture or focal lesion. Sinuses/Orbits: No acute finding. Other: None. IMPRESSION: 1. No acute intracranial process. Chronic small vessel ischemic changes as above. Electronically Signed   By: Randa Ngo M.D.   On: 06/11/2022 19:16   DG Chest Port 1 View  Result Date: 06/11/2022 CLINICAL DATA:  Questionable sepsis - evaluate for abnormality EXAM: PORTABLE CHEST 1 VIEW COMPARISON:  November 05, 2021 FINDINGS: The cardiomediastinal silhouette is unchanged in contour. No pleural effusion. No pneumothorax. Increased reticulonodular markings of bilateral bases with prominent bronchial wall markings. IMPRESSION: Increased reticulonodular markings of bilateral bases. Differential considerations include pulmonary edema versus atypical infection. Electronically Signed   By: Valentino Saxon M.D.   On: 06/11/2022 15:48             LOS: 2 days    Time spent: 35 min    Emeterio Reeve, DO [facility] 06/13/2022, 3:14 PM    Dictation software may have been used to generate the above note. Typos may occur and escape review in typed/dictated notes. Please contact Dr Sheppard Coil directly for clarity if needed.  Staff may message me via secure chat in [place]  but this may not receive an immediate response,  please page me for urgent matters!  If 7PM-7AM, please contact night coverage [email or web address]

## 2022-06-13 NOTE — Plan of Care (Signed)
  Problem: Education: Goal: Knowledge of General Education information will improve Description: Including pain rating scale, medication(s)/side effects and non-pharmacologic comfort measures Outcome: Progressing   Problem: Clinical Measurements: Goal: Respiratory complications will improve Outcome: Progressing

## 2022-06-14 DIAGNOSIS — R6520 Severe sepsis without septic shock: Secondary | ICD-10-CM | POA: Diagnosis not present

## 2022-06-14 DIAGNOSIS — J9601 Acute respiratory failure with hypoxia: Secondary | ICD-10-CM | POA: Diagnosis not present

## 2022-06-14 DIAGNOSIS — A419 Sepsis, unspecified organism: Secondary | ICD-10-CM | POA: Diagnosis not present

## 2022-06-14 LAB — GLUCOSE, CAPILLARY
Glucose-Capillary: 156 mg/dL — ABNORMAL HIGH (ref 70–99)
Glucose-Capillary: 108 mg/dL — ABNORMAL HIGH (ref 70–99)
Glucose-Capillary: 166 mg/dL — ABNORMAL HIGH (ref 70–99)
Glucose-Capillary: 65 mg/dL — ABNORMAL LOW (ref 70–99)
Glucose-Capillary: 71 mg/dL (ref 70–99)
Glucose-Capillary: 75 mg/dL (ref 70–99)

## 2022-06-14 NOTE — Inpatient Diabetes Management (Signed)
Inpatient Diabetes Program Recommendations  AACE/ADA: New Consensus Statement on Inpatient Glycemic Control (2015)  Target Ranges:  Prepandial:   less than 140 mg/dL      Peak postprandial:   less than 180 mg/dL (1-2 hours)      Critically ill patients:  140 - 180 mg/dL   Lab Results  Component Value Date   GLUCAP 108 (H) 06/14/2022   HGBA1C 8.9 (H) 06/12/2022     Spoke with patient at bedside.  Reviewed patient's current A1c of 8.9% (average BG of 209 mg/dL). Explained what a A1c is and what it measures. Also reviewed goal A1c with patient, importance of good glucose control @ home, and blood sugar goals.  Encouraged her to try to bring her A1C down to 7% (closer to 150 mg/dL) she verbalizes understanding.   She states she will pick up more glucometer strips at [facility].  She is not interested in wearing the Freestyle [employer]re CGM.    Will continue to follow while inpatient.  Thank you, Reche Dixon, MSN, [place] Diabetes Coordinator Inpatient Diabetes Program [phone] (team pager from 8a-5p)

## 2022-06-14 NOTE — Significant Event (Signed)
Hypoglycemic Event  CBG: 65  Treatment: 4 oz juice/soda  Symptoms: None  Follow-up CBG: Time:2101 CBG Result:71  Possible Reasons for Event: Inadequate meal intake and Medication regimen:    Comments/MD notified:Brenda Randol Kern, NP  F/u CBG-75 hold semglee insulin for tonight per [facility]  Sallye Lat

## 2022-06-14 NOTE — Care Management Important Message (Signed)
Important Message  Patient Details  Name: Amy Meyers MRN: [id number] Date of Birth: 16-Feb-1961   Medicare Important Message Given:  Yes     Dannette Barbara 06/14/2022, 10:58 AM

## 2022-06-14 NOTE — Progress Notes (Signed)
PROGRESS NOTE    Amy Meyers  FTD:[id number] DOB: [birth year] DOA: 06/11/2022 PCP: Birdie Sons, MD   Brief Narrative:  This 62 y/o female with medical history significant of chronic pain syndrome, DM2, HTN reports a two week h/o coughing productive of thick bitter tasting mucus but not SOB, she also reports dysuria for a week or more. On inquiry she has  h/o choking when she eats but denies any aspiration events over the past 48 hrs. Today her daughters noted she was somnolent and when aroused she was confused. EMS was activated: EMTs recorded a temperature of 103, hypoxemia. She was treated in the field with oxygen and was transported to [facility]ED for further evaluation.   01/14: Tmax 101.8 to 98.3,  108/60 HR 110 to 74,  RR 11. No signicant findings EDP exam. Lab: U/A - many bacteria and (+)nitrite, WBC6-10. CTA chest - no PE, mild bronchial wall thickening, plaque LLL c/w sequellae of aspiration vs infection, no infiltrate, no pulmonary edema., mild centrolobular emphysema. CT Abd/Pelv no acute findings. Patient treated with duoneb x 1, solumedrol 125 mg IV for wheezing, BiPAP for hypercarbia x 2-3 hrs now on [place] O2, 1 L LR bolus (had 1L bolus in the field). Abx started Vanc/Cefepime/Flagyl.  --> admitted for concern aspiration PNA +/- COPD, possible UTI. Narrow abx to cefepime, started steroids, nebs. Procal <0.10, lactate WNL,  01/15: WBC WNL, VSS improving, await cultures, continue abx as below 01/16: UCx NG but UA(+)nitrite, possible contaminant / false neg Cx Continue abx.   Still on O2 but tapering down. Ambulate w/ O2 measurement today  Assessment & Plan:   Principal Problem:   Sepsis ([place]) Active Problems:   Current tobacco use   Type 2 diabetes mellitus with microalbuminuria, with long-term current use of insulin (HCC)   Chronic pain syndrome   Emphysema lung (HCC)   Hypertension   Anxiety   ADD (attention deficit disorder) without hyperactivity  Sepsis likely  multifactorial: Likely due to UTI, and aspiration pneumonia. Continue IV cefepime. Sepsis physiology improving.  Urine culture no growth, blood cultures no growth.  Acute hypoxic respiratory failure, likely multifactorial Could be due to pneumonia and ongoing COPD, tobacco use Smoking cessation, Nicotine patch - smokes 10 cigarettes per day Continue Prednisone taper: 40 mg in AM then taper down per symptoms Continue Duonebs q6 x 4 doses and re-evaluate Continue Supplemental O2 as needed - wean as tolerated    Anxiety disorder: Does not take anxiolytics at home. Start Xanax 0.5 mg q 6 prn anxiety.   Chronic pain syndrome: Patient reports  23 year h/o chronic pain starting when she had THR , right. She has been followed in a pain clinic by Dr. Primus Bravo until his retirement and she now has a new doctor. She was taking methadone up to 70 mg daily. Under her new clinician she has had methadone reduced to '10mg'$  TID and has started oxycodone '10mg'$  q 4 hrs. Continue home regimen.    Attention deficit disorder: Continue Adderall 30 mg twice daily.   Hypertension: Patient does not take any blood pressure medications at home. Medication recommendations to be based on BP readings   DM2 with microalbuminuria: HbA1C 8.9 poorly controlled DM Continue Lantus 80 u BID Sliding scale coverage.     DVT prophylaxis: Lovenox Code Status: Full code. Family Communication: Daughter at bed side. Disposition Plan:   Status is: Inpatient Remains inpatient appropriate because: Admitted for community-acquired pneumonia requiring supplemental oxygen, IV antibiotics.    Consultants:  None  Procedures: None  Antimicrobials:   Anti-infectives (From admission, onward)    Start     Dose/Rate Route Frequency Ordered Stop   06/12/22 0000  ceFEPIme (MAXIPIME) 2 g in sodium chloride 0.9 % 100 mL IVPB        2 g 200 mL/hr over 30 Minutes Intravenous Every 8 hours 06/11/22 2213     06/11/22 1530  ceFEPIme  (MAXIPIME) 2 g in sodium chloride 0.9 % 100 mL IVPB        2 g 200 mL/hr over 30 Minutes Intravenous  Once 06/11/22 1526 06/11/22 1644   06/11/22 1530  metroNIDAZOLE (FLAGYL) IVPB 500 mg        500 mg 100 mL/hr over 60 Minutes Intravenous  Once 06/11/22 1526 06/11/22 1644   06/11/22 1530  vancomycin (VANCOCIN) IVPB 1000 mg/200 mL premix        1,000 mg 200 mL/hr over 60 Minutes Intravenous  Once 06/11/22 1526 06/11/22 1644      Subjective: Patient was seen and examined at bedside.  Overnight events noted.   She remains on room air.  Denies any chest pain, cough or shortness of breath.  Objective: Vitals:   06/13/22 1529 06/13/22 2109 06/14/22 0448 06/14/22 0808  BP: (!) 125/50 138/60 129/79 (!) 141/61  Pulse: 71 73 74 72  Resp: '20 16 17 18  '$ Temp: 98.7 F (37.1 C) 98.7 F (37.1 C) 98.3 F (36.8 C) 98.8 F (37.1 C)  TempSrc: Oral Oral Oral Oral  SpO2: 92% 95% 96% 96%  Weight:      Height:        Intake/Output Summary (Last 24 hours) at 06/14/2022 1333 Last data filed at 06/13/2022 2300 Gross per 24 hour  Intake 240 ml  Output --  Net 240 ml   Filed Weights   06/11/22 1530  Weight: 88.5 kg    Examination:  General exam: Appears comfortable, not in any acute distress.  Deconditioned Respiratory system: CTA bilaterally, respiratory effort normal, RR 15. Cardiovascular system: S1 & S2 heard, regular rate and rhythm, no murmur. Gastrointestinal system: Abdomen is soft, non tender, non distended, BS+ Central nervous system: Alert and oriented x 3. No focal neurological deficits. Extremities: No edema, no cyanosis, no clubbing Skin: No rashes, lesions or ulcers Psychiatry: Judgement and insight appear normal. Mood & affect appropriate.     Data Reviewed: I have personally reviewed following labs and imaging studies  CBC: Recent Labs  Lab 06/11/22 1530 06/12/22 0432  WBC 10.7* 8.9  NEUTROABS 6.5 7.2  HGB 14.0 12.4  HCT 44.3 38.6  MCV 86.0 84.3  PLT 182 923    Basic Metabolic Panel: Recent Labs  Lab 06/11/22 1530 06/12/22 0432 06/13/22 0502  NA 135 133* 136  K 4.5 4.2 4.0  CL 97* 98 101  CO2 '31 27 30  '$ GLUCOSE 101* 378* 275*  BUN '16 19 17  '$ CREATININE 0.73 0.88 0.76  CALCIUM 8.2* 8.0* 8.1*   GFR: Estimated Creatinine Clearance: 80.7 mL/min (by C-G formula based on SCr of 0.76 mg/dL). Liver Function Tests: Recent Labs  Lab 06/11/22 1530  AST 15  ALT 17  ALKPHOS 65  BILITOT 0.5  PROT 7.6  ALBUMIN 3.4*   Recent Labs  Lab 06/11/22 1530  LIPASE 24   No results for input(s): "AMMONIA" in the last 168 hours. Coagulation Profile: Recent Labs  Lab 06/11/22 1530  INR 1.1   Cardiac Enzymes: No results for input(s): "CKTOTAL", "CKMB", "CKMBINDEX", "TROPONINI" in the last  168 hours. BNP (last 3 results) No results for input(s): "PROBNP" in the last 8760 hours. HbA1C: Recent Labs    06/12/22 0432  HGBA1C 8.9*   CBG: Recent Labs  Lab 06/13/22 1623 06/13/22 1649 06/13/22 2111 06/14/22 0736 06/14/22 1112  GLUCAP 54* 63* 123* 156* 108*   Lipid Profile: No results for input(s): "CHOL", "HDL", "LDLCALC", "TRIG", "CHOLHDL", "LDLDIRECT" in the last 72 hours. Thyroid Function Tests: No results for input(s): "TSH", "T4TOTAL", "FREET4", "T3FREE", "THYROIDAB" in the last 72 hours. Anemia Panel: No results for input(s): "VITAMINB12", "FOLATE", "FERRITIN", "TIBC", "IRON", "RETICCTPCT" in the last 72 hours. Sepsis Labs: Recent Labs  Lab 06/11/22 1530 06/11/22 1756  PROCALCITON  --  <0.10  LATICACIDVEN 1.6 1.4    Recent Results (from the past 240 hour(s))  Resp panel by RT-PCR (RSV, Flu A&B, Covid) Anterior Nasal Swab     Status: None   Collection Time: 06/11/22  3:30 PM   Specimen: Anterior Nasal Swab  Result Value Ref Range Status   SARS Coronavirus 2 by RT PCR NEGATIVE NEGATIVE Final    Comment: (NOTE) SARS-CoV-2 target nucleic acids are NOT DETECTED.  The SARS-CoV-2 RNA is generally detectable in upper  respiratory specimens during the acute phase of infection. The lowest concentration of SARS-CoV-2 viral copies this assay can detect is 138 copies/mL. A negative result does not preclude SARS-Cov-2 infection and should not be used as the sole basis for treatment or other patient management decisions. A negative result may occur with  improper specimen collection/handling, submission of specimen other than nasopharyngeal swab, presence of viral mutation(s) within the areas targeted by this assay, and inadequate number of viral copies(<138 copies/mL). A negative result must be combined with clinical observations, patient history, and epidemiological information. The expected result is Negative.  Fact Sheet for Patients:  EntrepreneurPulse.com.au  Fact Sheet for Healthcare Providers:  IncredibleEmployment.be  This test is no t yet approved or cleared by the [place] FDA and  has been authorized for detection and/or diagnosis of SARS-CoV-2 by FDA under an Emergency Use Authorization (EUA). This EUA will remain  in effect (meaning this test can be used) for the duration of the COVID-19 declaration under Section 564(b)(1) of the Act, 21 U.S.C.section 360bbb-3(b)(1), unless the authorization is terminated  or revoked sooner.       Influenza A by PCR NEGATIVE NEGATIVE Final   Influenza B by PCR NEGATIVE NEGATIVE Final    Comment: (NOTE) The Xpert Xpress SARS-CoV-2/FLU/RSV plus assay is intended as an aid in the diagnosis of influenza from Nasopharyngeal swab specimens and should not be used as a sole basis for treatment. Nasal washings and aspirates are unacceptable for Xpert Xpress SARS-CoV-2/FLU/RSV testing.  Fact Sheet for Patients: EntrepreneurPulse.com.au  Fact Sheet for Healthcare Providers: IncredibleEmployment.be  This test is not yet approved or cleared by the [place] FDA and has been  authorized for detection and/or diagnosis of SARS-CoV-2 by FDA under an Emergency Use Authorization (EUA). This EUA will remain in effect (meaning this test can be used) for the duration of the COVID-19 declaration under Section 564(b)(1) of the Act, 21 U.S.C. section 360bbb-3(b)(1), unless the authorization is terminated or revoked.     Resp Syncytial Virus by PCR NEGATIVE NEGATIVE Final    Comment: (NOTE) Fact Sheet for Patients: EntrepreneurPulse.com.au  Fact Sheet for Healthcare Providers: IncredibleEmployment.be  This test is not yet approved or cleared by the [place] FDA and has been authorized for detection and/or diagnosis of SARS-CoV-2 by FDA under an  Emergency Use Authorization (EUA). This EUA will remain in effect (meaning this test can be used) for the duration of the COVID-19 declaration under Section 564(b)(1) of the Act, 21 U.S.C. section 360bbb-3(b)(1), unless the authorization is terminated or revoked.  Performed at [facility] [address] [place], [place] [address]   Blood Culture (routine x 2)     Status: None (Preliminary result)   Collection Time: 06/11/22  3:30 PM   Specimen: BLOOD  Result Value Ref Range Status   Specimen Description BLOOD BLOOD RIGHT HAND  Final   Special Requests   Final    BOTTLES DRAWN AEROBIC AND ANAEROBIC Blood Culture adequate volume   Culture   Final    NO GROWTH 3 DAYS Performed at [facility], [address], [place], [place] [address]    Report Status PENDING  Incomplete  Blood Culture (routine x 2)     Status: None (Preliminary result)   Collection Time: 06/11/22  3:30 PM   Specimen: BLOOD  Result Value Ref Range Status   Specimen Description BLOOD BLOOD RIGHT ARM  Final   Special Requests   Final    BOTTLES DRAWN AEROBIC AND ANAEROBIC Blood Culture adequate volume   Culture   Final    NO GROWTH 3 DAYS Performed at [facility], [address] [place], [place] [address]    Report Status PENDING  Incomplete  Urine Culture     Status: None   Collection Time: 06/11/22  7:24 PM   Specimen: In/Out Cath Urine  Result Value Ref Range Status   Specimen Description   Final    IN/OUT CATH URINE Performed at [facility], [address] [place], [place] [address]    Special Requests   Final    NONE Performed at [facility], [address] [place], [place] [address]    Culture   Final    NO GROWTH Performed at [facility], [place] [address], [place], [place] [address]    Report Status 06/12/2022 FINAL  Final    Radiology Studies: No results found.  Scheduled Meds:  amphetamine-dextroamphetamine  30 mg Oral BID   aspirin EC  81 mg Oral Daily   enoxaparin (LOVENOX) injection  0.5 mg/kg Subcutaneous Q24H   insulin aspart  0-20 Units Subcutaneous TID WC   insulin glargine-yfgn  80 Units Subcutaneous BID   methadone  10 mg Oral Q8H   nicotine  14 mg Transdermal Daily   oxyCODONE  10 mg Oral Q4H   senna  1 tablet Oral BID   Continuous Infusions:  ceFEPime (MAXIPIME) IV 2 g (06/14/22 0817)     LOS: 3 days    Time spent: 50 mins    Kayse Puccini, MD [facility]   If 7PM-7AM, please contact night-coverage

## 2022-06-14 NOTE — Plan of Care (Signed)

## 2022-06-14 NOTE — Progress Notes (Signed)
CROSS COVER NOTE  NAME: Amy Meyers MRN: [id number] DOB : 1960-12-01    HPI/Events of Note   Hypoglycemic event  Assessment and  Interventions   Assessment: Able to eat but cbg still in 70s Plan: [place] NP [facility]

## 2022-06-15 DIAGNOSIS — A419 Sepsis, unspecified organism: Secondary | ICD-10-CM | POA: Diagnosis not present

## 2022-06-15 DIAGNOSIS — J9601 Acute respiratory failure with hypoxia: Secondary | ICD-10-CM | POA: Diagnosis not present

## 2022-06-15 DIAGNOSIS — R6520 Severe sepsis without septic shock: Secondary | ICD-10-CM | POA: Diagnosis not present

## 2022-06-15 LAB — GLUCOSE, CAPILLARY
Glucose-Capillary: 126 mg/dL — ABNORMAL HIGH (ref 70–99)
Glucose-Capillary: 116 mg/dL — ABNORMAL HIGH (ref 70–99)

## 2022-06-15 MED ORDER — CEPHALEXIN 500 MG PO CAPS: 500.0000 mg | ORAL_CAPSULE | Freq: Two times a day (BID) | ORAL | 0 refills | Status: AC

## 2022-06-15 NOTE — Evaluation (Signed)
Physical Therapy Evaluation & Discharge Patient Details Name: Amy Meyers MRN: [id number] DOB: 03/04/1961 Today's Date: 06/15/2022  History of Present Illness  62 y/o female presented to ED on 06/11/22 for confusion. Diagnosed with UTI x 1 week. Admitted for sepsis 2/2 UTI. PMH: chronic pain syndrome, T2DM, HTN  Clinical Impression  Patient admitted with the above. PTA, patient lives in 2nd floor apartment with daughter and reports independence. Patient functioning at modI level with no AD and reports chronic R hip pain. Able to negotiate stairs modI with rails to access home environment. Patient will have family support at discharge. No further skilled PT needs identified acutely. No PT follow up recommended at this time. PT will sign off.       Recommendations for follow up therapy are one component of a multi-disciplinary discharge planning process, led by the attending physician.  Recommendations may be updated based on patient status, additional functional criteria and insurance authorization.  Follow Up Recommendations No PT follow up      Assistance Recommended at Discharge PRN  Patient can return home with the following       Equipment Recommendations None recommended by PT  Recommendations for Other Services       Functional Status Assessment Patient has not had a recent decline in their functional status     Precautions / Restrictions Precautions Precautions: None Restrictions Weight Bearing Restrictions: No      Mobility  Bed Mobility Overal bed mobility: Modified Independent                  Transfers Overall transfer level: Modified independent Equipment used: None                    Ambulation/Gait Ambulation/Gait assistance: Modified independent (Device/Increase time) Gait Distance (Feet): 200 Feet Assistive device: None Gait Pattern/deviations: Step-through pattern, Decreased stride length Gait velocity: decreased         Stairs Stairs: Yes Stairs assistance: Modified independent (Device/Increase time) Stair Management: Two rails, Step to pattern, Forwards Number of Stairs: 5    Wheelchair Mobility    Modified Rankin (Stroke Patients Only)       Balance Overall balance assessment: Mild deficits observed, not formally tested                                           Pertinent Vitals/Pain Pain Assessment Pain Assessment: Faces Faces Pain Scale: Hurts little more Pain Location: R hip chronic Pain Descriptors / Indicators: Discomfort Pain Intervention(s): Monitored during session    Home Living Family/patient expects to be discharged to:: Private residence Living Arrangements: Children Available Help at Discharge: Family Type of Home: Apartment Home Access: Stairs to enter Entrance Stairs-Rails: Psychiatric nurse of Steps: flight   Home Layout: One level Home Equipment: None      Prior Function Prior Level of Function : Independent/Modified Independent                     Hand Dominance        Extremity/Trunk Assessment   Upper Extremity Assessment Upper Extremity Assessment: Overall WFL for tasks assessed    Lower Extremity Assessment Lower Extremity Assessment: Overall WFL for tasks assessed    Cervical / Trunk Assessment Cervical / Trunk Assessment: Normal  Communication   Communication: No difficulties  Cognition Arousal/Alertness: Awake/alert Behavior During Therapy: [facility] for  tasks assessed/performed Overall Cognitive Status: Within Functional Limits for tasks assessed                                 General Comments: daughter in room and reports at baseline        General Comments      Exercises     Assessment/Plan    PT Assessment Patient does not need any further PT services  PT Problem List         PT Treatment Interventions      PT Goals (Current goals can be found in the Care Plan  section)  Acute Rehab PT Goals Patient Stated Goal: to go home PT Goal Formulation: All assessment and education complete, DC therapy    Frequency       Co-evaluation               AM-PAC PT "6 Clicks" Mobility  Outcome Measure Help needed turning from your back to your side while in a flat bed without using bedrails?: None Help needed moving from lying on your back to sitting on the side of a flat bed without using bedrails?: None Help needed moving to and from a bed to a chair (including a wheelchair)?: None Help needed standing up from a chair using your arms (e.g., wheelchair or bedside chair)?: None Help needed to walk in hospital room?: None Help needed climbing 3-5 steps with a railing? : None 6 Click Score: 24    End of Session   Activity Tolerance: Patient tolerated treatment well Patient left: in bed;with call bell/phone within reach;with family/visitor present Nurse Communication: Mobility status PT Visit Diagnosis: Muscle weakness (generalized) (M62.81)    Time: 8657-8469 PT Time Calculation (min) (ACUTE ONLY): 15 min   Charges:   PT Evaluation $PT Eval Low Complexity: 1 Low          Asya Derryberry A. Gilford Rile PT, DPT [facility] - Acute Rehabilitation Services   Linna Hoff 06/15/2022, 12:36 PM

## 2022-06-15 NOTE — Progress Notes (Addendum)
  Transition of Care [facility]) Screening Note   Patient Details  Name: Amy Meyers Date of Birth: 04-20-1961   Transition of Care [facility]) CM/SW Contact:    Tiburcio Bash, LCSW Phone Number: 06/15/2022, 10:35 AM  Pending PT evals for recommendations. PCP Lelon Huh, on RA, from home.  Transition of Care Department [facility]) has reviewed patient and no TOC needs have been identified at this time. We will continue to monitor patient advancement through interdisciplinary progression rounds. If new patient transition needs arise, please place a TOC consult.  Kelby Fam, [place], MSW, [place]

## 2022-06-15 NOTE — Discharge Summary (Signed)
Physician Discharge Summary  Amy Meyers VQQ:[id number] DOB: 02/13/1961 DOA: 06/11/2022  PCP: Birdie Sons, MD  Admit date: 06/11/2022  Discharge date: 06/15/2022  Admitted From: Home.  Disposition:Home.  Recommendations for Outpatient Follow-up:  Follow up with PCP in 1-2 weeks. Please obtain BMP/CBC in one week. Advised to take Keflex 500 mg twice daily for 3 more days for UTI. Advised to follow-up with pain management for chronic pain management.  Home Health: None Equipment/Devices: None  Discharge Condition: Stable CODE STATUS: Full code Diet recommendation: Heart Healthy  Brief [facility] Course: This 62 y/o female with medical history significant of chronic pain syndrome, DM2, HTN reports a two week h/o coughing productive of thick bitter tasting mucus but not SOB, she also reports dysuria for a week or more. On inquiry she has h/o choking when she eats but denies any aspiration events over the past 48 hrs. Today her daughters noted she was somnolent and when aroused she was confused. EMS was activated: EMTs recorded a temperature of 103, hypoxemia. She was treated in the field with oxygen and was transported to [facility]ED for further evaluation.   01/14: Tmax 101.8 to 98.3,  108/60 HR 110 to 74,  RR 11. No signicant findings EDP exam. Lab: U/A - many bacteria and (+)nitrite, WBC6-10. CTA chest - no PE, mild bronchial wall thickening, plaque LLL c/w sequellae of aspiration vs infection, no infiltrate, no pulmonary edema., mild centrolobular emphysema. CT Abd/Pelv no acute findings. Patient treated with duoneb x 1, solumedrol 125 mg IV for wheezing, BiPAP for hypercarbia x 2-3 hrs now on [place] O2, 1 L LR bolus (had 1L bolus in the field). Abx started Vanc/Cefepime/Flagyl.  --> admitted for concern aspiration PNA +/- COPD, possible UTI. Narrow abx to cefepime, started steroids, nebs. Procal <0.10, lactate WNL, Patient was admitted for further evaluation.  She continued to get  improved.  Blood cultures no growth , urine cultures no growth.  Sepsis physiology resolved.  Patient ambulated without hypoxia.  She does not need oxygen at discharge.  Patient being discharged home on Keflex for 3 more days for UTI.  Discharge Diagnoses:  Principal Problem:   Sepsis ([place]) Active Problems:   Current tobacco use   Type 2 diabetes mellitus with microalbuminuria, with long-term current use of insulin (HCC)   Chronic pain syndrome   Emphysema lung (HCC)   Hypertension   Anxiety   ADD (attention deficit disorder) without hyperactivity  Sepsis likely multifactorial: Likely due to UTI, and aspiration pneumonia. Continue IV cefepime. Sepsis physiology improving.  Urine culture no growth, blood cultures no growth. Discharged on Keflex for 3 more days for UTI.   Acute hypoxic respiratory failure, likely multifactorial > Resolved. Could be due to pneumonia and ongoing COPD, tobacco use Smoking cessation, Nicotine patch - smokes 10 cigarettes per day Continue Prednisone taper: 40 mg in AM then taper down per symptoms Continue Duonebs q6 x 4 doses and re-evaluate. Continue Supplemental O2 as needed - wean as tolerated. She is weaned down to room air.  Does not require oxygen at discharge.   Anxiety disorder: Does not take anxiolytics at home. Contiue Xanax 0.5 mg q 6 prn anxiety.   Chronic pain syndrome: Patient reports  23 year h/o chronic pain starting when she had THR , right. She has been followed in a pain clinic by Dr. Primus Bravo until his retirement and she now has a new doctor. She was taking methadone up to 70 mg daily. Under her new clinician she  has had methadone reduced to '10mg'$  TID and has started oxycodone '10mg'$  q 4 hrs. Continue home regimen.    Attention deficit disorder: Continue Adderall 30 mg twice daily.   Hypertension: Patient does not take any blood pressure medications at home. Medication recommendations to be based on BP readings.   DM2 with  microalbuminuria: HbA1C 8.9 poorly controlled DM Continue Lantus 80 u BID Sliding scale coverage.   Discharge Instructions  Discharge Instructions     Call MD for:  difficulty breathing, headache or visual disturbances   Complete by: As directed    Call MD for:  persistant dizziness or light-headedness   Complete by: As directed    Call MD for:  persistant nausea and vomiting   Complete by: As directed    Diet - low sodium heart healthy   Complete by: As directed    Diet Carb Modified   Complete by: As directed    Discharge instructions   Complete by: As directed    Advised to follow-up with primary care physician in 1 week. Advised to take Keflex 500 mg twice daily for 3 more days for UTI. Advised to follow-up with pain management for chronic pain management.   Increase activity slowly   Complete by: As directed       Allergies as of 06/15/2022       Reactions   Meloxicam Itching, Swelling, Nausea And Vomiting   Sulfa Antibiotics Other (See Comments)        Medication List     STOP taking these medications    dapagliflozin propanediol 10 MG Tabs tablet Commonly known as: FARXIGA       TAKE these medications    Accu-Chek Aviva Plus test strip Generic drug: glucose blood Use to check blood sugar once a day   Accu-Chek Aviva Plus w/Device Kit Use to check blood sugar daily for type 2 diabetes. E11.9   amphetamine-dextroamphetamine 30 MG tablet Commonly known as: ADDERALL Take 1 tablet by mouth 2 (two) times daily. What changed: Another medication with the same name was removed. Continue taking this medication, and follow the directions you see here.   Aspirin 81 MG Caps Take 81 mg by mouth daily. Reported on 07/30/2015   blood glucose meter kit and supplies Kit Dispense based on patient and insurance preference. Use to monitor blood sugars once daily as directed.   cephALEXin 500 MG capsule Commonly known as: KEFLEX Take 1 capsule (500 mg total) by  mouth 2 (two) times daily for 3 days.   diclofenac Sodium 1 % Gel Commonly known as: VOLTAREN Apply 2-4 g topically 4 (four) times daily.   gabapentin 400 MG capsule Commonly known as: NEURONTIN Take 400 mg by mouth at bedtime as needed.   Insulin Syringe 27G X 1/2" 1 ML Misc 1 Units by Does not apply route daily. For insulin dependent type 2 diabetes   Lantus SoloStar 100 UNIT/ML Solostar Pen Generic drug: insulin glargine INJECT 80 UNITS INTO THE SKIN 2 (TWO) TIMES DAILY. OFFICE VISIT NEEDED FOR ADDITIONAL REFILLS   methadone 10 MG tablet Commonly known as: DOLOPHINE Limit 5-7 tabs by mouth per day if tolerated What changed:  how much to take how to take this when to take this   Narcan 4 MG/0.1ML Liqd nasal spray kit Generic drug: naloxone   Oxycodone HCl 10 MG Tabs Take 10 mg by mouth every 4 (four) hours. What changed: Another medication with the same name was removed. Continue taking this medication, and follow  the directions you see here.        Follow-up Information     Birdie Sons, MD Follow up in 1 week(s).   Specialty: Family Medicine Why: 06/21/22 at 2:40 PM Contact information: [address] [place] [address] [phone]                Allergies  Allergen Reactions   Meloxicam Itching, Swelling and Nausea And Vomiting   Sulfa Antibiotics Other (See Comments)    Consultations: None   Procedures/Studies: CT Angio Chest PE W and/or Wo Contrast  Result Date: 06/11/2022 CLINICAL DATA:  Pulmonary embolism (PE) suspected, high prob; Abdominal pain, acute, nonlocalized EXAM: CT ANGIOGRAPHY CHEST CT ABDOMEN AND PELVIS WITH CONTRAST TECHNIQUE: Multidetector CT imaging of the chest was performed using the standard protocol during bolus administration of intravenous contrast. Multiplanar CT image reconstructions and MIPs were obtained to evaluate the vascular anatomy. Multidetector CT imaging of the abdomen and pelvis was  performed using the standard protocol during bolus administration of intravenous contrast. RADIATION DOSE REDUCTION: This exam was performed according to the departmental dose-optimization program which includes automated exposure control, adjustment of the mA and/or kV according to patient size and/or use of iterative reconstruction technique. CONTRAST:  175m OMNIPAQUE IOHEXOL 350 MG/ML SOLN COMPARISON:  February 29, 2020. February 06, 2012 FINDINGS: CTA CHEST FINDINGS Cardiovascular: Satisfactory opacification of the pulmonary arteries to the segmental level. No evidence of pulmonary embolism. Cardiomegaly. No pericardial effusion. Three-vessel coronary artery atherosclerotic calcifications. Scattered atherosclerotic calcifications of the nonaneurysmal thoracic aorta. Mild enlargement of the pulmonary artery in relation to the ascending thoracic aorta. Mediastinum/Nodes: Visualized thyroid is unremarkable. No axillary adenopathy. Prominent pre aortic lymph node measuring 10 mm in the short axis (series 10, image 126). Lungs/Pleura: No pleural effusion or pneumothorax. Mild bronchial wall thickening with endobronchial debris predominately within the LEFT lower lobe. There are downstream scattered hypoenhancing platelike opacities within the LEFT lower lobe. Mild centrilobular emphysema. There are scattered pulmonary nodules with representative nodules as follows: Nodule 1: RIGHT upper lobe pulmonary nodule measures 2 mm (series 10, image 157). Nodule 2: LEFT upper lobe pulmonary nodule measures 4 mm (series 10, image 142). Musculoskeletal: No chest wall abnormality. No acute or significant osseous findings. Review of the MIP images confirms the above findings. CT ABDOMEN and PELVIS FINDINGS Hepatobiliary: Status post cholecystectomy. Mild dilation of the common bile duct to approximately 11 mm, likely due to post cholecystectomy state. Hepatomegaly. Pancreas: Unremarkable. No pancreatic ductal dilatation or  surrounding inflammatory changes. Spleen: Normal in size without focal abnormality. Adrenals/Urinary Tract: 15 mm RIGHT adrenal adenoma, stable since at least 2013 and consistent with a benign etiology (for which no dedicated imaging follow-up is recommended). LEFT adrenal gland is unremarkable. Kidneys enhance symmetrically. No hydronephrosis. No obstructing nephrolithiasis. Bladder is unremarkable. Stomach/Bowel: No evidence of bowel obstruction. Moderate to large colonic stool burden predominately within the RIGHT hemicolon. Small hiatal hernia. No ancillary evidence of acute appendicitis. Vascular/Lymphatic: Atherosclerotic calcifications of the nonaneurysmal abdominal aorta. No new suspicious lymphadenopathy. Reproductive: Status post hysterectomy. No adnexal masses. Other: Small fat containing ventral hernia of the RIGHT abdomen. Musculoskeletal: Status post RIGHT hip arthroplasty. Advanced degenerative changes of the LEFT hip. Review of the MIP images confirms the above findings. IMPRESSION: 1. No evidence of pulmonary embolism. 2. Mild bronchial wall thickening with endobronchial debris predominately within the LEFT lower lobe. There are downstream scattered hypoenhancing platelike opacities within the LEFT lower lobe. Findings could reflect sequela of aspiration or infection.  3. Scattered pulmonary nodules measuring up to 4 mm. No follow-up needed if patient is low-risk (and has no known or suspected primary neoplasm). Non-contrast chest CT can be considered in 12 months if patient is high-risk. This recommendation follows the consensus statement: Guidelines for Management of Incidental Pulmonary Nodules Detected on CT Images: From the Fleischner Society 2017; Radiology 2017; 284:228-243. 4. Moderate to large colonic stool burden predominately within the RIGHT hemicolon. 5. Mild enlargement of the pulmonary artery in relation to the ascending thoracic aorta. This can be seen in the setting of pulmonary  arterial hypertension. 6. Small hiatal hernia. Aortic Atherosclerosis (ICD10-I70.0) and Emphysema (ICD10-J43.9). Electronically Signed   By: Valentino Saxon M.D.   On: 06/11/2022 19:31   CT ABDOMEN PELVIS W CONTRAST  Result Date: 06/11/2022 CLINICAL DATA:  Pulmonary embolism (PE) suspected, high prob; Abdominal pain, acute, nonlocalized EXAM: CT ANGIOGRAPHY CHEST CT ABDOMEN AND PELVIS WITH CONTRAST TECHNIQUE: Multidetector CT imaging of the chest was performed using the standard protocol during bolus administration of intravenous contrast. Multiplanar CT image reconstructions and MIPs were obtained to evaluate the vascular anatomy. Multidetector CT imaging of the abdomen and pelvis was performed using the standard protocol during bolus administration of intravenous contrast. RADIATION DOSE REDUCTION: This exam was performed according to the departmental dose-optimization program which includes automated exposure control, adjustment of the mA and/or kV according to patient size and/or use of iterative reconstruction technique. CONTRAST:  110m OMNIPAQUE IOHEXOL 350 MG/ML SOLN COMPARISON:  February 29, 2020. February 06, 2012 FINDINGS: CTA CHEST FINDINGS Cardiovascular: Satisfactory opacification of the pulmonary arteries to the segmental level. No evidence of pulmonary embolism. Cardiomegaly. No pericardial effusion. Three-vessel coronary artery atherosclerotic calcifications. Scattered atherosclerotic calcifications of the nonaneurysmal thoracic aorta. Mild enlargement of the pulmonary artery in relation to the ascending thoracic aorta. Mediastinum/Nodes: Visualized thyroid is unremarkable. No axillary adenopathy. Prominent pre aortic lymph node measuring 10 mm in the short axis (series 10, image 126). Lungs/Pleura: No pleural effusion or pneumothorax. Mild bronchial wall thickening with endobronchial debris predominately within the LEFT lower lobe. There are downstream scattered hypoenhancing platelike  opacities within the LEFT lower lobe. Mild centrilobular emphysema. There are scattered pulmonary nodules with representative nodules as follows: Nodule 1: RIGHT upper lobe pulmonary nodule measures 2 mm (series 10, image 157). Nodule 2: LEFT upper lobe pulmonary nodule measures 4 mm (series 10, image 142). Musculoskeletal: No chest wall abnormality. No acute or significant osseous findings. Review of the MIP images confirms the above findings. CT ABDOMEN and PELVIS FINDINGS Hepatobiliary: Status post cholecystectomy. Mild dilation of the common bile duct to approximately 11 mm, likely due to post cholecystectomy state. Hepatomegaly. Pancreas: Unremarkable. No pancreatic ductal dilatation or surrounding inflammatory changes. Spleen: Normal in size without focal abnormality. Adrenals/Urinary Tract: 15 mm RIGHT adrenal adenoma, stable since at least 2013 and consistent with a benign etiology (for which no dedicated imaging follow-up is recommended). LEFT adrenal gland is unremarkable. Kidneys enhance symmetrically. No hydronephrosis. No obstructing nephrolithiasis. Bladder is unremarkable. Stomach/Bowel: No evidence of bowel obstruction. Moderate to large colonic stool burden predominately within the RIGHT hemicolon. Small hiatal hernia. No ancillary evidence of acute appendicitis. Vascular/Lymphatic: Atherosclerotic calcifications of the nonaneurysmal abdominal aorta. No new suspicious lymphadenopathy. Reproductive: Status post hysterectomy. No adnexal masses. Other: Small fat containing ventral hernia of the RIGHT abdomen. Musculoskeletal: Status post RIGHT hip arthroplasty. Advanced degenerative changes of the LEFT hip. Review of the MIP images confirms the above findings. IMPRESSION: 1. No evidence of pulmonary embolism. 2. Mild bronchial  wall thickening with endobronchial debris predominately within the LEFT lower lobe. There are downstream scattered hypoenhancing platelike opacities within the LEFT lower lobe.  Findings could reflect sequela of aspiration or infection. 3. Scattered pulmonary nodules measuring up to 4 mm. No follow-up needed if patient is low-risk (and has no known or suspected primary neoplasm). Non-contrast chest CT can be considered in 12 months if patient is high-risk. This recommendation follows the consensus statement: Guidelines for Management of Incidental Pulmonary Nodules Detected on CT Images: From the Fleischner Society 2017; Radiology 2017; 284:228-243. 4. Moderate to large colonic stool burden predominately within the RIGHT hemicolon. 5. Mild enlargement of the pulmonary artery in relation to the ascending thoracic aorta. This can be seen in the setting of pulmonary arterial hypertension. 6. Small hiatal hernia. Aortic Atherosclerosis (ICD10-I70.0) and Emphysema (ICD10-J43.9). Electronically Signed   By: Valentino Saxon M.D.   On: 06/11/2022 19:31   CT HEAD WO CONTRAST (5MM)  Result Date: 06/11/2022 CLINICAL DATA:  Headache, fever, confusion, urinary tract infection EXAM: CT HEAD WITHOUT CONTRAST TECHNIQUE: Contiguous axial images were obtained from the base of the skull through the vertex without intravenous contrast. RADIATION DOSE REDUCTION: This exam was performed according to the departmental dose-optimization program which includes automated exposure control, adjustment of the mA and/or kV according to patient size and/or use of iterative reconstruction technique. COMPARISON:  06/21/2017 FINDINGS: Brain: Hypodensities within the periventricular white matter, bilateral basal ganglia, and right cerebellar hemisphere are consistent with chronic small vessel ischemic changes. No acute infarct or hemorrhage. Lateral ventricles and remaining midline structures are unremarkable. No acute extra-axial fluid collections. No mass effect. Vascular: No hyperdense vessel or unexpected calcification. Skull: Normal. Negative for fracture or focal lesion. Sinuses/Orbits: No acute finding. Other:  None. IMPRESSION: 1. No acute intracranial process. Chronic small vessel ischemic changes as above. Electronically Signed   By: Randa Ngo M.D.   On: 06/11/2022 19:16   DG Chest Port 1 View  Result Date: 06/11/2022 CLINICAL DATA:  Questionable sepsis - evaluate for abnormality EXAM: PORTABLE CHEST 1 VIEW COMPARISON:  November 05, 2021 FINDINGS: The cardiomediastinal silhouette is unchanged in contour. No pleural effusion. No pneumothorax. Increased reticulonodular markings of bilateral bases with prominent bronchial wall markings. IMPRESSION: Increased reticulonodular markings of bilateral bases. Differential considerations include pulmonary edema versus atypical infection. Electronically Signed   By: Valentino Saxon M.D.   On: 06/11/2022 15:48    Subjective: Patient seen and examined at bedside.  Overnight events noted.   Patient reports doing better and wants to be discharged.  Patient discharged home,  Discharge Exam: Vitals:   06/15/22 0453 06/15/22 0854  BP: 131/65 (!) 157/69  Pulse: 76 70  Resp: 16 19  Temp: 98 F (36.7 C) 98.1 F (36.7 C)  SpO2: 94% 96%   Vitals:   06/14/22 1527 06/14/22 2034 06/15/22 0453 06/15/22 0854  BP: (!) 144/64 (!) 158/72 131/65 (!) 157/69  Pulse: 74 69 76 70  Resp: '18 16 16 19  '$ Temp: 98.5 F (36.9 C) 98.4 F (36.9 C) 98 F (36.7 C) 98.1 F (36.7 C)  TempSrc: Oral Oral Oral   SpO2: 95% 97% 94% 96%  Weight:      Height:        General: Pt is alert, awake, not in acute distress Cardiovascular: RRR, S1/S2 +, no rubs, no gallops Respiratory: CTA bilaterally, no wheezing, no rhonchi Abdominal: Soft, NT, ND, bowel sounds + Extremities: no edema, no cyanosis    The results of significant diagnostics  from this hospitalization (including imaging, microbiology, ancillary and laboratory) are listed below for reference.     Microbiology: Recent Results (from the past 240 hour(s))  Resp panel by RT-PCR (RSV, Flu A&B, Covid) Anterior Nasal Swab      Status: None   Collection Time: 06/11/22  3:30 PM   Specimen: Anterior Nasal Swab  Result Value Ref Range Status   SARS Coronavirus 2 by RT PCR NEGATIVE NEGATIVE Final    Comment: (NOTE) SARS-CoV-2 target nucleic acids are NOT DETECTED.  The SARS-CoV-2 RNA is generally detectable in upper respiratory specimens during the acute phase of infection. The lowest concentration of SARS-CoV-2 viral copies this assay can detect is 138 copies/mL. A negative result does not preclude SARS-Cov-2 infection and should not be used as the sole basis for treatment or other patient management decisions. A negative result may occur with  improper specimen collection/handling, submission of specimen other than nasopharyngeal swab, presence of viral mutation(s) within the areas targeted by this assay, and inadequate number of viral copies(<138 copies/mL). A negative result must be combined with clinical observations, patient history, and epidemiological information. The expected result is Negative.  Fact Sheet for Patients:  EntrepreneurPulse.com.au  Fact Sheet for Healthcare Providers:  IncredibleEmployment.be  This test is no t yet approved or cleared by the [place] FDA and  has been authorized for detection and/or diagnosis of SARS-CoV-2 by FDA under an Emergency Use Authorization (EUA). This EUA will remain  in effect (meaning this test can be used) for the duration of the COVID-19 declaration under Section 564(b)(1) of the Act, 21 U.S.C.section 360bbb-3(b)(1), unless the authorization is terminated  or revoked sooner.       Influenza A by PCR NEGATIVE NEGATIVE Final   Influenza B by PCR NEGATIVE NEGATIVE Final    Comment: (NOTE) The Xpert Xpress SARS-CoV-2/FLU/RSV plus assay is intended as an aid in the diagnosis of influenza from Nasopharyngeal swab specimens and should not be used as a sole basis for treatment. Nasal washings and aspirates are  unacceptable for Xpert Xpress SARS-CoV-2/FLU/RSV testing.  Fact Sheet for Patients: EntrepreneurPulse.com.au  Fact Sheet for Healthcare Providers: IncredibleEmployment.be  This test is not yet approved or cleared by the [place] FDA and has been authorized for detection and/or diagnosis of SARS-CoV-2 by FDA under an Emergency Use Authorization (EUA). This EUA will remain in effect (meaning this test can be used) for the duration of the COVID-19 declaration under Section 564(b)(1) of the Act, 21 U.S.C. section 360bbb-3(b)(1), unless the authorization is terminated or revoked.     Resp Syncytial Virus by PCR NEGATIVE NEGATIVE Final    Comment: (NOTE) Fact Sheet for Patients: EntrepreneurPulse.com.au  Fact Sheet for Healthcare Providers: IncredibleEmployment.be  This test is not yet approved or cleared by the [place] FDA and has been authorized for detection and/or diagnosis of SARS-CoV-2 by FDA under an Emergency Use Authorization (EUA). This EUA will remain in effect (meaning this test can be used) for the duration of the COVID-19 declaration under Section 564(b)(1) of the Act, 21 U.S.C. section 360bbb-3(b)(1), unless the authorization is terminated or revoked.  Performed at [facility], [address] [place], [place] [address]   Blood Culture (routine x 2)     Status: None (Preliminary result)   Collection Time: 06/11/22  3:30 PM   Specimen: BLOOD  Result Value Ref Range Status   Specimen Description BLOOD BLOOD RIGHT HAND  Final   Special Requests   Final    BOTTLES DRAWN  AEROBIC AND ANAEROBIC Blood Culture adequate volume   Culture   Final    NO GROWTH 4 DAYS Performed at [facility], [address] [place], [place] [address]    Report Status PENDING  Incomplete  Blood Culture (routine x 2)     Status: None (Preliminary result)   Collection Time: 06/11/22  3:30  PM   Specimen: BLOOD  Result Value Ref Range Status   Specimen Description BLOOD BLOOD RIGHT ARM  Final   Special Requests   Final    BOTTLES DRAWN AEROBIC AND ANAEROBIC Blood Culture adequate volume   Culture   Final    NO GROWTH 4 DAYS Performed at [facility], [address], [place], [place] [address]    Report Status PENDING  Incomplete  Urine Culture     Status: None   Collection Time: 06/11/22  7:24 PM   Specimen: In/Out Cath Urine  Result Value Ref Range Status   Specimen Description   Final    IN/OUT CATH URINE Performed at [facility], [address] [place], [place] [address]    Special Requests   Final    NONE Performed at [facility], [address], [place], [place] [address]    Culture   Final    NO GROWTH Performed at [facility], [address], [place], [place] [address]    Report Status 06/12/2022 FINAL  Final     Labs: BNP (last 3 results) Recent Labs    06/11/22 1530  BNP 086.7*   Basic Metabolic Panel: Recent Labs  Lab 06/11/22 1530 06/12/22 0432 06/13/22 0502  NA 135 133* 136  K 4.5 4.2 4.0  CL 97* 98 101  CO2 '31 27 30  '$ GLUCOSE 101* 378* 275*  BUN '16 19 17  '$ CREATININE 0.73 0.88 0.76  CALCIUM 8.2* 8.0* 8.1*   Liver Function Tests: Recent Labs  Lab 06/11/22 1530  AST 15  ALT 17  ALKPHOS 65  BILITOT 0.5  PROT 7.6  ALBUMIN 3.4*   Recent Labs  Lab 06/11/22 1530  LIPASE 24   No results for input(s): "AMMONIA" in the last 168 hours. CBC: Recent Labs  Lab 06/11/22 1530 06/12/22 0432  WBC 10.7* 8.9  NEUTROABS 6.5 7.2  HGB 14.0 12.4  HCT 44.3 38.6  MCV 86.0 84.3  PLT 182 165   Cardiac Enzymes: No results for input(s): "CKTOTAL", "CKMB", "CKMBINDEX", "TROPONINI" in the last 168 hours. BNP: Invalid input(s): "POCBNP" CBG: Recent Labs  Lab 06/14/22 2037 06/14/22 2101 06/14/22 2201 06/15/22 1025 06/15/22 1142  GLUCAP 65* 71 75 126* 116*   D-Dimer No results for input(s):  "DDIMER" in the last 72 hours. Hgb A1c No results for input(s): "HGBA1C" in the last 72 hours. Lipid Profile No results for input(s): "CHOL", "HDL", "LDLCALC", "TRIG", "CHOLHDL", "LDLDIRECT" in the last 72 hours. Thyroid function studies No results for input(s): "TSH", "T4TOTAL", "T3FREE", "THYROIDAB" in the last 72 hours.  Invalid input(s): "FREET3" Anemia work up No results for input(s): "VITAMINB12", "FOLATE", "FERRITIN", "TIBC", "IRON", "RETICCTPCT" in the last 72 hours. Urinalysis    Component Value Date/Time   COLORURINE YELLOW (A) 06/11/2022 1924   APPEARANCEUR HAZY (A) 06/11/2022 1924   APPEARANCEUR Clear 11/17/2013 1155   LABSPEC 1.019 06/11/2022 1924   LABSPEC 1.006 11/17/2013 1155   PHURINE 5.0 06/11/2022 1924   GLUCOSEU NEGATIVE 06/11/2022 1924   GLUCOSEU Negative 11/17/2013 1155   HGBUR SMALL (A) 06/11/2022 [place] NEGATIVE 06/11/2022 1924  BILIRUBINUR Neg 09/28/2017 1207   BILIRUBINUR Negative 11/17/2013 1155   KETONESUR NEGATIVE 06/11/2022 1924   PROTEINUR 100 (A) 06/11/2022 1924   UROBILINOGEN 0.2 09/28/2017 1207   NITRITE POSITIVE (A) 06/11/2022 1924   LEUKOCYTESUR TRACE (A) 06/11/2022 1924   LEUKOCYTESUR Trace 11/17/2013 1155   Sepsis Labs Recent Labs  Lab 06/11/22 1530 06/12/22 0432  WBC 10.7* 8.9   Microbiology Recent Results (from the past 240 hour(s))  Resp panel by RT-PCR (RSV, Flu A&B, Covid) Anterior Nasal Swab     Status: None   Collection Time: 06/11/22  3:30 PM   Specimen: Anterior Nasal Swab  Result Value Ref Range Status   SARS Coronavirus 2 by RT PCR NEGATIVE NEGATIVE Final    Comment: (NOTE) SARS-CoV-2 target nucleic acids are NOT DETECTED.  The SARS-CoV-2 RNA is generally detectable in upper respiratory specimens during the acute phase of infection. The lowest concentration of SARS-CoV-2 viral copies this assay can detect is 138 copies/mL. A negative result does not preclude SARS-Cov-2 infection and should not be used  as the sole basis for treatment or other patient management decisions. A negative result may occur with  improper specimen collection/handling, submission of specimen other than nasopharyngeal swab, presence of viral mutation(s) within the areas targeted by this assay, and inadequate number of viral copies(<138 copies/mL). A negative result must be combined with clinical observations, patient history, and epidemiological information. The expected result is Negative.  Fact Sheet for Patients:  EntrepreneurPulse.com.au  Fact Sheet for Healthcare Providers:  IncredibleEmployment.be  This test is no t yet approved or cleared by the [place] FDA and  has been authorized for detection and/or diagnosis of SARS-CoV-2 by FDA under an Emergency Use Authorization (EUA). This EUA will remain  in effect (meaning this test can be used) for the duration of the COVID-19 declaration under Section 564(b)(1) of the Act, 21 U.S.C.section 360bbb-3(b)(1), unless the authorization is terminated  or revoked sooner.       Influenza A by PCR NEGATIVE NEGATIVE Final   Influenza B by PCR NEGATIVE NEGATIVE Final    Comment: (NOTE) The Xpert Xpress SARS-CoV-2/FLU/RSV plus assay is intended as an aid in the diagnosis of influenza from Nasopharyngeal swab specimens and should not be used as a sole basis for treatment. Nasal washings and aspirates are unacceptable for Xpert Xpress SARS-CoV-2/FLU/RSV testing.  Fact Sheet for Patients: EntrepreneurPulse.com.au  Fact Sheet for Healthcare Providers: IncredibleEmployment.be  This test is not yet approved or cleared by the [place] FDA and has been authorized for detection and/or diagnosis of SARS-CoV-2 by FDA under an Emergency Use Authorization (EUA). This EUA will remain in effect (meaning this test can be used) for the duration of the COVID-19 declaration under Section 564(b)(1)  of the Act, 21 U.S.C. section 360bbb-3(b)(1), unless the authorization is terminated or revoked.     Resp Syncytial Virus by PCR NEGATIVE NEGATIVE Final    Comment: (NOTE) Fact Sheet for Patients: EntrepreneurPulse.com.au  Fact Sheet for Healthcare Providers: IncredibleEmployment.be  This test is not yet approved or cleared by the [place] FDA and has been authorized for detection and/or diagnosis of SARS-CoV-2 by FDA under an Emergency Use Authorization (EUA). This EUA will remain in effect (meaning this test can be used) for the duration of the COVID-19 declaration under Section 564(b)(1) of the Act, 21 U.S.C. section 360bbb-3(b)(1), unless the authorization is terminated or revoked.  Performed at [facility], [address] [place], [place] [address]   Blood Culture (routine x  2)     Status: None (Preliminary result)   Collection Time: 06/11/22  3:30 PM   Specimen: BLOOD  Result Value Ref Range Status   Specimen Description BLOOD BLOOD RIGHT HAND  Final   Special Requests   Final    BOTTLES DRAWN AEROBIC AND ANAEROBIC Blood Culture adequate volume   Culture   Final    NO GROWTH 4 DAYS Performed at [facility], [address], [place], [place] [address]    Report Status PENDING  Incomplete  Blood Culture (routine x 2)     Status: None (Preliminary result)   Collection Time: 06/11/22  3:30 PM   Specimen: BLOOD  Result Value Ref Range Status   Specimen Description BLOOD BLOOD RIGHT ARM  Final   Special Requests   Final    BOTTLES DRAWN AEROBIC AND ANAEROBIC Blood Culture adequate volume   Culture   Final    NO GROWTH 4 DAYS Performed at [facility], [address] [place], [place] [address]    Report Status PENDING  Incomplete  Urine Culture     Status: None   Collection Time: 06/11/22  7:24 PM   Specimen: In/Out Cath Urine  Result Value Ref Range Status   Specimen Description   Final     IN/OUT CATH URINE Performed at [facility], [address], [place], [place] [address]    Special Requests   Final    NONE Performed at [facility], [address] [place], [place] [address]    Culture   Final    NO GROWTH Performed at [facility], [place] [address], [place], [place] [address]    Report Status 06/12/2022 FINAL  Final     Time coordinating discharge: Over 30 minutes  SIGNED:   Shawna Clamp, MD  [facility] 06/15/2022, 4:59 PM Pager   If 7PM-7AM, please contact night-coverage

## 2022-06-15 NOTE — Inpatient Diabetes Management (Signed)
Inpatient Diabetes Program Recommendations  AACE/ADA: New Consensus Statement on Inpatient Glycemic Control (2015)  Target Ranges:  Prepandial:   less than 140 mg/dL      Peak postprandial:   less than 180 mg/dL (1-2 hours)      Critically ill patients:  140 - 180 mg/dL   Lab Results  Component Value Date   GLUCAP 126 (H) 06/15/2022   HGBA1C 8.9 (H) 06/12/2022    Review of Glycemic Control  Latest Reference Range & Units 06/14/22 07:36 06/14/22 11:12 06/14/22 16:28 06/14/22 20:37 06/14/22 21:01 06/14/22 22:01 06/15/22 10:25  Glucose-Capillary 70 - 99 mg/dL 156 (H) 108 (H) 166 (H) 65 (L) 71 75 126 (H)  (H): Data is abnormally high (L): Data is abnormally low  Diabetes history: DM2 Outpatient Diabetes medications: Lantus 80 units BID, Farxiga 10 mg daily (not taking)  Current orders for Inpatient glycemic control: Semglee 80 units BID, Novolog 0-20 units TID with meals  Inpatient Diabetes Program Recommendations:    Mild hypoglycemia last evening.  Please decrease correction to:  Novolog 0-15 units TID  Will continue to follow while inpatient.  Thank you, Reche Dixon, MSN, [place] Diabetes Coordinator Inpatient Diabetes Program [phone] (team pager from 8a-5p)

## 2022-06-16 ENCOUNTER — Telehealth: Payer: Self-pay

## 2022-06-16 LAB — CULTURE, BLOOD (ROUTINE X 2)
Culture: NO GROWTH
Culture: NO GROWTH
Special Requests: ADEQUATE
Special Requests: ADEQUATE

## 2022-06-16 NOTE — Patient Outreach (Signed)
  Care Coordination TOC Note Transition Care Management Follow-up Telephone Call Date of discharge and from where: [facility] 06/15/22 How have you been since you were released from the hospital? "I am doing alright, feeling better" Any questions or concerns? No  Items Reviewed: Did the pt receive and understand the discharge instructions provided? Yes  Medications obtained and verified? Yes  Other? No  Any new allergies since your discharge? No  Dietary orders reviewed? No Do you have support at home? Yes   Home Care and Equipment/Supplies: Were home health services ordered? no If so, what is the name of the agency? N/a  Has the agency set up a time to come to the patient's home? not applicable Were any new equipment or medical supplies ordered?  No What is the name of the medical supply agency? N/a Were you able to get the supplies/equipment? not applicable Do you have any questions related to the use of the equipment or supplies? No  Functional Questionnaire: (I = Independent and D = Dependent) ADLs: I  Bathing/Dressing- I  Meal Prep- I  Eating- I  Maintaining continence- I  Transferring/Ambulation- I  Managing Meds- I  Follow up appointments reviewed:  PCP Hospital f/u appt confirmed? Yes  Scheduled to see Dr. Caryn Section on 06/21/22 @ 2:40. [place] [facility] f/u appt confirmed? No   Are transportation arrangements needed? No  If their condition worsens, is the pt aware to call PCP or go to the Emergency Dept.? Yes Was the patient provided with contact information for the PCP's office or ED? Yes Was to pt encouraged to call back with questions or concerns? Yes  SDOH assessments and interventions completed:   Yes SDOH Interventions Today    Flowsheet Row Most Recent Value  SDOH Interventions   Housing Interventions Intervention Not Indicated  Transportation Interventions Intervention Not Indicated       Care Coordination Interventions:  No Care Coordination  interventions needed at this time.   Encounter Outcome:  Pt. Visit Completed

## 2022-06-23 ENCOUNTER — Inpatient Hospital Stay: Payer: Medicare Other | Admitting: Family Medicine

## 2022-06-26 ENCOUNTER — Other Ambulatory Visit: Payer: Self-pay | Admitting: Family Medicine

## 2022-06-26 DIAGNOSIS — Z794 Long term (current) use of insulin: Secondary | ICD-10-CM

## 2022-06-26 NOTE — Telephone Encounter (Signed)
Medication Refill - Medication: Lantis 80 mg bid  Has the patient contacted their pharmacy? Yes.   (Agent: If no, request that the patient contact the pharmacy for the refill. If patient does not wish to contact the pharmacy document the reason why and proceed with request.) (Agent: If yes, when and what did the pharmacy advise?)  Preferred Pharmacy (with phone number or street name): [facility] Has the patient been seen for an appointment in the last year OR does the patient have an upcoming appointment? Yes.    Agent: Please be advised that RX refills may take up to 3 business days. We ask that you follow-up with your pharmacy.

## 2022-06-27 ENCOUNTER — Other Ambulatory Visit: Payer: Self-pay | Admitting: Family Medicine

## 2022-06-27 ENCOUNTER — Telehealth: Payer: Self-pay

## 2022-06-27 DIAGNOSIS — E1165 Type 2 diabetes mellitus with hyperglycemia: Secondary | ICD-10-CM

## 2022-06-27 NOTE — Telephone Encounter (Signed)
Requested medication (s) are due for refill today:   Yes  Requested medication (s) are on the active medication list:   Yes  Future visit scheduled:   Yes 06/29/2022 with Dr. Rosana Berger   Last ordered: 06/08/2022 15 ml, 0 refills  Returned for provider review prior to upcoming appt since protocol indicates no valid encounter within the last 6 months.   Requested Prescriptions  Pending Prescriptions Disp Refills   insulin glargine (LANTUS SOLOSTAR) 100 UNIT/ML Solostar Pen 15 mL 0     Endocrinology:  Diabetes - Insulins Failed - 06/26/2022  1:14 PM      Failed - HBA1C is between 0 and 7.9 and within 180 days    Hgb A1c MFr Bld  Date Value Ref Range Status  06/12/2022 8.9 (H) 4.8 - 5.6 % Final    Comment:    (NOTE) Pre diabetes:          5.7%-6.4%  Diabetes:              >6.4%  Glycemic control for   <7.0% adults with diabetes          Failed - Valid encounter within last 6 months    Recent Outpatient Visits           9 months ago Diabetes mellitus without complication ([place])   [place], [place], [name]   1 year ago Type 2 diabetes mellitus with hyperglycemia, with long-term current use of insulin ([place])   [place], Erin E, [name]   3 years ago Type 2 diabetes mellitus with diabetic neuropathy, with long-term current use of insulin ([place])   [place], Donald E, MD   4 years ago Type 2 diabetes mellitus with diabetic neuropathy, with long-term current use of insulin ([place])   [place], Donald E, MD   5 years ago Type 2 diabetes mellitus with diabetic neuropathy, with long-term current use of insulin ([place])   [place], Donald E, MD       Future Appointments             In 2 days Teodora Medici, [facility], [facility]

## 2022-06-27 NOTE — Telephone Encounter (Signed)
Copied from [place] [id number]. Topic: Conservator, museum/gallery Patient (Clinic Use ONLY) >> Jun 27, 2022  2:41 PM Josue Hector wrote: Reason for CRM: Called patient to inform her the appointment she had on 06/29/2022 @ 10:40 was scheduled with the wrong office and that she would need to contact [facility] in order to scheduled.    *Will route CRM to [facility] to schedule patient in case she doesn't call them first.

## 2022-06-28 ENCOUNTER — Other Ambulatory Visit: Payer: Self-pay | Admitting: Family Medicine

## 2022-06-28 DIAGNOSIS — E1165 Type 2 diabetes mellitus with hyperglycemia: Secondary | ICD-10-CM

## 2022-06-28 MED ORDER — LANTUS SOLOSTAR 100 UNIT/ML ~~LOC~~ SOPN: PEN_INJECTOR | SUBCUTANEOUS | 0 refills | Status: DC

## 2022-06-28 NOTE — Telephone Encounter (Signed)
Requested medication (s) are due for refill today: For review  Requested medication (s) are on the active medication list: yes    Last refill: 06/08/22  41m  0 refills  Future visit scheduled yes 07/07/22  Notes to clinic:Please review, pt has several cancelled appts, needs upcoming appt for refills.  Requested Prescriptions  Pending Prescriptions Disp Refills   LANTUS SOLOSTAR 100 UNIT/ML Solostar Pen [Pharmacy Med Name: LANTUS SOLOSTAR 100 UNIT/ML]      Sig: INJECT 80 UNITS INTO THE SKIN 2 (TWO) TIMES DAILY. OFFICE VISIT NEEDED FOR ADDITIONAL REFILLS     Endocrinology:  Diabetes - Insulins Failed - 06/27/2022 11:44 AM      Failed - HBA1C is between 0 and 7.9 and within 180 days    Hgb A1c MFr Bld  Date Value Ref Range Status  06/12/2022 8.9 (H) 4.8 - 5.6 % Final    Comment:    (NOTE) Pre diabetes:          5.7%-6.4%  Diabetes:              >6.4%  Glycemic control for   <7.0% adults with diabetes          Failed - Valid encounter within last 6 months    Recent Outpatient Visits           9 months ago Diabetes mellitus without complication ([place]   [place] [name]   1 year ago Type 2 diabetes mellitus with hyperglycemia, with long-term current use of insulin ([place]   [place] Erin E, [name]   3 years ago Type 2 diabetes mellitus with diabetic neuropathy, with long-term current use of insulin ([place]   [place] E, MD   4 years ago Type 2 diabetes mellitus with diabetic neuropathy, with long-term current use of insulin ([place]   [place] Donald E, MD   5 years ago Type 2 diabetes mellitus with diabetic neuropathy, with long-term current use of insulin ([place]   [place] MD       Future Appointments             In 1 week Fisher, [facility]irstie Peri [facility]

## 2022-06-28 NOTE — Telephone Encounter (Signed)
Pt is calling to follow up on the medication refill. Pt stated she has been without her medication for about 3 days.    Please advise.

## 2022-06-29 ENCOUNTER — Inpatient Hospital Stay: Payer: Medicare Other | Admitting: Internal Medicine

## 2022-06-30 DIAGNOSIS — M25552 Pain in left hip: Secondary | ICD-10-CM | POA: Diagnosis not present

## 2022-06-30 DIAGNOSIS — Z79899 Other long term (current) drug therapy: Secondary | ICD-10-CM | POA: Diagnosis not present

## 2022-06-30 DIAGNOSIS — E119 Type 2 diabetes mellitus without complications: Secondary | ICD-10-CM | POA: Diagnosis not present

## 2022-06-30 DIAGNOSIS — Z9181 History of falling: Secondary | ICD-10-CM | POA: Diagnosis not present

## 2022-06-30 DIAGNOSIS — Z6831 Body mass index (BMI) 31.0-31.9, adult: Secondary | ICD-10-CM | POA: Diagnosis not present

## 2022-06-30 DIAGNOSIS — Z794 Long term (current) use of insulin: Secondary | ICD-10-CM | POA: Diagnosis not present

## 2022-06-30 DIAGNOSIS — Z0130 Encounter for examination of blood pressure without abnormal findings: Secondary | ICD-10-CM | POA: Diagnosis not present

## 2022-06-30 DIAGNOSIS — M25551 Pain in right hip: Secondary | ICD-10-CM | POA: Diagnosis not present

## 2022-06-30 DIAGNOSIS — F17200 Nicotine dependence, unspecified, uncomplicated: Secondary | ICD-10-CM | POA: Diagnosis not present

## 2022-07-04 DIAGNOSIS — Z79899 Other long term (current) drug therapy: Secondary | ICD-10-CM | POA: Diagnosis not present

## 2022-07-07 ENCOUNTER — Inpatient Hospital Stay: Payer: Medicare Other | Admitting: Family Medicine

## 2022-07-07 NOTE — Progress Notes (Deleted)
      Established patient visit   [name]   DOB: [birth year]   62 y.o. Female  MRN: [id number] Visit Date: 07/07/2022  Today's healthcare provider: Lelon Huh, MD   No chief complaint on file.  Subjective    HPI  Follow up Hospitalization  Patient was admitted to [facility] on 06/11/2022 and discharged on 06/15/2022. She was treated for Sepsis likely due to UTI and aspiration pneumonia.. Treatment for this included IV Cefepime and discharged with Keflex.  She reports {excellent/good/fair:19992} compliance with treatment. She reports this condition is {resolved/improved/worsened:23923}.  ----------------------------------------------------------------------------------------- -   Medications: Outpatient Medications Prior to Visit  Medication Sig   amphetamine-dextroamphetamine (ADDERALL) 30 MG tablet Take 1 tablet by mouth 2 (two) times daily.   Aspirin 81 MG CAPS Take 81 mg by mouth daily. Reported on 07/30/2015   blood glucose meter kit and supplies KIT Dispense based on patient and insurance preference. Use to monitor blood sugars once daily as directed.   Blood Glucose Monitoring Suppl (ACCU-CHEK AVIVA PLUS) w/Device KIT Use to check blood sugar daily for type 2 diabetes. E11.9   diclofenac Sodium (VOLTAREN) 1 % GEL Apply 2-4 g topically 4 (four) times daily.   gabapentin (NEURONTIN) 400 MG capsule Take 400 mg by mouth at bedtime as needed.   glucose blood (ACCU-CHEK AVIVA PLUS) test strip Use to check blood sugar once a day   insulin glargine (LANTUS SOLOSTAR) 100 UNIT/ML Solostar Pen INJECT 80 UNITS INTO THE SKIN 2 (TWO) TIMES DAILY. OFFICE VISIT NEEDED FOR ADDITIONAL REFILLS   Insulin Syringe 27G X 1/2" 1 ML MISC 1 Units by Does not apply route daily. For insulin dependent type 2 diabetes   methadone (DOLOPHINE) 10 MG tablet Limit 5-7 tabs by mouth per day if tolerated (Patient taking differently: Take 30 mg by mouth daily. Limit 5-7 tabs by mouth per day if  tolerated)   NARCAN 4 MG/0.1ML LIQD nasal spray kit    Oxycodone HCl 10 MG TABS Take 10 mg by mouth every 4 (four) hours.   No facility-administered medications prior to visit.    Review of Systems  {Labs  Heme  Chem  Endocrine  Serology  Results Review (optional):23779}   Objective    There were no vitals taken for this visit. {Show previous vital signs (optional):23777}  Physical Exam  ***  No results found for any visits on 07/07/22.  Assessment & Plan     ***  No follow-ups on file.      {provider attestation***:1}   Lelon Huh, MD  [place] [phone] (phone) [phone] (fax)  [place]

## 2022-07-19 ENCOUNTER — Telehealth: Payer: Self-pay | Admitting: Family Medicine

## 2022-07-19 DIAGNOSIS — E1165 Type 2 diabetes mellitus with hyperglycemia: Secondary | ICD-10-CM

## 2022-07-19 MED ORDER — TOUJEO MAX SOLOSTAR 300 UNIT/ML ~~LOC~~ SOPN: 80.0000 [IU] | PEN_INJECTOR | Freq: Two times a day (BID) | SUBCUTANEOUS | 0 refills | Status: DC

## 2022-07-19 NOTE — Telephone Encounter (Signed)
Please advise patient we got a letter stating Lantus is no longer covered, but toujeo is. [facility] Should take same dose as she did of the Lantus.Is overdue for follow up and need to schedule appt sometime within the next mont.

## 2022-07-19 NOTE — Telephone Encounter (Signed)
NA. Voicemail not set up.

## 2022-07-24 ENCOUNTER — Other Ambulatory Visit: Payer: Self-pay | Admitting: Family Medicine

## 2022-07-24 DIAGNOSIS — E1165 Type 2 diabetes mellitus with hyperglycemia: Secondary | ICD-10-CM

## 2022-07-24 DIAGNOSIS — Z794 Long term (current) use of insulin: Secondary | ICD-10-CM

## 2022-07-25 ENCOUNTER — Ambulatory Visit (INDEPENDENT_AMBULATORY_CARE_PROVIDER_SITE_OTHER): Payer: Medicare Other | Admitting: Family Medicine

## 2022-07-25 VITALS — BP 153/64 | HR 90 | Temp 96.8°F | Ht 66.0 in | Wt 199.0 lb

## 2022-07-25 DIAGNOSIS — I10 Essential (primary) hypertension: Secondary | ICD-10-CM | POA: Diagnosis not present

## 2022-07-25 DIAGNOSIS — R809 Proteinuria, unspecified: Secondary | ICD-10-CM

## 2022-07-25 DIAGNOSIS — Z794 Long term (current) use of insulin: Secondary | ICD-10-CM | POA: Diagnosis not present

## 2022-07-25 DIAGNOSIS — E1129 Type 2 diabetes mellitus with other diabetic kidney complication: Secondary | ICD-10-CM | POA: Diagnosis not present

## 2022-07-25 DIAGNOSIS — E1165 Type 2 diabetes mellitus with hyperglycemia: Secondary | ICD-10-CM | POA: Diagnosis not present

## 2022-07-25 MED ORDER — RYBELSUS 3 MG PO TABS: 3.0000 mg | ORAL_TABLET | Freq: Every day | ORAL | 0 refills | Status: DC

## 2022-07-25 MED ORDER — TOUJEO MAX SOLOSTAR 300 UNIT/ML ~~LOC~~ SOPN: 80.0000 [IU] | PEN_INJECTOR | Freq: Two times a day (BID) | SUBCUTANEOUS | 1 refills | Status: DC

## 2022-07-25 NOTE — Telephone Encounter (Signed)
Requested by interface surescripts. Medication dose discontinued 07/10/22.  Requested Prescriptions  Refused Prescriptions Disp Refills   LANTUS SOLOSTAR 100 UNIT/ML Solostar Pen [Pharmacy Med Name: LANTUS SOLOSTAR 100 UNIT/ML]      Sig: INJECT 80 UNITS INTO THE SKIN 2 (TWO) TIMES DAILY. OFFICE VISIT NEEDED FOR ADDITIONAL REFILLS     Endocrinology:  Diabetes - Insulins Failed - 07/24/2022 11:46 AM      Failed - HBA1C is between 0 and 7.9 and within 180 days    Hgb A1c MFr Bld  Date Value Ref Range Status  06/12/2022 8.9 (H) 4.8 - 5.6 % Final    Comment:    (NOTE) Pre diabetes:          5.7%-6.4%  Diabetes:              >6.4%  Glycemic control for   <7.0% adults with diabetes          Failed - Valid encounter within last 6 months    Recent Outpatient Visits           Today Type 2 diabetes mellitus with microalbuminuria, with long-term current use of insulin ([place])   [place] Birdie Sons, MD   10 months ago Diabetes mellitus without complication [facility])   [place], [place], [name]   1 year ago Type 2 diabetes mellitus with hyperglycemia, with long-term current use of insulin ([place])   [place], Dani Gob[place], [name]   3 years ago Type 2 diabetes mellitus with diabetic neuropathy, with long-term current use of insulin ([place])   [place], Donald E, MD   4 years ago Type 2 diabetes mellitus with diabetic neuropathy, with long-term current use of insulin ([place])   [place], Donald E, MD       Future Appointments             In 4 weeks Fisher, Kirstie Peri, MD [facility]

## 2022-07-25 NOTE — Patient Instructions (Addendum)
Please review the attached list of medications and notify my office if there are any errors.   Start samples of Rybelsus by taking 1 (one) '3mg'$  tablet once every day

## 2022-07-25 NOTE — Telephone Encounter (Signed)
Patient in office for visit

## 2022-07-25 NOTE — Progress Notes (Signed)
[name],acting as a scribe for Lelon Huh, MD.,have documented all relevant documentation on the behalf of Lelon Huh, MD,as directed by  Lelon Huh, MD while in the presence of Lelon Huh, MD.     Established patient visit   [name]   DOB: [birth year]   62 y.o. Female  MRN: [id number] Visit Date: 07/25/2022  Today's healthcare provider: Lelon Huh, MD   Chief Complaint  Patient presents with   Diabetes   Subjective    HPI  Diabetes Mellitus Type II, Follow-up  Lab Results  Component Value Date   HGBA1C 8.9 (H) 06/12/2022   HGBA1C 7.4 (A) 09/02/2021   HGBA1C 11.8 (A) 05/06/2021   Wt Readings from Last 3 Encounters:  07/25/22 199 lb (90.3 kg)  06/11/22 195 lb 1.7 oz (88.5 kg)  11/04/21 195 lb (88.5 kg)   Last seen for diabetes 10 months ago.  Management since then includes patient was advised to check glucose, brig medication to visit and return in 1 week. She reports fair compliance with treatment.  Patient ran out of her medication and has been without for 3 days She is not having side effects.  Symptoms: Yes fatigue No foot ulcerations  No appetite changes Yes nausea  No paresthesia of the feet  No polydipsia  No polyuria No visual disturbances   Yes vomiting     Home blood sugar records:  150-200  Episodes of hypoglycemia? Yes twice in the last month, glucose was not checked   Current insulin regiment: none currently-Patient did not start on the Toujeo Most Recent Eye Exam: greater than a few years Current exercise: none Current diet habits: nothing specific  Pertinent Labs: Lab Results  Component Value Date   CHOL 176 05/27/2021   HDL 30 (L) 05/27/2021   LDLCALC 118 (H) 05/27/2021   TRIG 157 (H) 05/27/2021   CHOLHDL 5.9 (H) 05/27/2021   Lab Results  Component Value Date   NA 136 06/13/2022   K 4.0 06/13/2022   CREATININE 0.76 06/13/2022   GFRNONAA >60 06/13/2022   LABMICR 664.0 05/27/2021   MICRALBCREAT 547  (H) 05/27/2021     --------------------------------------------------------------------------------------------------- Hypertension, follow-up  BP Readings from Last 3 Encounters:  07/25/22 (!) 153/64  06/15/22 (!) 157/69  11/05/21 129/73   Wt Readings from Last 3 Encounters:  07/25/22 199 lb (90.3 kg)  06/11/22 195 lb 1.7 oz (88.5 kg)  11/04/21 195 lb (88.5 kg)     She was last seen for hypertension 10 months ago.  BP at that visit was 136/57. Management since that visit includes patient was asked to take her blood pressure medication but patient declined.  She reports  not on any medication   She does smoke.  Use of agents associated with hypertension: amphetamines.   Outside blood pressures are not being checked. Symptoms: No chest pain No chest pressure  No palpitations No syncope  No dyspnea No orthopnea  No paroxysmal nocturnal dyspnea No lower extremity edema   Pertinent labs Lab Results  Component Value Date   CHOL 176 05/27/2021   HDL 30 (L) 05/27/2021   LDLCALC 118 (H) 05/27/2021   TRIG 157 (H) 05/27/2021   CHOLHDL 5.9 (H) 05/27/2021   Lab Results  Component Value Date   NA 136 06/13/2022   K 4.0 06/13/2022   CREATININE 0.76 06/13/2022   GFRNONAA >60 06/13/2022   GLUCOSE 275 (H) 06/13/2022   TSH 1.770 05/05/2019     The 10-year ASCVD risk score (  Arnett DK, et al., 2019) is: 35.1%  ---------------------------------------------------------------------------------------------------   Medications: Outpatient Medications Prior to Visit  Medication Sig   amphetamine-dextroamphetamine (ADDERALL) 30 MG tablet Take 1 tablet by mouth 2 (two) times daily.   Aspirin 81 MG CAPS Take 81 mg by mouth daily. Reported on 07/30/2015   blood glucose meter kit and supplies KIT Dispense based on patient and insurance preference. Use to monitor blood sugars once daily as directed.   Blood Glucose Monitoring Suppl (ACCU-CHEK AVIVA PLUS) w/Device KIT Use to check blood  sugar daily for type 2 diabetes. E11.9   diclofenac Sodium (VOLTAREN) 1 % GEL Apply 2-4 g topically 4 (four) times daily.   gabapentin (NEURONTIN) 400 MG capsule Take 400 mg by mouth at bedtime as needed.   glucose blood (ACCU-CHEK AVIVA PLUS) test strip Use to check blood sugar once a day   insulin glargine, 2 Unit Dial, (TOUJEO MAX SOLOSTAR) 300 UNIT/ML Solostar Pen Inject 80 Units into the skin 2 (two) times daily.   Insulin Syringe 27G X 1/2" 1 ML MISC 1 Units by Does not apply route daily. For insulin dependent type 2 diabetes   methadone (DOLOPHINE) 10 MG tablet Limit 5-7 tabs by mouth per day if tolerated (Patient taking differently: Take 30 mg by mouth daily. Limit 5-7 tabs by mouth per day if tolerated)   NARCAN 4 MG/0.1ML LIQD nasal spray kit    Oxycodone HCl 10 MG TABS Take 10 mg by mouth every 4 (four) hours.   No facility-administered medications prior to visit.        Objective    BP (!) 153/64 (BP Location: Right Arm, Patient Position: Sitting, Cuff Size: Large)   Pulse 90   Temp (!) 96.8 F (36 C) (Oral)   Ht '5\' 6"'$  (1.676 m)   Wt 199 lb (90.3 kg)   SpO2 96%   BMI 32.12 kg/m  Vitals:   07/25/22 1011 07/25/22 1012  BP: (!) 162/70 (!) 153/64  Pulse: 90   Temp: (!) 96.8 F (36 C)   TempSrc: Oral   SpO2: 96%   Weight: 199 lb (90.3 kg)   Height: '5\' 6"'$  (1.676 m)      Physical Exam  General appearance: Mildly obese female, cooperative and in no acute distress Head: Normocephalic, without obvious abnormality, atraumatic Respiratory: Respirations even and unlabored, normal respiratory rate Extremities: All extremities are intact.  Skin: Skin color, texture, turgor normal. No rashes seen  Psych: Appropriate mood and affect. Neurologic: Mental status: Alert, oriented to person, place, and time, thought content appropriate.    Assessment & Plan     1. Type 2 diabetes mellitus with hyperglycemia, with long-term current use of insulin (HCC) Continue insulin  glargine, 2 Unit Dial, (TOUJEO MAX SOLOSTAR) 300 UNIT/ML Solostar Pen; Inject 80 Units into the skin 2 (two) times daily.  Dispense: 15 mL; Refill: 1  Add samples  Semaglutide (RYBELSUS) 3 MG TABS; Take 1 tablet (3 mg total) by mouth daily.  Dispense: 30 tablet; Refill: 0   2. Type 2 diabetes mellitus with microalbuminuria, with long-term current use of insulin (HCC)   3. Primary hypertension Will discuss initiation of ARB at follow up visit in 4 weeks.        The entirety of the information documented in the History of Present Illness, Review of Systems and Physical Exam were personally obtained by me. Portions of this information were initially documented by the CMA and reviewed by me for thoroughness and accuracy.  Lelon Huh, MD  [place] [phone] (phone) [phone] (fax)  [place]

## 2022-07-28 DIAGNOSIS — Z79899 Other long term (current) drug therapy: Secondary | ICD-10-CM | POA: Diagnosis not present

## 2022-08-01 DIAGNOSIS — Z79899 Other long term (current) drug therapy: Secondary | ICD-10-CM | POA: Diagnosis not present

## 2022-08-23 ENCOUNTER — Ambulatory Visit: Payer: Medicare Other | Admitting: Family Medicine

## 2022-08-23 NOTE — Progress Notes (Deleted)
      Established patient visit   [name]   DOB: 07-Jul-1960   62 y.o. Female  MRN: [id number] Visit Date: 08/23/2022  Today's healthcare provider: Lelon Huh, MD   No chief complaint on file.  Subjective    HPI Patient comes in for 4 week follow-up DMT2 and HTN. T2DM - Checking BG at home: *** - Medications: insulin glargine Inject 80 Units into the skin 2 (two) times daily. Semaglutide (RYBELSUS) 3 MG TABS. - Compliance: *** - Diet: *** - Exercise: *** - eye exam: *** - foot exam: *** - microalbumin: *** - denies symptoms of hypoglycemia, polyuria, polydipsia, numbness extremities, foot ulcers/trauma   HTN: - Medications: none. Management from 4 weeks ago include discuss initiation of ARB at follow up visit in 4 weeks.  - Checking BP at home: *** - Denies any SOB, CP, vision changes, LE edema, medication SEs, or symptoms of hypotension - Diet: *** - Exercise: ***   Medications: Outpatient Medications Prior to Visit  Medication Sig   amphetamine-dextroamphetamine (ADDERALL) 30 MG tablet Take 1 tablet by mouth 2 (two) times daily.   Aspirin 81 MG CAPS Take 81 mg by mouth daily. Reported on 07/30/2015   blood glucose meter kit and supplies KIT Dispense based on patient and insurance preference. Use to monitor blood sugars once daily as directed.   Blood Glucose Monitoring Suppl (ACCU-CHEK AVIVA PLUS) w/Device KIT Use to check blood sugar daily for type 2 diabetes. E11.9   diclofenac Sodium (VOLTAREN) 1 % GEL Apply 2-4 g topically 4 (four) times daily.   gabapentin (NEURONTIN) 400 MG capsule Take 400 mg by mouth at bedtime as needed.   glucose blood (ACCU-CHEK AVIVA PLUS) test strip Use to check blood sugar once a day   insulin glargine, 2 Unit Dial, (TOUJEO MAX SOLOSTAR) 300 UNIT/ML Solostar Pen Inject 80 Units into the skin 2 (two) times daily.   Insulin Syringe 27G X 1/2" 1 ML MISC 1 Units by Does not apply route daily. For insulin dependent type 2 diabetes    methadone (DOLOPHINE) 10 MG tablet Limit 5-7 tabs by mouth per day if tolerated (Patient taking differently: Take 30 mg by mouth daily. Limit 5-7 tabs by mouth per day if tolerated)   NARCAN 4 MG/0.1ML LIQD nasal spray kit    Oxycodone HCl 10 MG TABS Take 10 mg by mouth every 4 (four) hours.   Semaglutide (RYBELSUS) 3 MG TABS Take 1 tablet (3 mg total) by mouth daily.   No facility-administered medications prior to visit.    Review of Systems  {Labs  Heme  Chem  Endocrine  Serology  Results Review (optional):23779}   Objective    There were no vitals taken for this visit. {Show previous vital signs (optional):23777}  Physical Exam  ***  No results found for any visits on 08/23/22.  Assessment & Plan     ***  No follow-ups on file.      {provider attestation***:1}   Lelon Huh, MD  [place] [phone] (phone) [phone] (fax)  [place]

## 2022-08-24 DIAGNOSIS — Z79899 Other long term (current) drug therapy: Secondary | ICD-10-CM | POA: Diagnosis not present

## 2022-08-30 DIAGNOSIS — Z79899 Other long term (current) drug therapy: Secondary | ICD-10-CM | POA: Diagnosis not present

## 2022-09-03 DIAGNOSIS — R739 Hyperglycemia, unspecified: Secondary | ICD-10-CM | POA: Diagnosis not present

## 2022-09-03 DIAGNOSIS — I499 Cardiac arrhythmia, unspecified: Secondary | ICD-10-CM | POA: Diagnosis not present

## 2022-09-03 DIAGNOSIS — R000 Tachycardia, unspecified: Secondary | ICD-10-CM | POA: Diagnosis not present

## 2022-09-03 DIAGNOSIS — I4901 Ventricular fibrillation: Secondary | ICD-10-CM | POA: Diagnosis not present

## 2022-09-05 ENCOUNTER — Telehealth: Payer: Self-pay

## 2022-09-05 NOTE — Telephone Encounter (Signed)
Copied from CRM [id number]. Topic: General - Deceased Patient >> 2022-09-13 11:10 AM Macon Large wrote: Reason for CRM: Joni Reining with [facility] reports that patient passed away and her cornea will be donated. Joni Reining requests call back asap as the cornea has a short shelf life. Cb# [phone] Ext. [phone]

## 2022-09-11 ENCOUNTER — Telehealth: Payer: Self-pay

## 2022-09-11 NOTE — Telephone Encounter (Signed)
Copied from CRM [id number]. Topic: General - Other >> Sep 11, 2022  9:22 AM Dondra Prader A wrote: Reason for CRM: Sep 22, 2022 with [facility] states that PCP signed the death certificate but did not date it. Per 09/22/22 if PCP can date the death certificate and send it back to her, it will pop up in her system.

## 2022-09-11 NOTE — Telephone Encounter (Signed)
I don't know anything about this patients death. I got a call about harvesting her eyes, but otherwise nobody has notified me of her death, the circumstances of her death, time of death, or any other information that I can use to fill out her death certificate.

## 2022-09-13 NOTE — Telephone Encounter (Signed)
Per [employer] [facility] they were advised that patient's death was not a medical examiner case. Time of death was reports at 8:14 am on 09/18/2022

## 2022-09-27 DIAGNOSIS — 419620001 Death: Secondary | SNOMED CT | POA: Diagnosis not present

## 2022-09-27 DEATH — deceased
